# Patient Record
Sex: Female | Born: 1995 | Race: White | Hispanic: Yes | Marital: Single | Smoking: Never smoker
Health system: Southern US, Community
[De-identification: ages and names within clinical notes are randomized; demographics above are authoritative.]

## PROBLEM LIST (undated history)

## (undated) DIAGNOSIS — F29 Unspecified psychosis not due to a substance or known physiological condition: Secondary | ICD-10-CM

## (undated) DIAGNOSIS — F419 Anxiety disorder, unspecified: Secondary | ICD-10-CM

## (undated) DIAGNOSIS — F32A Depression, unspecified: Secondary | ICD-10-CM

## (undated) DIAGNOSIS — F329 Major depressive disorder, single episode, unspecified: Secondary | ICD-10-CM

## (undated) DIAGNOSIS — R5383 Other fatigue: Secondary | ICD-10-CM

## (undated) DIAGNOSIS — R519 Headache, unspecified: Secondary | ICD-10-CM

## (undated) DIAGNOSIS — R51 Headache: Secondary | ICD-10-CM

## (undated) HISTORY — DX: Major depressive disorder, single episode, unspecified: F32.9

## (undated) HISTORY — DX: Headache: R51

## (undated) HISTORY — DX: Headache, unspecified: R51.9

## (undated) HISTORY — DX: Anxiety disorder, unspecified: F41.9

## (undated) HISTORY — DX: Depression, unspecified: F32.A

## (undated) HISTORY — DX: Other fatigue: R53.83

---
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
## 2017-02-24 ENCOUNTER — Encounter (HOSPITAL_COMMUNITY): Payer: Self-pay | Admitting: *Deleted

## 2017-02-24 ENCOUNTER — Ambulatory Visit (INDEPENDENT_AMBULATORY_CARE_PROVIDER_SITE_OTHER): Payer: Managed Care, Other (non HMO) | Admitting: Medical

## 2017-02-24 ENCOUNTER — Encounter (HOSPITAL_COMMUNITY): Payer: Self-pay | Admitting: Medical

## 2017-02-24 VITALS — BP 116/70 | HR 94 | Resp 16 | Ht 60.5 in | Wt 163.0 lb

## 2017-02-24 DIAGNOSIS — F23 Brief psychotic disorder: Secondary | ICD-10-CM

## 2017-02-24 DIAGNOSIS — Z79899 Other long term (current) drug therapy: Secondary | ICD-10-CM | POA: Diagnosis not present

## 2017-02-24 NOTE — Progress Notes (Addendum)
[place] MD/PA/NP OP Progress Note  02/24/2017 5:52 PM Valerie [name]  MRN:  [id number]  Subjective:" I'm doing good"  Chief Complaint:  Chief Complaint    Follow-up; Schizoaffective DO     Visit Diagnosis:     ICD-9-CM ICD-10-CM   1. 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 Anxiety   . Depression   . Fatigue   . Headache    No past surgical history on file. Family History:   2 Paternal second cousins with autism Maternal 1st cousin with autism  Paternal 1st cousin with a tumor in his neck,     Social History  . Marital Status: Single    Spouse Name: N/A  . Number of Children: N/A  . Years of Education: N/A   Social History Main Topics  . Smoking status: Never Smoker   . Smokeless tobacco: None  . Alcohol Use: No  . Drug Use: No  . Sexual Activity: No   Other Topics Concern  . None   Social History Narrative   Social History:  Social History   Social History  . Marital status: Single    Spouse name: N/A  . Number of children: N/A  . Years of education: N/A   Social History Main Topics  . Smoking status: Never Smoker  . Smokeless tobacco: Never Used  . Alcohol use No  . Drug use: No  . Sexual activity: No   Other Topics Concern  . None   Social History Narrative  . None   Additional History:  Going to 11th grade at [employer] History:Noncontributory Prenatal History:   Birth History:   Postnatal Infancy:   Developmental History:   Milestones:  Sit-Up:    Crawl:   Walk:   Speech: School History: patient is behind 1 grade,   Psychiatric Specialty Exam: HPI : At last visit pt was here with her mother and there were no new psychiatric complaints/problems.She continued to do well in school and at home. She is going in her Senior yr in [place].She was having no extrapyramidal side effects.Wgt is stable She had tapered off her meds as directed  and was completely asymptomatic Continues ON OBCP So far pt has not had anproblems/advrse reaction   ROS No change Psychiatric/Behavioral: Positive for anxiety resolved  and  Hallucinations have stopped  No suicidal ideas, memory loss and substance abuse. The patient is not nervous/anxious and per HPI has stopped medications General: + for cessation of wgt gain.   All other systems reviewed and are negative.   Blood pressure 116/70, pulse 94, resp. rate 16, height 5' 0.5" (1.537 m), weight 163 lb (73.9 kg), SpO2 97 %.Body mass index is 31.31 kg/m.  General Appearance: Neat Mask like facies has disappered  Eye Contact:  Good  Speech:  Clear and Coherent  Volume:  Normal  Mood:  Happy/relaxed  Affect:  Appropriate and Congruent  Thought Process:  Coherent and Descriptions of Associations: Intact  Orientation:  Full (Time, Place, and Person)  Thought Content:  No SI/HI;Hallucination;Ideas of reference;Paranoia  Suicidal Thoughts:  No  Homicidal Thoughts:  No  Memory:  Negative Intact  Judgement:  Intact  Insight:  Fair  Psychomotor Activity:  Normal  Concentration:  Good  Recall:  Good  Fund of Knowledge: Good  Language: Good  Akathisia:  No  Handed:  Right  AIMS (if indicated):  Negative  Assets:  Desire for Improvement Financial Resources/Insurance Housing Social Support Transportation Vocational/Educational  ADL's:  Intact  Cognition: WNL  Sleep:  Normal   Musculoskeletal: Strength & Muscle Tone: within normal limits Gait & Station: normal Patient leans: N/A   Current Outpatient Prescriptions  Medication Sig Dispense Refill  . SPRINTEC 28 0.25-35 MG-MCG tablet     . ARIPiprazole (ABILIFY) 15 MG tablet Take 1 tablet at bedtime. (Patient not taking: Reported on 12/02/2016) 90 tablet 1  . escitalopram (LEXAPRO) 10 MG tablet Take 1 tablet (10 mg total) by mouth daily. (Patient not taking: Reported on 12/02/2016) 90 tablet 1   No current facility-administered medications  for this visit.     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

## 2017-05-06 ENCOUNTER — Telehealth (HOSPITAL_COMMUNITY): Payer: Self-pay | Admitting: Medical

## 2017-05-06 ENCOUNTER — Other Ambulatory Visit (HOSPITAL_COMMUNITY): Payer: Self-pay | Admitting: Medical

## 2017-05-06 DIAGNOSIS — F22 Delusional disorders: Secondary | ICD-10-CM

## 2017-05-06 NOTE — Telephone Encounter (Signed)
Pts medications was discontinued on last visit.  Mother is calling back requesting for a refill on medications because "pt is not acting normal"  Pt is not back in state until 7/23.   Please advise.

## 2017-05-06 NOTE — Telephone Encounter (Signed)
Pt needs to be seen Take to Psych ED for eval

## 2017-05-09 ENCOUNTER — Other Ambulatory Visit (HOSPITAL_COMMUNITY): Payer: Self-pay | Admitting: Medical

## 2017-05-09 NOTE — Progress Notes (Signed)
Pt referred to Psych ED

## 2017-05-09 NOTE — Telephone Encounter (Signed)
Lm for mom informing her of the following. Nothing further needed at this time.

## 2017-06-02 ENCOUNTER — Encounter (HOSPITAL_COMMUNITY): Payer: Self-pay | Admitting: *Deleted

## 2017-06-02 ENCOUNTER — Encounter (HOSPITAL_COMMUNITY): Payer: Self-pay | Admitting: Medical

## 2017-06-02 ENCOUNTER — Inpatient Hospital Stay (HOSPITAL_COMMUNITY)
Admission: RE | Admit: 2017-06-02 | Discharge: 2017-06-10 | DRG: 885 | Disposition: A | Discharge status: Home / Self Care | Payer: No Typology Code available for payment source | Attending: Psychiatry | Admitting: Psychiatry

## 2017-06-02 ENCOUNTER — Ambulatory Visit (INDEPENDENT_AMBULATORY_CARE_PROVIDER_SITE_OTHER): Payer: 59 | Admitting: Medical

## 2017-06-02 DIAGNOSIS — F419 Anxiety disorder, unspecified: Secondary | ICD-10-CM | POA: Diagnosis present

## 2017-06-02 DIAGNOSIS — F22 Delusional disorders: Secondary | ICD-10-CM

## 2017-06-02 DIAGNOSIS — Z818 Family history of other mental and behavioral disorders: Secondary | ICD-10-CM | POA: Diagnosis not present

## 2017-06-02 DIAGNOSIS — F29 Unspecified psychosis not due to a substance or known physiological condition: Secondary | ICD-10-CM | POA: Diagnosis not present

## 2017-06-02 DIAGNOSIS — F513 Sleepwalking [somnambulism]: Secondary | ICD-10-CM | POA: Diagnosis not present

## 2017-06-02 DIAGNOSIS — Z810 Family history of intellectual disabilities: Secondary | ICD-10-CM

## 2017-06-02 DIAGNOSIS — G4700 Insomnia, unspecified: Secondary | ICD-10-CM | POA: Diagnosis present

## 2017-06-02 DIAGNOSIS — F429 Obsessive-compulsive disorder, unspecified: Secondary | ICD-10-CM | POA: Diagnosis not present

## 2017-06-02 DIAGNOSIS — F332 Major depressive disorder, recurrent severe without psychotic features: Secondary | ICD-10-CM | POA: Diagnosis present

## 2017-06-02 DIAGNOSIS — Z8659 Personal history of other mental and behavioral disorders: Secondary | ICD-10-CM | POA: Diagnosis not present

## 2017-06-02 DIAGNOSIS — Z833 Family history of diabetes mellitus: Secondary | ICD-10-CM

## 2017-06-02 DIAGNOSIS — F819 Developmental disorder of scholastic skills, unspecified: Secondary | ICD-10-CM | POA: Diagnosis present

## 2017-06-02 DIAGNOSIS — Z658 Other specified problems related to psychosocial circumstances: Secondary | ICD-10-CM | POA: Diagnosis not present

## 2017-06-02 DIAGNOSIS — F333 Major depressive disorder, recurrent, severe with psychotic symptoms: Secondary | ICD-10-CM | POA: Diagnosis present

## 2017-06-02 DIAGNOSIS — Z8249 Family history of ischemic heart disease and other diseases of the circulatory system: Secondary | ICD-10-CM | POA: Diagnosis not present

## 2017-06-02 DIAGNOSIS — K219 Gastro-esophageal reflux disease without esophagitis: Secondary | ICD-10-CM | POA: Diagnosis present

## 2017-06-02 DIAGNOSIS — K5900 Constipation, unspecified: Secondary | ICD-10-CM | POA: Diagnosis present

## 2017-06-02 HISTORY — DX: Unspecified psychosis not due to a substance or known physiological condition: F29

## 2017-06-02 MED ORDER — RISPERIDONE 0.5 MG PO TBDP
0.5000 mg | ORAL_TABLET | Freq: Every day | ORAL | Status: DC
  Filled 2017-06-02 (×4): qty 1

## 2017-06-02 MED ORDER — MAGNESIUM HYDROXIDE 400 MG/5ML PO SUSP
5.0000 mL | Freq: Every evening | ORAL | Status: DC | PRN
  Administered 2017-06-06: 5 mL via ORAL
  Filled 2017-06-02: qty 30

## 2017-06-02 MED ORDER — ALUM & MAG HYDROXIDE-SIMETH 200-200-20 MG/5ML PO SUSP: 30.0000 mL | Freq: Four times a day (QID) | ORAL | Status: DC | PRN

## 2017-06-02 MED ORDER — NORGESTIMATE-ETH ESTRADIOL 0.25-35 MG-MCG PO TABS
1.0000 | ORAL_TABLET | Freq: Every day | ORAL | Status: DC
  Administered 2017-06-02 – 2017-06-09 (×8): 1 via ORAL

## 2017-06-02 NOTE — H&P (Signed)
Behavioral Health Medical Screening Exam  Valerie [name] is an 21 y.o. female.  Total Time spent with patient: 20 minutes  Psychiatric Specialty Exam: Physical Exam  Nursing note and vitals reviewed. Constitutional: She is oriented to person, place, and time. She appears well-developed and well-nourished.  Cardiovascular: Normal rate.   Respiratory: Effort normal.  Musculoskeletal: Normal range of motion.  Neurological: She is alert and oriented to person, place, and time.  Skin: Skin is warm.    Review of Systems  HENT: Negative.   Eyes: Negative.   Respiratory: Negative.   Cardiovascular: Negative.   Gastrointestinal: Negative.   Genitourinary: Negative.   Musculoskeletal: Negative.   Skin: Negative.   Neurological: Negative.   Endo/Heme/Allergies: Negative.     Blood pressure 119/75, pulse 99, temperature 98.9 F (37.2 C), SpO2 100 %.There is no height or weight on file to calculate BMI.  General Appearance: Casual  Eye Contact:  Good  Speech:  Clear and Coherent and Normal Rate  Volume:  Decreased  Mood:  Depressed  Affect:  Flat  Thought Process:  Coherent and Descriptions of Associations: Intact  Orientation:  Full (Time, Place, and Person)  Thought Content:  Hallucinations: Auditory Visual  Suicidal Thoughts:  No  Homicidal Thoughts:  No  Memory:  Immediate;   Fair Recent;   Fair  Judgement:  Fair  Insight:  Lacking  Psychomotor Activity:  Normal  Concentration: Concentration: Fair  Recall:  Good  Fund of Knowledge:Fair  Language: Good  Akathisia:  No  Handed:  Right  AIMS (if indicated):     Assets:  Financial Resources/Insurance Housing Social Support  Sleep:       Musculoskeletal: Strength & Muscle Tone: within normal limits Gait & Station: normal Patient leans: N/A  Blood pressure 119/75, pulse 99, temperature 98.9 F (37.2 C), SpO2 100 %.  Recommendations:  Based on my evaluation the patient does not appear to have an emergency medical  condition.  [place], FNP 06/02/2017, 3:59 PM

## 2017-06-02 NOTE — Tx Team (Signed)
Initial Treatment Plan 06/02/2017 5:57 PM Danna Sewell ZHG:[id number]    PATIENT STRESSORS: Loss of brother, 6 years ago   PATIENT STRENGTHS: Average or above average intelligence Special hobby/interest   PATIENT IDENTIFIED PROBLEMS: None per patient, states she is 20 and doesn't need to be here. Mom and Dad report change in sleep, poor sleep, and doesn't talk much and doesn't make sense.                     DISCHARGE CRITERIA:  Ability to meet basic life and health needs Improved stabilization in mood, thinking, and/or behavior Need for constant or close observation no longer present  PRELIMINARY DISCHARGE PLAN: Outpatient therapy  PATIENT/FAMILY INVOLVEMENT: This treatment plan has been presented to and reviewed with the patient, Trezure Cronk.  The patient and family have been given the opportunity to ask questions and make suggestions.  Davina Poke, RN 06/02/2017, 5:57 PM

## 2017-06-02 NOTE — Progress Notes (Signed)
Admission Note-Walk in accompanied by her parents after seeing her outpatient provider today and he recommended she come here to get on her medications again. Unclear what the medication is, patient nor parents could recall. She has been off of it for 2 months because the parents thought she was doing well and she had gained quite a lot of weight on it and she was unhappy about the weight gain. Parents provided most of the information as she was unable to clearly express self. She spoke in fragments only, and content was tangential. She initially states when asked why she was here she said she was 8 and didn't think she needed to be here. She had a significant loss of a 79 yo brother who died in a car accident 6 years ago. Parents noticed a change in her behavior 2 years ago. She had been taking medication successfully for nearly two years till stopping it 2 months ago. Parents report she sleeps poorly, difficulty staying asleep and urinates frequently. She is able to contract for safety. Dad states she is more irritable than usual and is anxious. Oriented to unit, unsure of her comprehension. She has not been hospitalized previously.

## 2017-06-02 NOTE — Progress Notes (Signed)
BH MD/PA/NP OP Progress Note  06/02/2017 11:18 AM Valerie [name]  MRN:  [id number]  Subjective:" I'm doing good"  Chief Complaint:  Chief Complaint    Recurrence of paranoid Psychosis; OC behaviors related to urination    HPI: HPI : At prior visit pt was here with her mother and there were no new psychiatric complaints/problems.She continued to do well in school and at home. She is going in her Senior yr in [place].She was having no extrapyramidal side effects.Wgt is stable She had tapered off her meds as directed and was completely asymptomatic  AT LAST VISIT:Subjective:" I'm doing good"  Continues ON OBCP So far pt has not had any problems/adverse reaction  Discharged Return PRN  TODAY: Pt returns with mother and unfortunately has had another psychotic break which began 3 weeks ago in [place] while visiting. Pt's mother did call at that time and was advised to take pt to Hospital but she hasnt been to date. Mom reports daughter has begun to have vegetative signs especially around hyd giene. She has OC signs around urge to urinatwe with negative exam for UTI/Diabetes.Pt reports hearing voices. Mom says she has been paranoid about Mom and she has refused to answer the phone when parents call to check on her.She does not sleep   Past Psychiatric History: Date of Evaluation:  03/20/2015 Referral Source: Dr. Selena Batten Chief Complaint:  depression with paranoia Visit Diagnosis: No diagnosis found. History of Present Illness:: Patient and father noted that 1st episode began in November with a few days of irritability, anxiety, depression, and anger. This resolved.  She has had two more severe episodes since then between February and now where she is irritable, short tempered, appears to be responding to internal stimulation, reporting auditory hallucinations, cursing at family. Her grades have dropped from As and Bs to possibly needing Summer school. Elements:  Location:  psychosis. Quality:   acute. Severity:  severe. Timing:  on going x 6 months. Duration:  6 months. Context:  auditory command hallucinations, irritability, mood changes with paranoia.. Associated Signs/Symptoms: Depression Symptoms:  depressed mood, anhedonia, insomnia, psychomotor agitation, fatigue, difficulty concentrating, hopelessness, anxiety, panic attacks, loss of energy/fatigue, disturbed sleep, (Hypo) Manic Symptoms:  Distractibility, Hallucinations, Anxiety Symptoms:  Excessive Worry, Panic Symptoms, Psychotic Symptoms:  Hallucinations: Auditory Command:  to hurt herself, never others, has never acted on them PTSD Symptoms: Had a traumatic exposure:  her brother died suddenly from an accident when she was 65  Visit Diagnosis:     ICD-10-CM   1. 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 History of OCD (obsessive compulsive disorder) Z86.59     Past Medical History:  Past Medical History:  Diagnosis Date  . Anxiety   . Depression   . Fatigue   . Headache    No past surgical history on file. Family History:   2 Paternal second cousins with autism Maternal 1st cousin with autism  Paternal 1st cousin with a tumor in his neck,     Social History  . Marital Status: Single    Spouse Name: N/A  . Number of Children: N/A  . Years of Education: N/A   Social History Main Topics  . Smoking status: Never Smoker   . Smokeless tobacco: None  . Alcohol Use: No  . Drug Use: No  . Sexual Activity: No   Other Topics Concern  . None   Social History Narrative   Social History:  Social History   Social History  . Marital status: Single  Spouse name: N/A  . Number of children: N/A  . Years of education: N/A   Social History Main Topics  . Smoking status: Never Smoker  . Smokeless tobacco: Never Used  . Alcohol use No  . Drug use: No  . Sexual activity: No   Other Topics Concern  . None   Social History Narrative  . None   Additional History:  Going to 11th grade at  [employer] History:Noncontributory Prenatal History:   Birth History:   Postnatal Infancy:   Developmental History:   Milestones:  Sit-Up:    Crawl:   Walk:   Speech: School History: patient is behind 1 grade,   Psychiatric Specialty Exam:      There were no vitals taken for this visit.There is no height or weight on file to calculate BMI.  General Appearance: Neat, Well Groomed and Mom had to assist Mask like facies continues to be absent  Eye Contact:  Minimal  Speech:  Blocked  Volume:  Minimal speeck  Mood:  Anxious, Dysphoric   Affect:  Congruent, Constricted and Restricted  Thought Process:  , NA unable to assess   Orientation:  Other:  person only  Thought Content:  No SI/HI;Hallucination;Ideas of reference;Paranoia  Suicidal Thoughts:  unable to assess  Homicidal Thoughts:  unable to assess  Memory:  Does not recall recent events Mom reports  Judgement:  Impaired  Insight:  Lacking  Psychomotor Activity:  Decreased and Restlessness  Concentration:  Poor  Recall:  ?  Fund of Knowledge: NA  Language: NA  Akathisia:  Negative  Handed:  Right  AIMS (if indicated):  Negative  Assets:  Merchandiser, retail Vocational/Educational  ADL's:  Impaired  Cognition: WNL and Impaired,  Severe  Sleep:  Not sleeping   Musculoskeletal: Strength & Muscle Tone: within normal limits Gait & Station: normal Patient leans: N/A   Current Outpatient Prescriptions  Medication Sig Dispense Refill  . SPRINTEC 28 0.25-35 MG-MCG tablet      No current facility-administered medications for this visit.      Treatment Plan Summary: Discussed options with Mom and also briefly with pt  Mom and Dad will take pt to Walk In at [facility] for consideration of Inpatient stabilization as she is not capable of taking care of herself and does not appear to be safe left on her own.    [name] 06/02/2017, 11:18 AM
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
## 2017-06-03 ENCOUNTER — Inpatient Hospital Stay (HOSPITAL_COMMUNITY)
Admission: RE | Admit: 2017-06-03 | Discharge: 2017-06-03 | Disposition: A | Discharge status: Home / Self Care | Payer: No Typology Code available for payment source | Source: Home / Self Care | Attending: Psychiatry | Admitting: Psychiatry

## 2017-06-03 ENCOUNTER — Encounter (HOSPITAL_COMMUNITY): Payer: Self-pay | Admitting: Psychiatry

## 2017-06-03 DIAGNOSIS — F29 Unspecified psychosis not due to a substance or known physiological condition: Secondary | ICD-10-CM

## 2017-06-03 DIAGNOSIS — F513 Sleepwalking [somnambulism]: Secondary | ICD-10-CM

## 2017-06-03 DIAGNOSIS — F429 Obsessive-compulsive disorder, unspecified: Secondary | ICD-10-CM

## 2017-06-03 DIAGNOSIS — Z658 Other specified problems related to psychosocial circumstances: Secondary | ICD-10-CM

## 2017-06-03 DIAGNOSIS — F333 Major depressive disorder, recurrent, severe with psychotic symptoms: Principal | ICD-10-CM

## 2017-06-03 HISTORY — DX: Unspecified psychosis not due to a substance or known physiological condition: F29

## 2017-06-03 LAB — CBC
HCT: 38.7 % (ref 36.0–46.0)
Hemoglobin: 12.8 g/dL (ref 12.0–15.0)
MCH: 27.1 pg (ref 26.0–34.0)
MCHC: 33.1 g/dL (ref 30.0–36.0)
MCV: 81.8 fL (ref 78.0–100.0)
PLATELETS: 306 10*3/uL (ref 150–400)
RBC: 4.73 MIL/uL (ref 3.87–5.11)
RDW: 13.9 % (ref 11.5–15.5)
WBC: 14.8 10*3/uL — ABNORMAL HIGH (ref 4.0–10.5)

## 2017-06-03 LAB — COMPREHENSIVE METABOLIC PANEL
ALK PHOS: 22 U/L — AB (ref 38–126)
ALT: 15 U/L (ref 14–54)
AST: 15 U/L (ref 15–41)
Albumin: 4 g/dL (ref 3.5–5.0)
Anion gap: 9 (ref 5–15)
BUN: 7 mg/dL (ref 6–20)
CALCIUM: 9.8 mg/dL (ref 8.9–10.3)
CO2: 25 mmol/L (ref 22–32)
CREATININE: 0.59 mg/dL (ref 0.44–1.00)
Chloride: 104 mmol/L (ref 101–111)
Glucose, Bld: 88 mg/dL (ref 65–99)
Potassium: 4.2 mmol/L (ref 3.5–5.1)
Sodium: 138 mmol/L (ref 135–145)
Total Bilirubin: 0.4 mg/dL (ref 0.3–1.2)
Total Protein: 7.8 g/dL (ref 6.5–8.1)

## 2017-06-03 LAB — URINALYSIS, ROUTINE W REFLEX MICROSCOPIC
BILIRUBIN URINE: NEGATIVE
GLUCOSE, UA: NEGATIVE mg/dL
HGB URINE DIPSTICK: NEGATIVE
Ketones, ur: NEGATIVE mg/dL
Leukocytes, UA: NEGATIVE
Nitrite: NEGATIVE
PROTEIN: NEGATIVE mg/dL
SPECIFIC GRAVITY, URINE: 1.016 (ref 1.005–1.030)
pH: 6 (ref 5.0–8.0)

## 2017-06-03 LAB — TSH: TSH: 2.863 u[IU]/mL (ref 0.350–4.500)

## 2017-06-03 LAB — PREGNANCY, URINE: PREG TEST UR: NEGATIVE

## 2017-06-03 LAB — HEMOGLOBIN A1C
HEMOGLOBIN A1C: 5.3 % (ref 4.8–5.6)
Mean Plasma Glucose: 105.41 mg/dL

## 2017-06-03 MED ORDER — TRAZODONE HCL 50 MG PO TABS
50.0000 mg | ORAL_TABLET | Freq: Every evening | ORAL | Status: DC | PRN
  Administered 2017-06-03 – 2017-06-06 (×4): 50 mg via ORAL
  Filled 2017-06-03 (×4): qty 1

## 2017-06-03 MED ORDER — ZIPRASIDONE HCL 20 MG PO CAPS
20.0000 mg | ORAL_CAPSULE | Freq: Two times a day (BID) | ORAL | Status: DC
  Administered 2017-06-03 – 2017-06-06 (×6): 20 mg via ORAL
  Filled 2017-06-03 (×11): qty 1

## 2017-06-03 MED ORDER — ESCITALOPRAM OXALATE 10 MG PO TABS
10.0000 mg | ORAL_TABLET | Freq: Every day | ORAL | Status: DC
  Administered 2017-06-03 – 2017-06-08 (×6): 10 mg via ORAL
  Filled 2017-06-03 (×9): qty 1

## 2017-06-03 NOTE — BHH Counselor (Signed)
Adult Comprehensive Assessment  Patient ID: Valerie [name], female   DOB: 1996-06-14, 21 y.o.   MRN: [id number]  Information Source: Information source:  (Patient's Mother Valerie [name]: [phone])  Current Stressors:  Educational / Learning stressors: N/A Employment / Job issues: N/A Family Relationships: Mother reports patient does well with her relationships with the family.  Financial / Lack of resources (include bankruptcy): N/A Housing / Lack of housing: N/A Physical health (include injuries & life threatening diseases): N/A Social relationships: Mother reports patient does not have many friends and she has not been talking to them lately Substance abuse: N/A Bereavement / Loss: N/A  Living/Environment/Situation:  Living Arrangements: Parent Living conditions (as described by patient or guardian): Patient lives in the home with her mother and father How long has patient lived in current situation?: Patient has been living wit hher mother all her life. All of her basic needs are met within the home.  What is atmosphere in current home: Supportive  Family History:  Marital status: Single Are you sexually active?: No What is your sexual orientation?: Heterosexual Has your sexual activity been affected by drugs, alcohol, medication, or emotional stress?: N/A Does patient have children?: No  Childhood History:  By whom was/is the patient raised?: Both parents Additional childhood history information: Mother reports there are no issues to report  Description of patient's relationship with caregiver when they were a child: Mother reports she has a good relationship with the patient. Mother reports normal teenage behavior from patient. Mother reports she may get mouthed back from patient if asked to clean her room, etc. Mother reports patient has a good relationship with her father.  Patient's description of current relationship with people who raised him/her: Good Normal Parents  daughter relationship How were you disciplined when you got in trouble as a child/adolescent?: Mother reports she would tell the patient to stop or put her on punishment if she was disrespectful.  Does patient have siblings?: No Did patient suffer any verbal/emotional/physical/sexual abuse as a child?: No Did patient suffer from severe childhood neglect?: No Has patient ever been sexually abused/assaulted/raped as an adolescent or adult?: No Was the patient ever a victim of a crime or a disaster?: No Witnessed domestic violence?: No Has patient been effected by domestic violence as an adult?: No  Education:  Highest grade of school patient has completed: 11 Currently a student?: Yes Name of school: [employer] Contact person:  (none given) How long has the patient attended?: 4 years  Learning disability?: Yes What learning problems does patient have?: Mother reports patient is in special classes for additional assistance.   Employment/Work Situation:   Employment situation: Radio broadcast assistant job has been impacted by current illness: No What is the longest time patient has a held a job?: N/A Where was the patient employed at that time?: N/A Has patient ever been in the [employer]?: No Has patient ever served in combat?: No Did You Receive Any Psychiatric Treatment/Services While in Passenger transport manager?: No Are There Guns or Other Weapons in [place]?: No Are These Psychologist, educational?:  (N/A)  Financial Resources:   Museum/gallery curator resources: Multimedia programmer Does patient have a Programmer, applications or guardian?: No  Alcohol/Substance Abuse:   What has been your use of drugs/alcohol within the last 12 months?: denies If attempted suicide, did drugs/alcohol play a role in this?: No Alcohol/Substance Abuse Treatment Hx: Denies past history Has alcohol/substance abuse ever caused legal problems?: No  Social Support System:   [employer]  Support System: Good Describe Community  Support System: Mother reports support from church Type of faith/religion: N/A How does patient's faith help to cope with current illness?: No  Leisure/Recreation:   Leisure and Hobbies: Mother reports patient loves to play soccer, dance, and sing.   Strengths/Needs:   What things does the patient do well?: Mother reports the patient plays soccer really well and colors really well.  In what areas does patient struggle / problems for patient: Mother reports the patient has a hard time focusing.   Discharge Plan:   Does patient have access to transportation?: Yes Will patient be returning to same living situation after discharge?: Yes Currently receiving community mental health services: Yes (From Whom) If no, would patient like referral for services when discharged?: Yes (What county?) ([place] or [place]) Does patient have financial barriers related to discharge medications?: No  Summary/Recommendations:   Summary and Recommendations (to be completed by the evaluator): 21 y.o. female who presents accompanied by her parents reporting symptoms of depression and psychosis. Mother reports the MD told her to take the patient to the hospital because patient was "talking backwards" and was forgetting information, wanted to clean stuff everyday, and the doctor was concerned. Mother reports she wants the patient's behavior to get better.   Raymondo Band. 06/03/2017

## 2017-06-03 NOTE — Tx Team (Signed)
Interdisciplinary Treatment and Diagnostic Plan Update  06/03/2017 Time of Session: 9:18 AM  Donald Jacque MRN: [id number]  Principal Diagnosis: <principal problem not specified>  Secondary Diagnoses: Active Problems:   MDD (major depressive disorder), recurrent episode, severe (HCC)   Current Medications:  Current Facility-Administered Medications  Medication Dose Route Frequency Provider Last Rate Last Dose  . alum & mag hydroxide-simeth (MAALOX/MYLANTA) 200-200-20 MG/5ML suspension 30 mL  30 mL Oral Q6H PRN Money, Darnelle Maffucci B, FNP      . magnesium hydroxide (MILK OF MAGNESIA) suspension 5 mL  5 mL Oral QHS PRN Money, Lowry Ram, FNP      . norgestimate-ethinyl estradiol (ORTHO-CYCLEN,SPRINTEC,PREVIFEM) 0.25-35 MG-MCG tablet 1 tablet  1 tablet Oral QHS Valda Lamb, Prentiss Bells, MD   1 tablet at 06/02/17 2037  . risperiDONE (RISPERDAL M-TABS) disintegrating tablet 0.5 mg  0.5 mg Oral QHS Money, Lowry Ram, FNP        PTA Medications: Prescriptions Prior to Admission  Medication Sig Dispense Refill Last Dose  . SPRINTEC 28 0.25-35 MG-MCG tablet Take 1 tablet by mouth daily.    06/01/2017 at Unknown time    Treatment Modalities: Medication Management, Group therapy, Case management,  1 to 1 session with clinician, Psychoeducation, Recreational therapy.   Physician Treatment Plan for Primary Diagnosis: <principal problem not specified> Long Term Goal(s): Improvement in symptoms so as ready for discharge  Short Term Goals: Ability to identify changes in lifestyle to reduce recurrence of condition will improve, Ability to verbalize feelings will improve, Ability to disclose and discuss suicidal ideas, Ability to demonstrate self-control will improve, Ability to identify and develop effective coping behaviors will improve and Ability to maintain clinical measurements within normal limits will improve  Medication Management: Evaluate patient's response, side effects, and tolerance of medication  regimen.  Therapeutic Interventions: 1 to 1 sessions, Unit Group sessions and Medication administration.  Evaluation of Outcomes: Not Met  Physician Treatment Plan for Secondary Diagnosis: Active Problems:   MDD (major depressive disorder), recurrent episode, severe ([place])   Long Term Goal(s): Improvement in symptoms so as ready for discharge  Short Term Goals: Ability to identify changes in lifestyle to reduce recurrence of condition will improve, Ability to verbalize feelings will improve, Ability to disclose and discuss suicidal ideas, Ability to demonstrate self-control will improve, Ability to identify and develop effective coping behaviors will improve and Ability to maintain clinical measurements within normal limits will improve  Medication Management: Evaluate patient's response, side effects, and tolerance of medication regimen.  Therapeutic Interventions: 1 to 1 sessions, Unit Group sessions and Medication administration.  Evaluation of Outcomes: Not Met   RN Treatment Plan for Primary Diagnosis: <principal problem not specified> Long Term Goal(s): Knowledge of disease and therapeutic regimen to maintain health will improve  Short Term Goals: Ability to remain free from injury will improve and Compliance with prescribed medications will improve  Medication Management: RN will administer medications as ordered by provider, will assess and evaluate patient's response and provide education to patient for prescribed medication. RN will report any adverse and/or side effects to prescribing provider.  Therapeutic Interventions: 1 on 1 counseling sessions, Psychoeducation, Medication administration, Evaluate responses to treatment, Monitor vital signs and CBGs as ordered, Perform/monitor CIWA, COWS, AIMS and Fall Risk screenings as ordered, Perform wound care treatments as ordered.  Evaluation of Outcomes: Not Met   LCSW Treatment Plan for Primary Diagnosis: <principal problem not  specified> Long Term Goal(s): Safe transition to appropriate next level of care at discharge, Engage patient  in therapeutic group addressing interpersonal concerns.  Short Term Goals: Engage patient in aftercare planning with referrals and resources, Increase ability to appropriately verbalize feelings, Facilitate acceptance of mental health diagnosis and concerns and Identify triggers associated with mental health/substance abuse issues  Therapeutic Interventions: Assess for all discharge needs, conduct psycho-educational groups, facilitate family session, explore available resources and support systems, collaborate with current community supports, link to needed community supports, educate family/caregivers on suicide prevention, complete Psychosocial Assessment.   Evaluation of Outcomes: Not Met   Progress in Treatment: Attending groups: Yes Participating in groups: Yes Taking medication as prescribed: Yes, MD continues to assess for medication changes as needed Toleration medication: Yes, no side effects reported at this time Family/Significant other contact made:  Patient understands diagnosis:  Discussing patient identified problems/goals with staff: Yes Medical problems stabilized or resolved: Yes Denies suicidal/homicidal ideation:  Issues/concerns per patient self-inventory: None Other: N/A  New problem(s) identified: None identified at this time.   New Short Term/Long Term Goal(s): None identified at this time.   Discharge Plan or Barriers:   8/10: Patient reports "my mom is what bought me into the hospital". Patient reports she does not know what she wants to work on while here in the hospital.   Reason for Continuation of Hospitalization: Psychosis  Depression Medication stabilization Suicidal ideation   Estimated Length of Stay: 3-5 days: Anticipated discharge date: 8/16  Attendees: Patient: Antonya Leeder 06/03/2017  9:18 AM  Physician: Hinda Kehr, MD  06/03/2017  9:18 AM  Nursing: Sharyn Lull, RN 06/03/2017  9:18 AM  RN Care Manager: Skipper Cliche, UR RN 06/03/2017  9:18 AM  Social Worker: Lucius Conn, [place] 06/03/2017  9:18 AM  Recreational Therapist: Ronald Lobo 06/03/2017  9:18 AM  Other:  06/03/2017  9:18 AM  Other:  06/03/2017  9:18 AM  Other: 06/03/2017  9:18 AM    Scribe for Treatment Team: Lucius Conn, [place] Worker [place] Ph: [phone]

## 2017-06-03 NOTE — Progress Notes (Signed)
Per electronic medical records and previous notes, patient was on Abilify 15 mg and Lexapro 10 mg.  These medications were stopped earlier in the year due to weight gain.  Risperdal was ordered on admission with no consent on chart.  Spoke to parents regarding medication and they would like to speak with MD prior to starting medications.

## 2017-06-03 NOTE — Progress Notes (Signed)
Recreation Therapy Notes   Date: 08.10.2018 Time: 1:30pm Location: [address] Dayroom   Group Topic: Decision Making   Goal Area(s) Addresses:  Patient will successfully make either or choice. Patient will accurately provide justification for choice.  Patient will follow instructions on 1st prompt.   Behavioral Response: Did not attend.   Laureen Ochs Rohaan Durnil, LRT/CTRS         Lane Hacker 06/03/2017 2:37 PM

## 2017-06-03 NOTE — [facility] Suicide Risk Assessment (Signed)
[facility] Admission Suicide Risk Assessment   Nursing information obtained from:  Patient Demographic factors:  Adolescent or young adult Current Mental Status:  NA Loss Factors:  NA Historical Factors:  Family history of mental illness or substance abuse Risk Reduction Factors:  Sense of responsibility to family, Positive social support, Positive therapeutic relationship  Total Time spent with patient: 20 minutes Principal Problem: Psychosis Diagnosis:   Patient Active Problem List   Diagnosis Date Noted  . Psychosis [F29] 06/03/2017    Priority: High  . MDD (major depressive disorder), recurrent episode, severe ([place]) [F33.2] 06/02/2017    Priority: High   Subjective Data: :I don't know"  Continued Clinical Symptoms:  Alcohol Use Disorder Identification Test Final Score (AUDIT): 0 The "Alcohol Use Disorders Identification Test", Guidelines for Use in Primary Care, Second Edition.  World [employer] [facility]). Score between 0-7:  no or low risk or alcohol related problems. Score between 8-15:  moderate risk of alcohol related problems. Score between 16-19:  high risk of alcohol related problems. Score 20 or above:  warrants further diagnostic evaluation for alcohol dependence and treatment.   CLINICAL FACTORS:   Currently Psychotic   Musculoskeletal: Strength & Muscle Tone: within normal limits Gait & Station: normal Patient leans: N/A  Psychiatric Specialty Exam: Physical Exam  Review of Systems  Gastrointestinal: Positive for constipation and heartburn. Negative for abdominal pain, diarrhea and vomiting.  Psychiatric/Behavioral: Positive for depression, hallucinations (guarded but endorses hearing some voices. Alos paranoid delusions of poinson on her food) and suicidal ideas (unclear, patient not reliable, or forthcoming). Negative for substance abuse. The patient is nervous/anxious and has insomnia.   All other systems reviewed and are negative.   Blood pressure  112/76, pulse (!) 107, temperature 97.8 F (36.6 C), temperature source Oral, resp. rate 16, height 5' 0.63" (1.54 m), weight 66.5 kg (146 lb 9.7 oz), SpO2 100 %.Body mass index is 28.04 kg/m.  General Appearance: Bizarre and Guarded  Eye Contact:  Good, piercing at times  Speech:  Clear and Coherent, Slow and Use at time mix of english and Spanish  Volume:  Decreased  Mood:  Anxious and Depressed  Affect:  Flat and seems suspicious  Thought Process:  Descriptions of Associations: Tangential disorganized.   Orientation:  Full (Time, Place, and Person)  Thought Content:  Delusions, Hallucinations: Auditory and Paranoid Ideation  Suicidal Thoughts:  Yes.  without intent/plan  Homicidal Thoughts:  No  Memory:  poor  Judgement:  Impaired  Insight:  Lacking  Psychomotor Activity:  Decreased  Concentration:  Concentration: Poor  Recall:  Poor  Fund of Knowledge:  Poor  Language:  Poor  Akathisia:  No  Handed:  Right  AIMS (if indicated):     Assets:  Financial Resources/Insurance Housing Physical Health Social Support  ADL's:  Intact  Cognition:  Seems concrete, seems to be presenting with some ID in top of her psychosis, difficult to asses  Sleep:         COGNITIVE FEATURES THAT CONTRIBUTE TO RISK:  Closed-mindedness and Polarized thinking    SUICIDE RISK:   Moderate:  Frequent suicidal ideation with limited intensity, and duration, some specificity in terms of plans, no associated intent, good self-control, limited dysphoria/symptomatology, some risk factors present, and identifiable protective factors, including available and accessible social support.  PLAN OF CARE: see admission note and plan.  I certify that inpatient services furnished can reasonably be expected to improve the patient's condition.   Philipp Ovens, MD 06/03/2017, 3:18  PM

## 2017-06-03 NOTE — Progress Notes (Signed)
Nursing Shift Note: Nursing Progress Note: 7-7p  D- Pt appears preoccupied and anxious," Sometimes I feel like some man is watching me." Affect is guarded and suspicious. Pt is able to contract for safety. Continues to have difficulty staying asleep. Goal for today is to tell why she's here  A - Observed pt select  interaction in group and in the milieu.Support and encouragement offered, safety maintained with q 15 minutes.Pt is silly at times, stares off at times.  R-Contracts for safety and continues to follow treatment plan, working on learning to accept the need for medication. Educated pt on Lexapro, Geodon also Ct scan of head
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
## 2017-06-03 NOTE — Progress Notes (Signed)
Recreation Therapy Notes  INPATIENT RECREATION THERAPY ASSESSMENT  Patient Details Name: Valerie [name] MRN: [id number] DOB: 10-15-1996 Today's Date: 06/03/2017  LRT attempted to assess patient, however patient appeared internally preoccupied and demonstrated thought blocking. Patient was able to answer questions about leisure interests, specifically that she enjoys soccer and running. When asked about her family patient stated she could not talk to LRT about her family. LRT will continue to attempted to assess patient during patient admission.   Laureen Ochs Jessic Standifer, LRT/CTRS  Helvi Royals L 06/03/2017, 3:12 PM

## 2017-06-03 NOTE — [facility] Group Notes (Signed)
[place] LCSW Group Therapy Note  Date/Time:06/03/2017  9:26 AM   Type of Therapy and Topic:  Group Therapy:  Overcoming Obstacles  Participation Level:  active   Description of Group:    In this group patients will be encouraged to explore what they see as obstacles to their own wellness and recovery. They will be guided to discuss their thoughts, feelings, and behaviors related to these obstacles. The group will process together ways to cope with barriers, with attention given to specific choices patients can make. Each patient will be challenged to identify changes they are motivated to make in order to overcome their obstacles. This group will be process-oriented, with patients participating in exploration of their own experiences as well as giving and receiving support and challenge from other group members.  Therapeutic Goals: 1. Patient will identify personal and current obstacles as they relate to admission. 2. Patient will identify barriers that currently interfere with their wellness or overcoming obstacles.  3. Patient will identify feelings, thought process and behaviors related to these barriers. 4. Patient will identify two changes they are willing to make to overcome these obstacles:    Summary of Patient Progress Group members participated in this activity by defining obstacles and exploring feelings related to obstacles. Group members discussed examples of positive and negative obstacles. Group members identified the obstacle they feel most related to their admission and processed what they could do to overcome and what motivates them to accomplish this goal.     Therapeutic Modalities:   Cognitive Behavioral Therapy Solution Focused Therapy Motivational Interviewing Relapse Prevention Therapy  [place] MSW, LCSW

## 2017-06-04 LAB — CBC WITH DIFFERENTIAL/PLATELET
BASOS PCT: 0 %
Basophils Absolute: 0 10*3/uL (ref 0.0–0.1)
EOS ABS: 0.2 10*3/uL (ref 0.0–0.7)
EOS PCT: 2 %
HEMATOCRIT: 36.7 % (ref 36.0–46.0)
Hemoglobin: 12.3 g/dL (ref 12.0–15.0)
Lymphocytes Relative: 35 %
Lymphs Abs: 4.2 10*3/uL — ABNORMAL HIGH (ref 0.7–4.0)
MCH: 26.9 pg (ref 26.0–34.0)
MCHC: 33.5 g/dL (ref 30.0–36.0)
MCV: 80.3 fL (ref 78.0–100.0)
MONO ABS: 0.6 10*3/uL (ref 0.1–1.0)
MONOS PCT: 5 %
NEUTROS ABS: 6.8 10*3/uL (ref 1.7–7.7)
Neutrophils Relative %: 58 %
PLATELETS: 283 10*3/uL (ref 150–400)
RBC: 4.57 MIL/uL (ref 3.87–5.11)
RDW: 13.9 % (ref 11.5–15.5)
WBC: 11.9 10*3/uL — ABNORMAL HIGH (ref 4.0–10.5)

## 2017-06-04 LAB — LIPID PANEL
Cholesterol: 172 mg/dL — ABNORMAL HIGH (ref 0–169)
HDL: 69 mg/dL (ref >40)
LDL CALC: 74 mg/dL (ref 0–99)
TRIGLYCERIDES: 144 mg/dL (ref <150)
Total CHOL/HDL Ratio: 2.5 RATIO
VLDL: 29 mg/dL (ref 0–40)

## 2017-06-04 NOTE — Progress Notes (Signed)
Patient ID: Valerie [name], female   DOB: [birth year], 21 y.o.   MRN: [id number] In to give HS medications. Took medications without any problems. Stated she was homesick. Reports that she lives with mom and dad and will be a senior in high school. Reports, " I just want to go to sleep." support provided.

## 2017-06-04 NOTE — BHH Group Notes (Signed)
[facility] LCSW Group Therapy Note  Date/Time 06/04/17 10:00AM  Type of Therapy and Topic:  Group Therapy:  Cognitive Distortions  Participation Level:  Active   Description of Group:    Patients in this group will be introduced to the topic of cognitive distortions.  Patients will identify and describe cognitive distortions, describe the feelings these distortions create for them.  Patients will identify one or more situations in their personal life where they have cognitively distorted thinking and will verbalize challenging this cognitive distortion through positive thinking skills.  Patients will practice the skill of using positive affirmations to challenge cognitive distortions using affirmation cards.    Therapeutic Goals:  1. Patient will identify two or more cognitive distortions they have used 2. Patient will identify one or more emotions that stem from use of a cognitive distortion 3. Patient will demonstrate use of a positive affirmation to counter a cognitive distortion through discussion and/or role play. 4. Patient will describe one way cognitive distortions can be detrimental to wellness   Summary of Patient Progress: Patient was able to identify her distorted thinking through activities with sports. Patient has a passion for soccer and can more openly share about examples through soccer and experiences with sports.     Therapeutic Modalities:   Cognitive Behavioral Therapy Motivational Interviewing   Christene Lye MSW, LCSW

## 2017-06-04 NOTE — Progress Notes (Signed)
Nursing Shift Note: Appearance looks disheveled, states she did shower appears questionable. Pt is compliant with medication but needs prompting, staring at medication. Encouraged to take as scheduled and stressed medication compliance     A - Little interaction in group and in the milieu Pt doesn't initiate conversation.Support and encouragement offered, safety maintained with q 15 minutes. Pt more animated while in gym playing ball  R-Contracts for safety and continues to follow treatment plan, working on learning new coping skills.  Marland Kitchen

## 2017-06-04 NOTE — Progress Notes (Signed)
Child/Adolescent Psychoeducational Group Note  Date:  06/04/2017 Time:  1:20 PM  Group Topic/Focus:  Goals Group:   The focus of this group is to help patients establish daily goals to achieve during treatment and discuss how the patient can incorporate goal setting into their daily lives to aide in recovery.  Participation Level:  Minimal  Participation Quality:  Attentive  Affect:  Flat  Cognitive:  Confused  Insight:  Lacking  Engagement in Group:  Limited  Modes of Intervention:  Discussion  Additional Comments:  Pt stated that her goal for the day to work on communication with her mother. Pt stated that sometimes she gets into arguments with her mother. Pt denies SI and Hi. Pt contracts for safety.   Valerie [name] 06/04/2017, 1:20 PM

## 2017-06-04 NOTE — Progress Notes (Signed)
[facility] MD Progress Note  06/04/2017 7:57 AM Valerie [name]  MRN:  [id number] Subjective:  "alright" Patient seen by this MD, case discussed with nursing and chart reviewed. As per nursing, patient just today he seems preoccupied and anxious, verbalized some time and feeling like some maintenance watching me, affect is guarded and suspicious. Able to contract for safety in the unit. Trouble staying asleep. During evaluation in the unit patient remained with significant thought blocking, seems responding to internal stimuli and preoccupied with her environment and internal stimuli. She is not forthcoming with information base able to verbalize that she is hearing the liter of voices, remains preoccupied with other people wanting to hurt her, and is specific punching her, is still endorse that she is a leader preoccupied with people poisoning her food, continues to endorse sadness, worthlessness and hopelessness. She endorses eating some eczema none at this morning, reported his sleeping on and off last night, seems not to better stated and being compliant with medication but hadn't been compliant here in the hospital. Denies any acute side effects, does not seem oversedated and no stiffness on physical exam. Patient remains with very minimal appropriate interaction, significant thought blocking that make her to takes long pases before answering any questions. Patient any suicidal ideation but her responses are not reliable and remained tangential when answering questions. As per review of record patient received trazodone 50 mg at bedtime at 8:38 PM last night. Principal Problem: Psychosis Diagnosis:   Patient Active Problem List   Diagnosis Date Noted  . Psychosis [F29] 06/03/2017    Priority: High  . MDD (major depressive disorder), recurrent episode, severe ([place]) [F33.2] 06/02/2017    Priority: High   Total Time spent with patient: 30 minutes  Past Psychiatric History:              Outpatient:  Seeing Pediatric NP, Dian Situ; Father denies any past outpatient therapy              Inpatient: None              Past medication trial:abilify and lexapro, with good response, dc due to insurance lack and increase on weight with abilify.              Past SA: None                           Psychological testing: None  Medical Problems:             Allergies: Sulfa Antibiotics             Surgeries: None             Head trauma:None             STD: None   Family Psychiatric history: None  Past Medical History:  Past Medical History:  Diagnosis Date  . Anxiety   . Depression   . Fatigue   . Headache   . Psychosis 06/03/2017   History reviewed. No pertinent surgical history. Family History: History reviewed. No pertinent family history.  Social History:  History  Alcohol Use No     History  Drug Use No    Social History   Social History  . Marital status: Single    Spouse name: N/A  . Number of children: N/A  . Years of education: N/A   Social History Main Topics  . Smoking status: Never Smoker  . Smokeless tobacco: Never  Used  . Alcohol use No  . Drug use: No  . Sexual activity: No   Other Topics Concern  . None   Social History Narrative  . None   Additional Social History:    Pain Medications: not abusing Prescriptions: not abusing Over the Counter: not abusing History of alcohol / drug use?: No history of alcohol / drug abuse Longest period of sobriety (when/how long): denies Negative Consequences of Use:  (denies) Withdrawal Symptoms:  (denies)        Current Medications: Current Facility-Administered Medications  Medication Dose Route Frequency Provider Last Rate Last Dose  . alum & mag hydroxide-simeth (MAALOX/MYLANTA) 200-200-20 MG/5ML suspension 30 mL  30 mL Oral Q6H PRN Money, Darnelle Maffucci B, FNP      . escitalopram (LEXAPRO) tablet 10 mg  10 mg Oral Daily Valda Lamb, Prentiss Bells, MD   10 mg at 06/03/17 1730  .  magnesium hydroxide (MILK OF MAGNESIA) suspension 5 mL  5 mL Oral QHS PRN Money, Lowry Ram, FNP      . norgestimate-ethinyl estradiol (ORTHO-CYCLEN,SPRINTEC,PREVIFEM) 0.25-35 MG-MCG tablet 1 tablet  1 tablet Oral QHS Valda Lamb, Prentiss Bells, MD   1 tablet at 06/03/17 2038  . traZODone (DESYREL) tablet 50 mg  50 mg Oral QHS PRN Valda Lamb, Prentiss Bells, MD   50 mg at 06/03/17 2038  . ziprasidone (GEODON) capsule 20 mg  20 mg Oral BID WC Valda Lamb, Prentiss Bells, MD   20 mg at 06/03/17 1730    Lab Results:  Results for orders placed or performed during the hospital encounter of 06/02/17 (from the past 48 hour(s))  Comprehensive metabolic panel     Status: Abnormal   Collection Time: 06/03/17  7:25 AM  Result Value Ref Range   Sodium 138 135 - 145 mmol/L   Potassium 4.2 3.5 - 5.1 mmol/L   Chloride 104 101 - 111 mmol/L   CO2 25 22 - 32 mmol/L   Glucose, Bld 88 65 - 99 mg/dL   BUN 7 6 - 20 mg/dL   Creatinine, Ser 0.59 0.44 - 1.00 mg/dL   Calcium 9.8 8.9 - 10.3 mg/dL   Total Protein 7.8 6.5 - 8.1 g/dL   Albumin 4.0 3.5 - 5.0 g/dL   AST 15 15 - 41 U/L   ALT 15 14 - 54 U/L   Alkaline Phosphatase 22 (L) 38 - 126 U/L   Total Bilirubin 0.4 0.3 - 1.2 mg/dL   GFR calc non Af Amer >60 >60 mL/min   GFR calc Af Amer >60 >60 mL/min    Comment: (NOTE) The eGFR has been calculated using the CKD EPI equation. This calculation has not been validated in all clinical situations. eGFR's persistently <60 mL/min signify possible Chronic Kidney Disease.    Anion gap 9 5 - 15    Comment: Performed at [facility], [place] [address] [place], [place] [address]  Hemoglobin A1c     Status: None   Collection Time: 06/03/17  7:25 AM  Result Value Ref Range   Hgb A1c MFr Bld 5.3 4.8 - 5.6 %    Comment: (NOTE) Pre diabetes:          5.7%-6.4% Diabetes:              >6.4% Glycemic control for   <7.0% adults with diabetes    Mean Plasma Glucose 105.41 mg/dL    Comment: Performed at  [facility] [address] [place], [place] [address]  CBC  Status: Abnormal   Collection Time: 06/03/17  7:25 AM  Result Value Ref Range   WBC 14.8 (H) 4.0 - 10.5 K/uL   RBC 4.73 3.87 - 5.11 MIL/uL   Hemoglobin 12.8 12.0 - 15.0 g/dL   HCT 38.7 36.0 - 46.0 %   MCV 81.8 78.0 - 100.0 fL   MCH 27.1 26.0 - 34.0 pg   MCHC 33.1 30.0 - 36.0 g/dL   RDW 13.9 11.5 - 15.5 %   Platelets 306 150 - 400 K/uL    Comment: Performed at [facility], [place] [address] [place], [place] [address]  TSH     Status: None   Collection Time: 06/03/17  7:25 AM  Result Value Ref Range   TSH 2.863 0.350 - 4.500 uIU/mL    Comment: Performed by a 3rd Generation assay with a functional sensitivity of <=0.01 uIU/mL. Performed at [facility], [place] [address] [place], [place] [address]   Pregnancy, urine     Status: None   Collection Time: 06/03/17  2:09 PM  Result Value Ref Range   Preg Test, Ur NEGATIVE NEGATIVE    Comment:        THE SENSITIVITY OF THIS METHODOLOGY IS >20 mIU/mL. Performed at [facility], [place] [address], [place], [place] [address]   Urinalysis, Routine w reflex microscopic     Status: Abnormal   Collection Time: 06/03/17  2:09 PM  Result Value Ref Range   Color, Urine STRAW (A) YELLOW   APPearance CLEAR CLEAR   Specific Gravity, Urine 1.016 1.005 - 1.030   pH 6.0 5.0 - 8.0   Glucose, UA NEGATIVE NEGATIVE mg/dL   Hgb urine dipstick NEGATIVE NEGATIVE   Bilirubin Urine NEGATIVE NEGATIVE   Ketones, ur NEGATIVE NEGATIVE mg/dL   Protein, ur NEGATIVE NEGATIVE mg/dL   Nitrite NEGATIVE NEGATIVE   Leukocytes, UA NEGATIVE NEGATIVE    Comment: Performed at [facility] [address] [place], [place] [address]  CBC with Differential/Platelet     Status: Abnormal   Collection Time: 06/04/17  7:03 AM  Result Value Ref Range   WBC 11.9 (H) 4.0 - 10.5 K/uL   RBC 4.57 3.87 - 5.11 MIL/uL   Hemoglobin 12.3 12.0  - 15.0 g/dL   HCT 36.7 36.0 - 46.0 %   MCV 80.3 78.0 - 100.0 fL   MCH 26.9 26.0 - 34.0 pg   MCHC 33.5 30.0 - 36.0 g/dL   RDW 13.9 11.5 - 15.5 %   Platelets 283 150 - 400 K/uL   Neutrophils Relative % 58 %   Neutro Abs 6.8 1.7 - 7.7 K/uL   Lymphocytes Relative 35 %   Lymphs Abs 4.2 (H) 0.7 - 4.0 K/uL   Monocytes Relative 5 %   Monocytes Absolute 0.6 0.1 - 1.0 K/uL   Eosinophils Relative 2 %   Eosinophils Absolute 0.2 0.0 - 0.7 K/uL   Basophils Relative 0 %   Basophils Absolute 0.0 0.0 - 0.1 K/uL    Comment: Performed at [facility], [place] [address] [place], [place] [address]    Blood Alcohol level:  No results found for: [facility]  Metabolic Disorder Labs: Lab Results  Component Value Date   HGBA1C 5.3 06/03/2017   MPG 105.41 06/03/2017   No results found for: PROLACTIN No results found for: CHOL, TRIG, HDL, CHOLHDL, VLDL, LDLCALC  Physical Findings: AIMS: Facial and Oral Movements Muscles of Facial Expression: None, normal Lips and Perioral Area:  None, normal Jaw: None, normal Tongue: None, normal,Extremity Movements Upper (arms, wrists, hands, fingers): None, normal Lower (legs, knees, ankles, toes): None, normal, Trunk Movements Neck, shoulders, hips: None, normal, Overall Severity Severity of abnormal movements (highest score from questions above): None, normal Incapacitation due to abnormal movements: None, normal Patient's awareness of abnormal movements (rate only patient's report): No Awareness, Dental Status Current problems with teeth and/or dentures?: No Does patient usually wear dentures?: No  CIWA:    COWS:     Musculoskeletal: Strength & Muscle Tone: within normal limits Gait & Station: normal Patient leans: N/A  Psychiatric Specialty Exam: Physical Exam  Review of Systems  Constitutional: Negative for malaise/fatigue.  Cardiovascular: Negative for chest pain and palpitations.  Gastrointestinal: Negative for abdominal pain,  constipation, diarrhea, heartburn, nausea and vomiting.  Genitourinary: Negative for dysuria, frequency, hematuria and urgency.  Neurological: Negative for dizziness, tingling, tremors, sensory change and headaches.  Psychiatric/Behavioral: Positive for depression and hallucinations. Negative for substance abuse and suicidal ideas. The patient is nervous/anxious and has insomnia.   All other systems reviewed and are negative.   Blood pressure (!) 107/55, pulse 83, temperature 98.3 F (36.8 C), temperature source Oral, resp. rate 16, height 5' 0.63" (1.54 m), weight 66.5 kg (146 lb 9.7 oz), SpO2 100 %.Body mass index is 28.04 kg/m.  General Appearance: Disheveled, appears to be responding to internal stimuli, some inappropriate laughing, thought blocking with long pauses and the interaction, seems anxious and suspicious   Eye Contact:  Fair  Speech:  Clear and Coherent and Slow  Volume:  Decreased  Mood:  Anxious, Depressed, Hopeless and Worthless  Affect:  Congruent and Flat, anxious and suspicious   Thought Process:  Descriptions of Associations: Circumstantial thought blocking, seems to be responding to internal his height   Orientation:  Other:  person  Thought Content:  Hallucinations: Auditory and Paranoid Ideation  Suicidal Thoughts:  No, unreliable, responded with tangential TP but contracting for safety in the unit  Homicidal Thoughts:  No unreliable, responded with tangential TP  Memory:  poor  Judgement:  Impaired  Insight:  Lacking  Psychomotor Activity:  Decreased  Concentration:  Concentration: Poor  Recall:  Poor  Fund of Knowledge:  Poor  Language:  Fair  Akathisia:  No  Handed:  Right  AIMS (if indicated):     Assets:  Catering manager Housing Physical Health Social Support  ADL's:  Impaired  Cognition:  Impaired,  Mild, seems having LD versus ID as baseline, unclear due to current psychosis  Sleep:       Treatment Plan Summary: - Daily contact  with patient to assess and evaluate symptoms and progress in treatment and Medication management -Safety:  Patient contracts for safety on the unit, To continue every 15 minute checks - Labs reviewed , normal head CT, prolactin and lipid panel pending, WBC improve 11.9 from 14.8. - To reduce current symptoms to base line and improve the patient's overall level of functioning will adjust Medication management as follow: Psychosis, rule out schizophrenia versus MDD with psychosis, will monitor response starting last night Geodon 20 mg twice a day. Patient have history of responding well to Abilify but significant weight gain caused that patient  refused the medication. We will monitor for oversedation, acute dystonia, akathisia or stiffness. MDD, we will monitor respond to restart Lexapro 10 mg daily. Monitor GI symptoms or over activation Insomnia, monitor respond to trazodone 50 mg as needed. Monitor for daytime sedation Patient denies any constipation or GERD  today but will continue to monitor since she is not too cooperative with information today.  - Therapy: Patient to continue to participate in group therapy, family therapies, communication skills training, separation and individuation therapies, coping skills training. - Social worker to contact family to further obtain collateral along with setting of family therapy and outpatient treatment at the time of discharge.   Philipp Ovens, MD 06/04/2017, 7:57 AM

## 2017-06-04 NOTE — Progress Notes (Signed)
Patient ID: Valerie [name], female   DOB: 08-Apr-1996, 21 y.o.   MRN: [id number] In bed, 1:1 with pt. Reports "tired." offered snack, stated not hungry. 240 water cc consumed. Up to use the restroom. Denies si/hi/pain. Denies AV hallucinations. Appears suspicious. Support provided. Will continue to closely monitor.

## 2017-06-05 NOTE — Progress Notes (Signed)
Patient ID: Valerie [name], female   DOB: 1996/06/24, 21 y.o.   MRN: [id number] [place], flat. Appears suspicious at times of medication and food. Reports hearing voices currently. Asked what she hears, replied "they are telling me to play sports." medications taken as ordered. Ate snack, watched movie with peers and staff. Went to sleep without any issues after prn trazodone given. Denies si/hi/pain. Safety maintained

## 2017-06-05 NOTE — BHH Group Notes (Signed)
[facility] LCSW Group Therapy Note  Date/Time: 06/05/17 10:00 AM   Type of Therapy and Topic:  Group Therapy:  Who Am I?  Self Esteem, Self-Actualization and Understanding Self.    Participation Level:  Minimal  Description of Group:    In this group patients will be asked to explore values, beliefs, truths, and morals as they relate to personal self.  Patients will be guided to discuss their thoughts, feelings, and behaviors related to what they identify as important to their true self. Patients will process together how values, beliefs and truths are connected to specific choices patients make every day. Each patient will be challenged to identify changes that they are motivated to make in order to improve self-esteem and self-actualization. This group will be process-oriented, with patients participating in exploration of their own experiences as well as giving and receiving support and challenge from other group members.   Therapeutic Goals: 1. Patient will identify false beliefs that currently interfere with their self-esteem.  2. Patient will identify feelings, thought process, and behaviors related to self and will become aware of the uniqueness of themselves and of others.  3. Patient will be able to identify and verbalize values, morals, and beliefs as they relate to self. 4. Patient will begin to learn how to build self-esteem/self-awareness by expressing what is important and unique to them personally.   Summary of Patient Progress Group members engaged in discussion about self-esteem. Group members explored their own thoughts and feelings about self. Group members discussed what external and internal factors effect one's self esteem. Group members discussed connection of thoughts, feelings and behaviors to one's self esteem. Patient was able to identify that she would dance with her friends in order to stay connected to them.      Therapeutic Modalities:   Cognitive Behavioral  Therapy Solution Focused Therapy Motivational Interviewing Brief Therapy  Christene Lye MSW, LCSW

## 2017-06-05 NOTE — Progress Notes (Signed)
[facility] MD Progress Note  06/05/2017 8:19 AM Valerie [name]  MRN:  [id number] Subjective:  "alright" Patient seen by this MD, case discussed with nursing and chart reviewed. As per nursing,In to give HS medications. Took medications without any problems. Stated she was homesick. Reports that she lives with mom and dad and will be a senior in high school. Reports, " I just want to go to sleep.In bed, 1:1 with pt. Reports "tired." offered snack, stated not hungry. 240 water cc consumed. Up to use the restroom. Denies si/hi/pain. Denies AV hallucinations. Appears suspicious. As per day shift nursing: Appearance looks disheveled, states she did shower appears questionable. Pt is compliant with medication but needs prompting, staring at medication. Encouraged to take as scheduled and stressed medication compliance     During evaluation in the unit patient patient's symptoms and no acute distress but presents with more thought blocking and seems to be responding to internal stimuli. Take very long pauses to answer questions and seems more disorganized today. She reported she wants to go home, endorse a having a good day yesterday, good visitation with mom and dad. She told this M.D. out of the blue "I think you are  going to be a mom". She also say when questioned about hearing voices and taking long past to verbalize thoughts: "I am thinking too many things". Her thought process seems block and disorganized. She reported she enjoyed the day yesterday because in the gym  she was dancing some [place] dance. She reported eating okay and sleeping okay. She was found sitting on her room with the lights off and staring at the walls. She remained with disheveled hair. Nursing aware to monitor ADLs. She denies any thoughts of depression or wanting to end her life. She is not fully reliable due to her level of disorganization. She was educated about the plan to increase her medication tomorrow and to discuss with nursing if any  side effects.   Principal Problem: Psychosis Diagnosis:   Patient Active Problem List   Diagnosis Date Noted  . Psychosis [F29] 06/03/2017    Priority: High  . MDD (major depressive disorder), recurrent episode, severe ([place]) [F33.2] 06/02/2017    Priority: High   Total Time spent with patient: 30 minutes  Past Psychiatric History:              Outpatient: Seeing Pediatric NP, Dian Situ; Father denies any past outpatient therapy              Inpatient: None              Past medication trial:abilify and lexapro, with good response, dc due to insurance lack and increase on weight with abilify.              Past SA: None                           Psychological testing: None  Medical Problems:             Allergies: Sulfa Antibiotics             Surgeries: None             Head trauma:None             STD: None   Family Psychiatric history: None  Past Medical History:  Past Medical History:  Diagnosis Date  . Anxiety   . Depression   . Fatigue   . Headache   .  Psychosis 06/03/2017   History reviewed. No pertinent surgical history. Family History: History reviewed. No pertinent family history.  Social History:  History  Alcohol Use No     History  Drug Use No    Social History   Social History  . Marital status: Single    Spouse name: N/A  . Number of children: N/A  . Years of education: N/A   Social History Main Topics  . Smoking status: Never Smoker  . Smokeless tobacco: Never Used  . Alcohol use No  . Drug use: No  . Sexual activity: No   Other Topics Concern  . None   Social History Narrative  . None   Additional Social History:    Pain Medications: not abusing Prescriptions: not abusing Over the Counter: not abusing History of alcohol / drug use?: No history of alcohol / drug abuse Longest period of sobriety (when/how long): denies Negative Consequences of Use:  (denies) Withdrawal Symptoms:  (denies)        Current  Medications: Current Facility-Administered Medications  Medication Dose Route Frequency Provider Last Rate Last Dose  . alum & mag hydroxide-simeth (MAALOX/MYLANTA) 200-200-20 MG/5ML suspension 30 mL  30 mL Oral Q6H PRN Money, Lowry Ram, FNP      . escitalopram (LEXAPRO) tablet 10 mg  10 mg Oral Daily Valda Lamb, Prentiss Bells, MD   10 mg at 06/04/17 0802  . magnesium hydroxide (MILK OF MAGNESIA) suspension 5 mL  5 mL Oral QHS PRN Money, Lowry Ram, FNP      . norgestimate-ethinyl estradiol (ORTHO-CYCLEN,SPRINTEC,PREVIFEM) 0.25-35 MG-MCG tablet 1 tablet  1 tablet Oral QHS Valda Lamb, Prentiss Bells, MD   1 tablet at 06/04/17 2014  . traZODone (DESYREL) tablet 50 mg  50 mg Oral QHS PRN Valda Lamb, Prentiss Bells, MD   50 mg at 06/04/17 2016  . ziprasidone (GEODON) capsule 20 mg  20 mg Oral BID WC Valda Lamb, Danaka Llera, MD   20 mg at 06/04/17 1651    Lab Results:  Results for orders placed or performed during the hospital encounter of 06/02/17 (from the past 48 hour(s))  Pregnancy, urine     Status: None   Collection Time: 06/03/17  2:09 PM  Result Value Ref Range   Preg Test, Ur NEGATIVE NEGATIVE    Comment:        THE SENSITIVITY OF THIS METHODOLOGY IS >20 mIU/mL. Performed at [facility], [place] [address], [place], [place] [address]   Urinalysis, Routine w reflex microscopic     Status: Abnormal   Collection Time: 06/03/17  2:09 PM  Result Value Ref Range   Color, Urine STRAW (A) YELLOW   APPearance CLEAR CLEAR   Specific Gravity, Urine 1.016 1.005 - 1.030   pH 6.0 5.0 - 8.0   Glucose, UA NEGATIVE NEGATIVE mg/dL   Hgb urine dipstick NEGATIVE NEGATIVE   Bilirubin Urine NEGATIVE NEGATIVE   Ketones, ur NEGATIVE NEGATIVE mg/dL   Protein, ur NEGATIVE NEGATIVE mg/dL   Nitrite NEGATIVE NEGATIVE   Leukocytes, UA NEGATIVE NEGATIVE    Comment: Performed at [facility] [address], [place], [place] [address]  CBC with Differential/Platelet      Status: Abnormal   Collection Time: 06/04/17  7:03 AM  Result Value Ref Range   WBC 11.9 (H) 4.0 - 10.5 K/uL   RBC 4.57 3.87 - 5.11 MIL/uL   Hemoglobin 12.3 12.0 - 15.0 g/dL   HCT 36.7 36.0 - 46.0 %   MCV 80.3 78.0 -  100.0 fL   MCH 26.9 26.0 - 34.0 pg   MCHC 33.5 30.0 - 36.0 g/dL   RDW 13.9 11.5 - 15.5 %   Platelets 283 150 - 400 K/uL   Neutrophils Relative % 58 %   Neutro Abs 6.8 1.7 - 7.7 K/uL   Lymphocytes Relative 35 %   Lymphs Abs 4.2 (H) 0.7 - 4.0 K/uL   Monocytes Relative 5 %   Monocytes Absolute 0.6 0.1 - 1.0 K/uL   Eosinophils Relative 2 %   Eosinophils Absolute 0.2 0.0 - 0.7 K/uL   Basophils Relative 0 %   Basophils Absolute 0.0 0.0 - 0.1 K/uL    Comment: Performed at [facility], [place] [address] [place], [place] [address]  Lipid panel     Status: Abnormal   Collection Time: 06/04/17  7:03 AM  Result Value Ref Range   Cholesterol 172 (H) 0 - 169 mg/dL   Triglycerides 144 <150 mg/dL   HDL 69 >40 mg/dL   Total CHOL/HDL Ratio 2.5 RATIO   VLDL 29 0 - 40 mg/dL   LDL Cholesterol 74 0 - 99 mg/dL    Comment:        Total Cholesterol/HDL:CHD Risk Coronary Heart Disease Risk Table                     Men   Women  1/2 Average Risk   3.4   3.3  Average Risk       5.0   4.4  2 X Average Risk   9.6   7.1  3 X Average Risk  23.4   11.0        Use the calculated Patient Ratio above and the CHD Risk Table to determine the patient's CHD Risk.        ATP III CLASSIFICATION (LDL):  <100     mg/dL   Optimal  100-129  mg/dL   Near or Above                    Optimal  130-159  mg/dL   Borderline  160-189  mg/dL   High  >190     mg/dL   Very High Performed at [facility] [address] [place], [place] [address]     Blood Alcohol level:  No results found for: [facility]  Metabolic Disorder Labs: Lab Results  Component Value Date   HGBA1C 5.3 06/03/2017   MPG 105.41 06/03/2017   No results found for: PROLACTIN Lab Results  Component Value  Date   CHOL 172 (H) 06/04/2017   TRIG 144 06/04/2017   HDL 69 06/04/2017   CHOLHDL 2.5 06/04/2017   VLDL 29 06/04/2017   LDLCALC 74 06/04/2017    Physical Findings: AIMS: Facial and Oral Movements Muscles of Facial Expression: None, normal Lips and Perioral Area: None, normal Jaw: None, normal Tongue: None, normal,Extremity Movements Upper (arms, wrists, hands, fingers): None, normal Lower (legs, knees, ankles, toes): None, normal, Trunk Movements Neck, shoulders, hips: None, normal, Overall Severity Severity of abnormal movements (highest score from questions above): None, normal Incapacitation due to abnormal movements: None, normal Patient's awareness of abnormal movements (rate only patient's report): No Awareness, Dental Status Current problems with teeth and/or dentures?: No Does patient usually wear dentures?: No  CIWA:    COWS:     Musculoskeletal: Strength & Muscle Tone: within normal limits Gait & Station: normal Patient leans: N/A  Psychiatric Specialty Exam: Physical Exam  Review of Systems  Constitutional: Negative for malaise/fatigue.  Cardiovascular: Negative for chest pain and palpitations.  Gastrointestinal: Negative for abdominal pain, constipation, diarrhea, heartburn, nausea and vomiting.  Genitourinary: Negative for dysuria, frequency, hematuria and urgency.  Neurological: Negative for dizziness, tingling, tremors, sensory change and headaches.  Psychiatric/Behavioral: Positive for depression and hallucinations. Negative for substance abuse and suicidal ideas. The patient is nervous/anxious. The patient does not have insomnia.        Seems more disorganized today with more thought bloking Patient less engaged on assessment today, denies depression, SI or voices but responding to internal stimuli  All other systems reviewed and are negative.   Blood pressure 100/68, pulse 91, temperature 98 F (36.7 C), temperature source Oral, resp. rate 16, height 5'  0.63" (1.54 m), weight 66.5 kg (146 lb 9.7 oz), SpO2 100 %.Body mass index is 28.04 kg/m.  General Appearance: Disheveled, Remained responding to internal stimuli, less engaged today   Eye Contact:Intermittent   Speech: Disorganized  On content and tangential, low rythm  Volume:  Decreased  Mood:  "alright"  Affect:  Congruent and Flat, anxious and suspicious   Thought Process:  Descriptions of Associations: Tangential thought blocking, seems to be responding to internal stimuli  Orientation:  Other:  person  Thought Content:  Hallucinations: Auditory and Paranoid Ideation  Suicidal Thoughts:  No, unreliable, responded with tangential TP but contracting for safety in the unit  Homicidal Thoughts:  No unreliable, responded with tangential TP  Memory:  poor  Judgement:  Impaired  Insight:  Lacking  Psychomotor Activity:  Decreased  Concentration:  Concentration: Poor  Recall:  Poor  Fund of Knowledge:  Poor  Language:  Fair  Akathisia:  No  Handed:  Right  AIMS (if indicated):     Assets:  Catering manager Housing Physical Health Social Support  ADL's:  Impaired  Cognition:  Impaired,  Mild, seems having LD versus ID as baseline, unclear due to current psychosis  Sleep:       Treatment Plan Summary: - Daily contact with patient to assess and evaluate symptoms and progress in treatment and Medication management -Safety:  Patient contracts for safety on the unit, To continue every 15 minute checks - Labs reviewed:  Prolactin pending and lipid panel With only mild elevation of cholesterol 172. - To reduce current symptoms to base line and improve the patient's overall level of functioning will adjust Medication management as follow: Psychosis, rule out schizophrenia versus MDD with psychosis, not improving as expected, we'll continue to monitor response to Geodon 20 mg twice a day today and we'll titrate tomorrow to 40 mg and monitor if tolerated without any   oversedation, acute dystonia, akathisia or stiffness. MDD, we will monitor respond to restart Lexapro 10 mg daily. Monitor GI symptoms or over activation Insomnia, monitor respond to trazodone 50 mg as needed. Monitor for daytime sedation Patient denies any constipation or GERD today but will continue to monitor since she is not too cooperative with information today.  - Therapy: Patient to continue to participate in group therapy, family therapies, communication skills training, separation and individuation therapies, coping skills training. - Social worker to contact family to further obtain collateral along with setting of family therapy and outpatient treatment at the time of discharge.   Philipp Ovens, MD 06/05/2017, 8:19 AMPatient ID: Valerie [name], female   DOB: 1996/10/03, 21 y.o.   MRN: [id number]

## 2017-06-05 NOTE — Progress Notes (Signed)
Nursing Shift Note:  Nursing Progress Note: 7-7p  D- Mood is depressed and preoccupied laughs inappropriately to self especially when trying to give medications, pt is playing with her tongue affect is blunted and appropriate. Pt is able to contract for safety.Sleep is fair. Goal for today is Pt wrote "I don't want to take medications, I don't like the way it makes me feel" Discuss with pt. About taking I.M medications" I don't like shots and I don't want to take medications when I leave here."  A - Observed pt interacting in group and in the milieu.Support and encouragement offered, safety maintained with q 15 minutes. Group discussion included future planning pt.had much difficulties understanding .Marland Kitchen  R-Contracts for safety and continues to follow treatment plan, working on learning new coping skills for voices  .

## 2017-06-06 LAB — PROLACTIN: PROLACTIN: 79.1 ng/mL — AB (ref 4.8–23.3)

## 2017-06-06 MED ORDER — ZIPRASIDONE HCL 40 MG PO CAPS
40.0000 mg | ORAL_CAPSULE | Freq: Two times a day (BID) | ORAL | Status: DC
Start: 2017-06-06 — End: 2017-06-10
  Administered 2017-06-06 – 2017-06-10 (×8): 40 mg via ORAL
  Filled 2017-06-06 (×14): qty 1

## 2017-06-06 NOTE — Progress Notes (Signed)
Patient ID: Valerie [name], female   DOB: [birth year], 21 y.o.   MRN: [id number]  Patient appears to be responding to internal stimuli. Says she hears voices. Focused on sports. Bizarre behavior. Came out of her room and asked where she should go to go to the bathroom. Seems limited. Affect blunted.  A: Patient given emotional support from RN. Patient given medications per MD orders. Patient encouraged to attend groups and unit activities. Patient encouraged to come to staff with any questions or concerns.  R: Patient remains cooperative and appropriate. Will continue to monitor patient for safety.

## 2017-06-06 NOTE — Progress Notes (Signed)
[facility] MD Progress Note  06/06/2017 4:21 PM Valerie [name]  MRN:  [id number] Subjective:  "better" Patient seen by this MD, case discussed during treatment team and chart reviewed.  As per nursing,Patient appears to be responding to internal stimuli. Says she hears voices. Focused on sports. Bizarre behavior. Came out of her room and asked where she should go to go to the bathroom. Seems limited. Affect blunted.  During evaluation in the unit patient patient seems with less thought blocking and better interaction this morning. As per nursing she is still remains disorganized at time and reporting hearing voices. During evaluation patient reported her mood was okay, she initially was not able to say what was not so good about her day yesterday but verbalized later in the conversation and that she wished that she can play soccer. She endorses as a positive things about her day that she was able to do the hula. She reported she had been having less thoughts and denies any suicidal ideation. She seems less internally preoccupied and less thought blocking. She was able to articulate having some constipation. Reported good visitation with her mother, good sleep and appetite. No stiffness on physical exam. She was educated about increasing the medication this afternoon and 2 told the nurse if any side effects including stiffness or day time sedation. She denies auditory or visual hallucination to this M.D., continues to seems to be responding to internal stimuli but to less in intensity. Verbalize to nursing later in the day that she was hearing voices. Nurse was make aware of need to monitor her ADLs.   Principal Problem: Psychosis Diagnosis:   Patient Active Problem List   Diagnosis Date Noted  . Psychosis [F29] 06/03/2017    Priority: High  . MDD (major depressive disorder), recurrent episode, severe ([place]) [F33.2] 06/02/2017    Priority: High   Total Time spent with patient: 30 minutes and 50% of the  time was use to provide counseling regarding medications and side effects.  Past Psychiatric History:              Outpatient: Seeing Pediatric NP, Dian Situ; Father denies any past outpatient therapy              Inpatient: None              Past medication trial:abilify and lexapro, with good response, dc due to insurance lack and increase on weight with abilify.              Past SA: None                           Psychological testing: None  Medical Problems:             Allergies: Sulfa Antibiotics             Surgeries: None             Head trauma:None             STD: None   Family Psychiatric history: None  Past Medical History:  Past Medical History:  Diagnosis Date  . Anxiety   . Depression   . Fatigue   . Headache   . Psychosis 06/03/2017   History reviewed. No pertinent surgical history. Family History: History reviewed. No pertinent family history.  Social History:  History  Alcohol Use No     History  Drug Use No    Social History  Social History  . Marital status: Single    Spouse name: N/A  . Number of children: N/A  . Years of education: N/A   Social History Main Topics  . Smoking status: Never Smoker  . Smokeless tobacco: Never Used  . Alcohol use No  . Drug use: No  . Sexual activity: No   Other Topics Concern  . None   Social History Narrative  . None   Additional Social History:    Pain Medications: not abusing Prescriptions: not abusing Over the Counter: not abusing History of alcohol / drug use?: No history of alcohol / drug abuse Longest period of sobriety (when/how long): denies Negative Consequences of Use:  (denies) Withdrawal Symptoms:  (denies)        Current Medications: Current Facility-Administered Medications  Medication Dose Route Frequency Provider Last Rate Last Dose  . alum & mag hydroxide-simeth (MAALOX/MYLANTA) 200-200-20 MG/5ML suspension 30 mL  30 mL Oral Q6H PRN Money, Lowry Ram,  FNP      . escitalopram (LEXAPRO) tablet 10 mg  10 mg Oral Daily Valda Lamb, Prentiss Bells, MD   10 mg at 06/06/17 0806  . magnesium hydroxide (MILK OF MAGNESIA) suspension 5 mL  5 mL Oral QHS PRN Money, Lowry Ram, FNP   5 mL at 06/06/17 0710  . norgestimate-ethinyl estradiol (ORTHO-CYCLEN,SPRINTEC,PREVIFEM) 0.25-35 MG-MCG tablet 1 tablet  1 tablet Oral QHS Valda Lamb, Prentiss Bells, MD   1 tablet at 06/05/17 2025  . traZODone (DESYREL) tablet 50 mg  50 mg Oral QHS PRN Valda Lamb, Prentiss Bells, MD   50 mg at 06/05/17 2024  . ziprasidone (GEODON) capsule 40 mg  40 mg Oral BID WC Valda Lamb, Finneas Mathe, MD        Lab Results:  No results found for this or any previous visit (from the past 48 hour(s)).  Blood Alcohol level:  No results found for: [facility]  Metabolic Disorder Labs: Lab Results  Component Value Date   HGBA1C 5.3 06/03/2017   MPG 105.41 06/03/2017   Lab Results  Component Value Date   PROLACTIN 79.1 (H) 06/04/2017   Lab Results  Component Value Date   CHOL 172 (H) 06/04/2017   TRIG 144 06/04/2017   HDL 69 06/04/2017   CHOLHDL 2.5 06/04/2017   VLDL 29 06/04/2017   LDLCALC 74 06/04/2017    Physical Findings: AIMS: Facial and Oral Movements Muscles of Facial Expression: None, normal Lips and Perioral Area: None, normal Jaw: None, normal Tongue: None, normal,Extremity Movements Upper (arms, wrists, hands, fingers): None, normal Lower (legs, knees, ankles, toes): None, normal, Trunk Movements Neck, shoulders, hips: None, normal, Overall Severity Severity of abnormal movements (highest score from questions above): None, normal Incapacitation due to abnormal movements: None, normal Patient's awareness of abnormal movements (rate only patient's report): No Awareness, Dental Status Current problems with teeth and/or dentures?: No Does patient usually wear dentures?: No  CIWA:    COWS:     Musculoskeletal: Strength & Muscle Tone: within normal limits Gait &  Station: normal Patient leans: N/A  Psychiatric Specialty Exam: Physical Exam  Review of Systems  Constitutional: Negative for malaise/fatigue.  Cardiovascular: Negative for chest pain and palpitations.  Gastrointestinal: Negative for abdominal pain, constipation, diarrhea, heartburn, nausea and vomiting.  Genitourinary: Negative for dysuria, frequency, hematuria and urgency.  Neurological: Negative for dizziness, tingling, tremors, sensory change and headaches.  Psychiatric/Behavioral: Positive for depression and hallucinations. Negative for substance abuse and suicidal ideas. The patient is nervous/anxious. The patient does not have insomnia.  Seems more disorganized today with more thought bloking Patient less engaged on assessment today, denies depression, SI or voices but responding to internal stimuli  All other systems reviewed and are negative.   Blood pressure 122/70, pulse 86, temperature 98.3 F (36.8 C), temperature source Oral, resp. rate 16, height 5' 0.63" (1.54 m), weight 66.5 kg (146 lb 9.7 oz), SpO2 100 %.Body mass index is 28.04 kg/m.  General Appearance: Disheveled, Remained responding to internal stimuli, But engagement seems better today with less thought blocking   Eye Contact:Intermittent   Speech: More organized today with normal rate and   Volume:  Decreased  Mood:  "abetter"  Affect:  Congruent and Flat, Is less anxious   Thought Process:  Descriptions of Associations: Tangential less intensity and her thought blocking, seems to be responding to internal stimuli  Orientation:  Other:  person  Thought Content:  Hallucinations: Auditory and Paranoid Ideation  Suicidal Thoughts:  No, unreliable, responded with tangential TP but contracting for safety in the unit  Homicidal Thoughts:  No unreliable, responded with tangential TP  Memory:  poor  Judgement:  Impaired  Insight:  Lacking  Psychomotor Activity:  Decreased  Concentration:  Concentration: Poor   Recall:  Poor  Fund of Knowledge:  Poor  Language:  Fair  Akathisia:  No  Handed:  Right  AIMS (if indicated):     Assets:  Catering manager Housing Physical Health Social Support  ADL's:  Impaired  Cognition:  Impaired,  Mild, seems having LD versus ID as baseline, unclear due to current psychosis  Sleep:       Treatment Plan Summary: - Daily contact with patient to assess and evaluate symptoms and progress in treatment and Medication management -Safety:  Patient contracts for safety on the unit, To continue every 15 minute checks - Labs reviewed:  Prolactin 79.1, no galactorrhea reported. We perform physical exam tomorrow to ensure since patient may not be reliable. - To reduce current symptoms to base line and improve the patient's overall level of functioning will adjust Medication management as follow: Psychosis, rule out schizophrenia disorganized type  versus MDD with psychosis, not improving as expected, we'll increase Geodon to 40 mg twice a day is starting his affect with dinner. We monitor for daytime sedation, acute dystonia, akathisia or over activation.. MDD, verbalizes some improvement, we'll continue Lexapro 10 mg daily  Insomnia, reported improvement monitor respond to trazodone 50 mg as needed. Monitor for daytime sedation Patient denies any constipation or GERD today but will continue to monitor since she is not too cooperative with information today.  - Therapy: Patient to continue to participate in group therapy, family therapies, communication skills training, separation and individuation therapies, coping skills training. - Social worker to contact family to further obtain collateral along with setting of family therapy and outpatient treatment at the time of discharge.   Philipp Ovens, MD 06/06/2017, 4:21 PMPatient ID: Valerie [name], female   DOB: 10-19-1996, 21 y.o.   MRN: [id number]

## 2017-06-06 NOTE — Progress Notes (Signed)
Child/Adolescent Psychoeducational Group Note  Date:  06/06/2017 Time:  3:39 PM  Group Topic/Focus:  Goals Group:   The focus of this group is to help patients establish daily goals to achieve during treatment and discuss how the patient can incorporate goal setting into their daily lives to aide in recovery.  Participation Level:  None  Participation Quality:  Inattentive  Affect:  Anxious and Flat  Cognitive:  Disorganized and Delusional  Insight:  Limited  Engagement in Group:  Lacking  Modes of Intervention:  Activity and Discussion  Additional Comments:  Pt attended goals group this morning. Pt did not share much in group and appears to be confused. Pt is focused on sports and share her goal is to get better with sports. Pt has bizarre behavior.  Calypso Hagarty A 06/06/2017, 3:39 PM

## 2017-06-06 NOTE — [facility] Group Notes (Signed)
[place] LCSW Group Therapy Note  Date/Time:06/06/2017  2:02 PM   Type of Therapy and Topic:  Group Therapy:  Overcoming Obstacles  Participation Level:  Active  Description of Group:    In this group patients will be encouraged to explore what they see as obstacles to their own wellness and recovery. They will be guided to discuss their thoughts, feelings, and behaviors related to these obstacles. The group will process together ways to cope with barriers, with attention given to specific choices patients can make. Each patient will be challenged to identify changes they are motivated to make in order to overcome their obstacles. This group will be process-oriented, with patients participating in exploration of their own experiences as well as giving and receiving support and challenge from other group members.  Therapeutic Goals: 1. Patient will identify personal and current obstacles as they relate to admission. 2. Patient will identify barriers that currently interfere with their wellness or overcoming obstacles.  3. Patient will identify feelings, thought process and behaviors related to these barriers. 4. Patient will identify two changes they are willing to make to overcome these obstacles:    Summary of Patient Progress Group members participated in this activity by defining obstacles and exploring feelings related to obstacles. Group members discussed examples of positive and negative obstacles. Group members identified the obstacle they feel most related to their admission and processed what they could do to overcome and what motivates them to accomplish this goal.     Therapeutic Modalities:   Cognitive Behavioral Therapy Solution Focused Therapy Motivational Interviewing Relapse Prevention Therapy  [place] MSW, LCSW

## 2017-06-06 NOTE — Tx Team (Signed)
Interdisciplinary Treatment and Diagnostic Plan Update  06/06/2017 Time of Session: 9:24 AM  Valerie [name] MRN: [id number]  Principal Diagnosis: Psychosis  Secondary Diagnoses: Principal Problem:   Psychosis Active Problems:   MDD (major depressive disorder), recurrent episode, severe ([place])   Current Medications:  Current Facility-Administered Medications  Medication Dose Route Frequency Provider Last Rate Last Dose  . alum & mag hydroxide-simeth (MAALOX/MYLANTA) 200-200-20 MG/5ML suspension 30 mL  30 mL Oral Q6H PRN Money, Lowry Ram, FNP      . escitalopram (LEXAPRO) tablet 10 mg  10 mg Oral Daily Valda Lamb, Prentiss Bells, MD   10 mg at 06/06/17 0806  . magnesium hydroxide (MILK OF MAGNESIA) suspension 5 mL  5 mL Oral QHS PRN Money, Lowry Ram, FNP   5 mL at 06/06/17 0710  . norgestimate-ethinyl estradiol (ORTHO-CYCLEN,SPRINTEC,PREVIFEM) 0.25-35 MG-MCG tablet 1 tablet  1 tablet Oral QHS Valda Lamb, Prentiss Bells, MD   1 tablet at 06/05/17 2025  . traZODone (DESYREL) tablet 50 mg  50 mg Oral QHS PRN Valda Lamb, Prentiss Bells, MD   50 mg at 06/05/17 2024  . ziprasidone (GEODON) capsule 20 mg  20 mg Oral BID WC Valda Lamb, Prentiss Bells, MD   20 mg at 06/06/17 8563    PTA Medications: Prescriptions Prior to Admission  Medication Sig Dispense Refill Last Dose  . SPRINTEC 28 0.25-35 MG-MCG tablet Take 1 tablet by mouth daily.    06/01/2017 at Unknown time    Treatment Modalities: Medication Management, Group therapy, Case management,  1 to 1 session with clinician, Psychoeducation, Recreational therapy.   Physician Treatment Plan for Primary Diagnosis: Psychosis Long Term Goal(s): Improvement in symptoms so as ready for discharge  Short Term Goals: Ability to identify changes in lifestyle to reduce recurrence of condition will improve, Ability to verbalize feelings will improve, Ability to disclose and discuss suicidal ideas, Ability to demonstrate self-control will improve,  Ability to identify and develop effective coping behaviors will improve and Ability to maintain clinical measurements within normal limits will improve  Medication Management: Evaluate patient's response, side effects, and tolerance of medication regimen.  Therapeutic Interventions: 1 to 1 sessions, Unit Group sessions and Medication administration.  Evaluation of Outcomes: Progressing  Physician Treatment Plan for Secondary Diagnosis: Principal Problem:   Psychosis Active Problems:   MDD (major depressive disorder), recurrent episode, severe ([place])   Long Term Goal(s): Improvement in symptoms so as ready for discharge  Short Term Goals: Ability to identify changes in lifestyle to reduce recurrence of condition will improve, Ability to verbalize feelings will improve, Ability to disclose and discuss suicidal ideas, Ability to demonstrate self-control will improve, Ability to identify and develop effective coping behaviors will improve and Ability to maintain clinical measurements within normal limits will improve  Medication Management: Evaluate patient's response, side effects, and tolerance of medication regimen.  Therapeutic Interventions: 1 to 1 sessions, Unit Group sessions and Medication administration.  Evaluation of Outcomes: Progressing   RN Treatment Plan for Primary Diagnosis: Psychosis Long Term Goal(s): Knowledge of disease and therapeutic regimen to maintain health will improve  Short Term Goals: Ability to remain free from injury will improve and Compliance with prescribed medications will improve  Medication Management: RN will administer medications as ordered by provider, will assess and evaluate patient's response and provide education to patient for prescribed medication. RN will report any adverse and/or side effects to prescribing provider.  Therapeutic Interventions: 1 on 1 counseling sessions, Psychoeducation, Medication administration, Evaluate responses to  treatment, Monitor vital signs and CBGs  as ordered, Perform/monitor CIWA, COWS, AIMS and Fall Risk screenings as ordered, Perform wound care treatments as ordered.  Evaluation of Outcomes: Progressing   LCSW Treatment Plan for Primary Diagnosis: Psychosis Long Term Goal(s): Safe transition to appropriate next level of care at discharge, Engage patient in therapeutic group addressing interpersonal concerns.  Short Term Goals: Engage patient in aftercare planning with referrals and resources, Increase ability to appropriately verbalize feelings, Facilitate acceptance of mental health diagnosis and concerns and Identify triggers associated with mental health/substance abuse issues  Therapeutic Interventions: Assess for all discharge needs, conduct psycho-educational groups, facilitate family session, explore available resources and support systems, collaborate with current community supports, link to needed community supports, educate family/caregivers on suicide prevention, complete Psychosocial Assessment.   Evaluation of Outcomes: Progressing   Progress in Treatment: Attending groups: Yes Participating in groups: Yes Taking medication as prescribed: Yes, MD continues to assess for medication changes as needed Toleration medication: Yes, no side effects reported at this time Family/Significant other contact made:  Patient understands diagnosis:  Discussing patient identified problems/goals with staff: Yes Medical problems stabilized or resolved: Yes Denies suicidal/homicidal ideation:  Issues/concerns per patient self-inventory: None Other: N/A  New problem(s) identified: None identified at this time.   New Short Term/Long Term Goal(s): None identified at this time.   Discharge Plan or Barriers:   8/10: Patient reports "my mom is what bought me into the hospital". Patient reports she does not know what she wants to work on while here in the hospital.   Reason for Continuation of  Hospitalization: Psychosis  Depression Medication stabilization Suicidal ideation   Estimated Length of Stay: 3-5 days: Anticipated discharge date: 8/16  Attendees: Patient:  06/06/2017  9:24 AM  Physician: Hinda Kehr, MD 06/06/2017  9:24 AM  Nursing: Manuela Schwartz RN 06/06/2017  9:24 AM  RN Care Manager: Skipper Cliche, UR RN 06/06/2017  9:24 AM  Social Worker: Dandra Shambaugh L Jazzalynn Rhudy,LCSW 06/06/2017  9:24 AM  Recreational Therapist: Ronald Lobo, LRT 06/06/2017  9:24 AM  Other:  06/06/2017  9:24 AM  Other:  06/06/2017  9:24 AM  Other: 06/06/2017  9:24 AM    Scribe for Treatment Team: Wray Kearns MSW, LCSW  06/06/2017

## 2017-06-06 NOTE — Progress Notes (Signed)
Recreation Therapy Notes  Date: 08.13.2018 Time: 10:30am Location: [address] Dayroom   Group Topic: Values Clarification   Goal Area(s) Addresses:  Patient will successfully identify at least 10 things they are grateful for.  Patient will successfully identify benefit of being grateful.   Behavioral Response:  Appropriate   Intervention: Art  Activity: Grateful Mandala. Patient asked to create mandala, highlighting things they are grateful for. Patient asked to identify at least 1 thing per category, categories include: Knowledge & education; Honesty & Compassion; This moment; Family & friends; Memories; Plants, animals & nature; Food and water; Work, rest, play; Art, music, creativity; Happiness & laughter; Mind, body, spirit  Education: Values Clarification, Discharge Planning.   Education Outcome: Acknowledges education.   Clinical Observations/Feedback: Patient respectfully listened as peers contributed to opening group discussion. Patient worked on [employer], but was only able to complete approximately 25%. Patient spent majority of group session coloring her mandala. Patient stood during processing and did not contribute to discussion.    Laureen Ochs Tahmir Kleckner, LRT/CTRS         Odaliz Mcqueary L 06/06/2017 2:11 PM

## 2017-06-06 NOTE — Progress Notes (Signed)
Patient ID: Valerie [name], female   DOB: Nov 27, 1995, 21 y.o.   MRN: [id number] Appears flat, brightens with interaction with this Probation officer. Reports that she had a bowel movement today and stated she went outside. Reports good appetite. Back and forth from room to dayroom often. Unable to participate in group, reports her goal was to "get better in sports." denies si/hi/pain. Denies AV hallucinations. ADL's intact, pt able to keep room clean, shower and provide care to self. Will continue to closely monitor.

## 2017-06-06 NOTE — Progress Notes (Signed)
Recreation Therapy Notes  Date: 08.13.2018 Time: 2:30pm - 3:15pm Location: [address] Dayroom       Group Topic/Focus: Music with [place] and Recreation  Goal Area(s) Addresses:  Patient will actively engage in music group with peers and staff.   Behavioral Response: Appropriate   Intervention: Music   Clinical Observations/Feedback: Patient with peers and staff participated in music group, engaging in drum circle lead by staff from [place], part of [facility] and [facility]. Patient engaged to the best of her ability, but was focused on her palms being sweaty.   Laureen Ochs Jordon Bourquin, LRT/CTRS         Lane Hacker 06/06/2017 3:58 PM

## 2017-06-07 MED ORDER — TRAZODONE HCL 50 MG PO TABS
75.0000 mg | ORAL_TABLET | Freq: Every evening | ORAL | Status: DC | PRN
  Administered 2017-06-07: 75 mg via ORAL
  Filled 2017-06-07: qty 2

## 2017-06-07 NOTE — [facility] Group Notes (Signed)
[place] LCSW Group Therapy Note   Date/Time: 06/07/2017 4:13 PM   Type of Therapy and Topic: Group Therapy: Communication   Participation Level: active   Description of Group:  In this group patients will be encouraged to explore how individuals communicate with one another appropriately and inappropriately. Patients will be guided to discuss their thoughts, feelings, and behaviors related to barriers communicating feelings, needs, and stressors. The group will process together ways to execute positive and appropriate communications, with attention given to how one use behavior, tone, and body language to communicate. Each patient will be encouraged to identify specific changes they are motivated to make in order to overcome communication barriers with self, peers, authority, and parents. This group will be process-oriented, with patients participating in exploration of their own experiences as well as giving and receiving support and challenging self as well as other group members.   Therapeutic Goals:  1. Patient will identify how people communicate (body language, facial expression, and electronics) Also discuss tone, voice and how these impact what is communicated and how the message is perceived.  2. Patient will identify feelings (such as fear or worry), thought process and behaviors related to why people internalize feelings rather than express self openly.  3. Patient will identify two changes they are willing to make to overcome communication barriers.  4. Members will then practice through Role Play how to communicate by utilizing psycho-education material (such as I Feel statements and acknowledging feelings rather than displacing on others)    Summary of Patient Progress  Group members engaged in discussion about communication. Group members completed "I statement" worksheet and "Care Tags" to discuss increase self awareness of healthy and effective ways to communicate. Group members  shared their Care tags discussing emotions, improving positive and clear communication as well as the ability to appropriately express needs.     Therapeutic Modalities:  Cognitive Behavioral Therapy  Solution Focused Therapy  Motivational [place] MSW, LCSW

## 2017-06-07 NOTE — Progress Notes (Signed)
Patient ID: Valerie [name], female   DOB: Oct 20, 1996, 21 y.o.   MRN: [id number]  Patient very disorganized in her thinking. Brightens on approach but blunted most of the time. Has trouble putting together simple sentences. Very somatic. Complained of cramps, inability to urinate. Paced the hall frequently. Given support. Meds given as needed. Patient safe on the unit.

## 2017-06-07 NOTE — Progress Notes (Signed)
[facility] MD Progress Note  06/07/2017 12:49 PM Valerie [name]  MRN:  [id number] Subjective:  "alright" Patient seen by this MD, case discussed during treatment team and chart reviewed.  As per nursing,Has been restless tonight, up and down often throughout the night. Pt woke up at 0430 am and has been unable to go back to sleep, pt has been laying in bed, no distress, no complaints.In to do 15 min check, sitting on bed. Asked what time it was, was surprised it wasn't breakfast time yet. Asked if hungry or thirty. Asked for some water. Denies pain. 240 cc water consumed. Got AM vital signs. Stated " I think I may be going home on Thursday, I miss my dog, Mia." was talking about dog, stating she was a german shepard and is very smart.  Pleasant and smiling . Encouraged to go back to sleep. Receptive. Appears flat, brightens with interaction with this Probation officer. Reports that she had a bowel movement today and stated she went outside. Reports good appetite. Back and forth from room to dayroom often. Unable to participate in group, reports her goal was to "get better in sports." denies si/hi/pain. Denies AV hallucinations. ADL's intact, pt able to keep room clean, shower and provide care to self.   During evaluation in the unit patient seems brighter with better eye contact but still significant disorganization in her thought processes. She seems to have less thought blocking when talking in Spanish with this M.D. She initially engage well in the conversation, reported she woke up in the middle of the night and she eats some cheese stick. She reported she had an "alright" day yesterday, that she went outside and play sports but  was hot  and she got tired. She endorses "less voices" and then. Good visitation with her parents. Improving on her constipation, eating okay. Sudentlythe patient will start laughing and reported" I should not be using red""because the bull will get me". She also kept looking to the side toward  the PA student and reported that the PA student looks like the bull. Patient remained with very tangential thought processes at time. She is contracting for safety in the unit, is not very forthcoming with information regarding to the voices and she was having some thoughts. Patient was educated about increase in the medication geodon and trazodone and to monitor for side effects. No stiffness on physical exam after increasing Geodon. Nursing aware to monitor for any akathisia, over activation or acute dystonia.   Principal Problem: Psychosis Diagnosis:   Patient Active Problem List   Diagnosis Date Noted  . Psychosis [F29] 06/03/2017    Priority: High  . MDD (major depressive disorder), recurrent episode, severe ([place]) [F33.2] 06/02/2017    Priority: High   Total Time spent with patient: 30 minutes and 50% of the time was use to provide counseling regarding medications and side effects.  Past Psychiatric History:              Outpatient: Seeing Pediatric NP, Dian Situ; Father denies any past outpatient therapy              Inpatient: None              Past medication trial:abilify and lexapro, with good response, dc due to insurance lack and increase on weight with abilify.              Past SA: None  Psychological testing: None  Medical Problems:             Allergies: Sulfa Antibiotics             Surgeries: None             Head trauma:None             STD: None   Family Psychiatric history: None  Past Medical History:  Past Medical History:  Diagnosis Date  . Anxiety   . Depression   . Fatigue   . Headache   . Psychosis 06/03/2017   History reviewed. No pertinent surgical history. Family History: History reviewed. No pertinent family history.  Social History:  History  Alcohol Use No     History  Drug Use No    Social History   Social History  . Marital status: Single    Spouse name: N/A  . Number of children: N/A   . Years of education: N/A   Social History Main Topics  . Smoking status: Never Smoker  . Smokeless tobacco: Never Used  . Alcohol use No  . Drug use: No  . Sexual activity: No   Other Topics Concern  . None   Social History Narrative  . None   Additional Social History:    Pain Medications: not abusing Prescriptions: not abusing Over the Counter: not abusing History of alcohol / drug use?: No history of alcohol / drug abuse Longest period of sobriety (when/how long): denies Negative Consequences of Use:  (denies) Withdrawal Symptoms:  (denies)        Current Medications: Current Facility-Administered Medications  Medication Dose Route Frequency Provider Last Rate Last Dose  . alum & mag hydroxide-simeth (MAALOX/MYLANTA) 200-200-20 MG/5ML suspension 30 mL  30 mL Oral Q6H PRN Money, Lowry Ram, FNP      . escitalopram (LEXAPRO) tablet 10 mg  10 mg Oral Daily Valda Lamb, Prentiss Bells, MD   10 mg at 06/07/17 3382  . magnesium hydroxide (MILK OF MAGNESIA) suspension 5 mL  5 mL Oral QHS PRN Money, Lowry Ram, FNP   5 mL at 06/06/17 0710  . norgestimate-ethinyl estradiol (ORTHO-CYCLEN,SPRINTEC,PREVIFEM) 0.25-35 MG-MCG tablet 1 tablet  1 tablet Oral QHS Valda Lamb, Prentiss Bells, MD   1 tablet at 06/06/17 2011  . traZODone (DESYREL) tablet 75 mg  75 mg Oral QHS PRN Valda Lamb, Prentiss Bells, MD      . ziprasidone (GEODON) capsule 40 mg  40 mg Oral BID WC Valda Lamb, Prentiss Bells, MD   40 mg at 06/07/17 0813    Lab Results:  No results found for this or any previous visit (from the past 18 hour(s)).  Blood Alcohol level:  No results found for: [facility]  Metabolic Disorder Labs: Lab Results  Component Value Date   HGBA1C 5.3 06/03/2017   MPG 105.41 06/03/2017   Lab Results  Component Value Date   PROLACTIN 79.1 (H) 06/04/2017   Lab Results  Component Value Date   CHOL 172 (H) 06/04/2017   TRIG 144 06/04/2017   HDL 69 06/04/2017   CHOLHDL 2.5 06/04/2017   VLDL 29  06/04/2017   LDLCALC 74 06/04/2017    Physical Findings: AIMS: Facial and Oral Movements Muscles of Facial Expression: None, normal Lips and Perioral Area: None, normal Jaw: None, normal Tongue: None, normal,Extremity Movements Upper (arms, wrists, hands, fingers): None, normal Lower (legs, knees, ankles, toes): None, normal, Trunk Movements Neck, shoulders, hips: None, normal, Overall Severity Severity of abnormal movements (highest score from questions above):  None, normal Incapacitation due to abnormal movements: None, normal Patient's awareness of abnormal movements (rate only patient's report): No Awareness, Dental Status Current problems with teeth and/or dentures?: No Does patient usually wear dentures?: No  CIWA:    COWS:     Musculoskeletal: Strength & Muscle Tone: within normal limits Gait & Station: normal Patient leans: N/A  Psychiatric Specialty Exam: Physical Exam  Review of Systems  Constitutional: Negative for malaise/fatigue.  Cardiovascular: Negative for chest pain and palpitations.  Gastrointestinal: Negative for abdominal pain, constipation, diarrhea, heartburn, nausea and vomiting.  Genitourinary: Negative for dysuria, frequency, hematuria and urgency.  Musculoskeletal: Negative for back pain, myalgias and neck pain.  Neurological: Negative for dizziness, tingling, tremors, sensory change and headaches.  Psychiatric/Behavioral: Positive for depression and hallucinations. Negative for substance abuse and suicidal ideas. The patient is nervous/anxious and has insomnia.        "less voices, just thinking"  All other systems reviewed and are negative.   Blood pressure 118/62, pulse 82, temperature 98.3 F (36.8 C), temperature source Oral, resp. rate 16, height 5' 0.63" (1.54 m), weight 66.5 kg (146 lb 9.7 oz), SpO2 100 %.Body mass index is 28.04 kg/m.  General Appearance: Disheveled,Remained suspicious, inappropriate laughing, responding to internal stimuli    Eye Contact:Intermittent   Speech: More organized today with normal rate and   Volume:  Decreased  Mood:  "alright"  Affect:  Congruent and Flat, Is less anxious   Thought Process:  Descriptions of Associations: Tangential less intensity and her thought blocking, seems to be responding to internal stimuli, see hpi  Orientation:  Other:  person  Thought Content:  Hallucinations: Auditory and Paranoid Ideation  Suicidal Thoughts:  No,Contracting for safety in the unit   Homicidal Thoughts:  No  Memory:  poor  Judgement:  Impaired  Insight:  Lacking  Psychomotor Activity:  Decreased  Concentration:  Concentration: Poor  Recall:  Poor  Fund of Knowledge:  Poor  Language:  Fair  Akathisia:  No  Handed:  Right  AIMS (if indicated):     Assets:  Catering manager Housing Physical Health Social Support  ADL's:  Impaired  Cognition:  Impaired,  Mild, seems having LD versus ID as baseline, unclear due to current psychosis  Sleep:       Treatment Plan Summary: - Daily contact with patient to assess and evaluate symptoms and progress in treatment and Medication management -Safety:  Patient contracts for safety on the unit, To continue every 15 minute checks - Labs reviewed:  Prolactin 79.1, no galactorrhea reported/observed. - To reduce current symptoms to base line and improve the patient's overall level of functioning will adjust Medication management as follow: Psychosis, rule out schizophrenia disorganized type  versus MDD with psychosis, not improving as expected, patient continued with disorganized thought process. Will monitor response to  Geodon to 40 mg twice a day. We monitor for daytime sedation, acute dystonia, akathisia or over activation.. MDD, verbalizes some improvement, we'll continue Lexapro 10 mg daily  Insomnia, no improving, will increase trazodone 25 mg as needed. Monitor for daytime sedation Constipation, improving  - Therapy: Patient to continue to  participate in group therapy, family therapies, communication skills training, separation and individuation therapies, coping skills training. - Social worker to contact family to further obtain collateral along with setting of family therapy and outpatient treatment at the time of discharge.   Philipp Ovens, MD 06/07/2017, 12:49 PMPatient ID: Valerie [name], female   DOB: 1996-08-18, 21 y.o.   MRN:  [id number] Patient ID: Valerie [name], female   DOB: 1995/12/30, 21 y.o.   MRN: [id number]

## 2017-06-07 NOTE — BHH Counselor (Signed)
CSW attempted to contact mother to schedule family session and discuss discharge. CSW left message requesting call back.   [place] MSW, LCSW  06/07/2017 11:25 AM

## 2017-06-07 NOTE — Progress Notes (Signed)
Recreation Therapy Notes  Animal-Assisted Therapy (AAT) Program Checklist/Progress Notes Patient Eligibility Criteria Checklist & Daily Group note for Rec Tx Intervention  Date: 08.14.2018 Time: 10:55am Location: [address]   AAA/T Program Assumption of Risk Form signed by Patient/ or Parent Legal Guardian Yes  Patient is free of allergies or sever asthma  Yes  Patient reports no fear of animals NO  Patient reports no history of cruelty to animals NO   Patient understands his/her participation is voluntary Yes  Patient washes hands before animal contact Yes  Patient washes hands after animal contact Yes  Goal Area(s) Addresses:  Patient will demonstrate appropriate social skills during group session.  Patient will demonstrate ability to follow instructions during group session.  Patient will identify reduction in anxiety level due to participation in animal assisted therapy session.    Behavioral Response: Did not attend.     Valerie [name] Valerie [name], LRT/CTRS          Valerie [name] 06/07/2017 1:17 PM

## 2017-06-07 NOTE — Progress Notes (Signed)
Recreation Therapy Notes  INPATIENT RECREATION THERAPY ASSESSMENT  Patient Details Name: Valerie [name] MRN: [id number] DOB: 01-09-1996 Today's Date: 06/07/2017   Patient continues to present with thought blocking and disorganized thought patterns.   Patient Stressors: Patient unable to identify stressors, sharing that she was admitted after an argument with her mother because she let the dog out "because I want him to learn to defend herself." Patient then stated she is having difficulty using the bathroom. Patient related her difficulty using the bathroom to the amount of water she drinks, which she stated is more than she used to drink.    Coping Skills:   Isolate, Self-Injury, Talking, Read, Movie  Personal Challenges: Patient denies   Leisure Interests (2+):  Sports - Racing, Sports - Statistician Resources:  No  Patient Strengths:  "ummmmm....cake"   Patient Identified Areas of Improvement:  unable to answer  Current Recreation Participation:  often  Patient Goal for Hospitalization:  unable to identify  [place] of Residence:  [place] of Residence:  [place]   Current SI (including self-harm):  No  Current HI:  No  Consent to Intern Participation: N/A  Lane Hacker, LRT/CTRS  Ronald Lobo L 06/07/2017, 1:17 PM

## 2017-06-07 NOTE — Progress Notes (Signed)
Patient ID: Valerie [name], female   DOB: 26-Apr-1996, 21 y.o.   MRN: [id number] In to do 15 min check, sitting on bed. Asked what time it was, was surprised it wasn't breakfast time yet. Asked if hungry or thirty. Asked for some water. Denies pain. 240 cc water consumed. Got AM vital signs. Stated " I think I may be going home on Thursday, I miss my dog, Valerie [name]." was talking about dog, stating she was a german shepard and is very smart.  Pleasant and smiling . Encouraged to go back to sleep. Receptive.

## 2017-06-07 NOTE — Progress Notes (Signed)
Child/Adolescent Psychoeducational Group Note  Date:  06/07/2017 Time:  1:04 PM  Group Topic/Focus:  Goals Group:   The focus of this group is to help patients establish daily goals to achieve during treatment and discuss how the patient can incorporate goal setting into their daily lives to aide in recovery.  Participation Level:  Active  Participation Quality:  Inattentive  Affect:  Depressed  Cognitive:  Confused  Insight:  Limited  Engagement in Group:  Limited  Modes of Intervention:  Discussion  Additional Comments:  Pt  Goal for today was to list 10 things she like about herself. She rated her day an 7 out of 10.  [place] 06/07/2017, 1:04 PM

## 2017-06-07 NOTE — Progress Notes (Signed)
Patient ID: Valerie [name], female   DOB: 12/29/1995, 21 y.o.   MRN: [id number] Has been restless tonight, up and down often throughout the night. Pt woke up at 0430 am and has been unable to go back to sleep, pt has been laying in bed, no distress, no complaints.

## 2017-06-08 MED ORDER — TRAZODONE HCL 100 MG PO TABS
100.0000 mg | ORAL_TABLET | Freq: Every evening | ORAL | Status: DC | PRN
  Administered 2017-06-08 – 2017-06-09 (×2): 100 mg via ORAL
  Filled 2017-06-08 (×2): qty 1

## 2017-06-08 MED ORDER — ESCITALOPRAM OXALATE 20 MG PO TABS
20.0000 mg | ORAL_TABLET | Freq: Every day | ORAL | Status: DC
  Administered 2017-06-09 – 2017-06-10 (×2): 20 mg via ORAL
  Filled 2017-06-08 (×5): qty 1

## 2017-06-08 MED ORDER — IBUPROFEN 600 MG PO TABS
600.0000 mg | ORAL_TABLET | Freq: Four times a day (QID) | ORAL | Status: DC | PRN
  Administered 2017-06-08: 600 mg via ORAL
  Filled 2017-06-08: qty 1

## 2017-06-08 NOTE — BHH Counselor (Signed)
CSW attempted to contact mother. CSW left another message requesting call back.   Max MSW, LCSW 06/08/2017 2:01 PM

## 2017-06-08 NOTE — Progress Notes (Signed)
Patient ID: Valerie [name], female   DOB: 1996/03/12, 21 y.o.   MRN: [id number] D) Pt affect incongruent. Mood labile. Pt speech is slow and thought blocking appears present. Pt thoughts also tangential and disorganized. Pt frequently requires support and reassurance and redirection to stay on task. Valerie [name] has had numerous somatic c/o. When Probation officer assessing pt for avh pt smiles and made eye contact with Probation officer and stated "I have a headache". Pt later c/o headache again and was provided medication which she spit out. Pt appetite is poor. Pt pacing at times. A) Level 3 obs for safety, support and reassurance provided. Redirection as needed. 1:1 support from staff. R) safety maintained.

## 2017-06-08 NOTE — Progress Notes (Signed)
Recreation Therapy Notes  Date: 08.15.2018 Time: 10:30am Location: [address] Day[address]   Group Topic: Self-Esteem  Goal Area(s) Addresses:  Patient will successfully identify positive attributes about themselves.  Patient will successfully identify benefit of improved self-esteem.   Behavioral Response: Appropriate   Intervention: Art  Activity: Patient provided a worksheet with a large letter "I" and colored pencils, using worksheet patient was asked to identify at least 20 positive attributes about themselves.   Education:  Self-Esteem, Dentist.   Education Outcome: Acknowledges education  Clinical Observations/Feedback: Patient attended group and participated to the best of her capability. Patient was observed to color a soccer ball on her page and write "Run." Patient able to identify very little additional information for her worksheet. Patient made no statements or contributions during group session.   Laureen Ochs Bellarose Burtt, LRT/CTRS        Hasani Diemer L 06/08/2017 3:10 PM

## 2017-06-08 NOTE — Tx Team (Signed)
Interdisciplinary Treatment and Diagnostic Plan Update  06/08/2017 Time of Session: 9:25 AM  Valerie [name] MRN: [id number]  Principal Diagnosis: Psychosis  Secondary Diagnoses: Principal Problem:   Psychosis Active Problems:   MDD (major depressive disorder), recurrent episode, severe ([place])   Current Medications:  Current Facility-Administered Medications  Medication Dose Route Frequency Provider Last Rate Last Dose  . alum & mag hydroxide-simeth (MAALOX/MYLANTA) 200-200-20 MG/5ML suspension 30 mL  30 mL Oral Q6H PRN Money, Lowry Ram, FNP      . escitalopram (LEXAPRO) tablet 10 mg  10 mg Oral Daily Valda Lamb, Prentiss Bells, MD   10 mg at 06/08/17 0813  . magnesium hydroxide (MILK OF MAGNESIA) suspension 5 mL  5 mL Oral QHS PRN Money, Lowry Ram, FNP   5 mL at 06/06/17 0710  . norgestimate-ethinyl estradiol (ORTHO-CYCLEN,SPRINTEC,PREVIFEM) 0.25-35 MG-MCG tablet 1 tablet  1 tablet Oral QHS Valda Lamb, Prentiss Bells, MD   1 tablet at 06/07/17 2053  . traZODone (DESYREL) tablet 75 mg  75 mg Oral QHS PRN Valda Lamb, Prentiss Bells, MD   75 mg at 06/07/17 2053  . ziprasidone (GEODON) capsule 40 mg  40 mg Oral BID WC Valda Lamb, Prentiss Bells, MD   40 mg at 06/08/17 4742    PTA Medications: Prescriptions Prior to Admission  Medication Sig Dispense Refill Last Dose  . SPRINTEC 28 0.25-35 MG-MCG tablet Take 1 tablet by mouth daily.    06/01/2017 at Unknown time    Treatment Modalities: Medication Management, Group therapy, Case management,  1 to 1 session with clinician, Psychoeducation, Recreational therapy.   Physician Treatment Plan for Primary Diagnosis: Psychosis Long Term Goal(s): Improvement in symptoms so as ready for discharge  Short Term Goals: Ability to identify changes in lifestyle to reduce recurrence of condition will improve, Ability to verbalize feelings will improve, Ability to disclose and discuss suicidal ideas, Ability to demonstrate self-control will improve,  Ability to identify and develop effective coping behaviors will improve and Ability to maintain clinical measurements within normal limits will improve  Medication Management: Evaluate patient's response, side effects, and tolerance of medication regimen.  Therapeutic Interventions: 1 to 1 sessions, Unit Group sessions and Medication administration.  Evaluation of Outcomes: Progressing  Physician Treatment Plan for Secondary Diagnosis: Principal Problem:   Psychosis Active Problems:   MDD (major depressive disorder), recurrent episode, severe ([place])   Long Term Goal(s): Improvement in symptoms so as ready for discharge  Short Term Goals: Ability to identify changes in lifestyle to reduce recurrence of condition will improve, Ability to verbalize feelings will improve, Ability to disclose and discuss suicidal ideas, Ability to demonstrate self-control will improve, Ability to identify and develop effective coping behaviors will improve and Ability to maintain clinical measurements within normal limits will improve  Medication Management: Evaluate patient's response, side effects, and tolerance of medication regimen.  Therapeutic Interventions: 1 to 1 sessions, Unit Group sessions and Medication administration.  Evaluation of Outcomes: Progressing   RN Treatment Plan for Primary Diagnosis: Psychosis Long Term Goal(s): Knowledge of disease and therapeutic regimen to maintain health will improve  Short Term Goals: Ability to remain free from injury will improve and Compliance with prescribed medications will improve  Medication Management: RN will administer medications as ordered by provider, will assess and evaluate patient's response and provide education to patient for prescribed medication. RN will report any adverse and/or side effects to prescribing provider.  Therapeutic Interventions: 1 on 1 counseling sessions, Psychoeducation, Medication administration, Evaluate responses to  treatment, Monitor vital signs and CBGs  as ordered, Perform/monitor CIWA, COWS, AIMS and Fall Risk screenings as ordered, Perform wound care treatments as ordered.  Evaluation of Outcomes: Progressing   LCSW Treatment Plan for Primary Diagnosis: Psychosis Long Term Goal(s): Safe transition to appropriate next level of care at discharge, Engage patient in therapeutic group addressing interpersonal concerns.  Short Term Goals: Engage patient in aftercare planning with referrals and resources, Increase ability to appropriately verbalize feelings, Facilitate acceptance of mental health diagnosis and concerns and Identify triggers associated with mental health/substance abuse issues  Therapeutic Interventions: Assess for all discharge needs, conduct psycho-educational groups, facilitate family session, explore available resources and support systems, collaborate with current community supports, link to needed community supports, educate family/caregivers on suicide prevention, complete Psychosocial Assessment.   Evaluation of Outcomes: Progressing   Progress in Treatment: Attending groups: Yes Participating in groups: Yes Taking medication as prescribed: Yes, MD continues to assess for medication changes as needed Toleration medication: Yes, no side effects reported at this time Family/Significant other contact made:  Patient understands diagnosis:  Discussing patient identified problems/goals with staff: Yes Medical problems stabilized or resolved: Yes Denies suicidal/homicidal ideation:  Issues/concerns per patient self-inventory: None Other: N/A  New problem(s) identified: None identified at this time.   New Short Term/Long Term Goal(s): None identified at this time.   Discharge Plan or Barriers:   8/10: Patient reports "my mom is what bought me into the hospital". Patient reports she does not know what she wants to work on while here in the hospital.   Reason for Continuation of  Hospitalization: Psychosis  Depression Medication stabilization Suicidal ideation   Estimated Length of Stay: 8/17  Attendees: Patient:  06/08/2017  9:25 AM  Physician: Hinda Kehr, MD 06/08/2017  9:25 AM  Nursing: Sharyn Lull, RN 06/08/2017  9:25 AM  RN Care Manager: Skipper Cliche, UR RN 06/08/2017  9:25 AM  Social Worker: Pura Picinich L Zilla Shartzer,LCSW 06/08/2017  9:25 AM  Recreational Therapist: Ronald Lobo, LRT 06/08/2017  9:25 AM  Other:  06/08/2017  9:25 AM  Other:  06/08/2017  9:25 AM  Other: 06/08/2017  9:25 AM    Scribe for Treatment Team: Wray Kearns MSW, LCSW  06/08/2017

## 2017-06-08 NOTE — Progress Notes (Signed)
[facility] MD Progress Note  06/08/2017 11:40 AM Valerie [name]  MRN:  [id number] Subjective:  "I'm ok" Patient seen by this MD, case discussed during treatment team and chart reviewed.  On evaluation in the unit, pt is a&ox4, pleasant and interactive with the interviewer, but continues to demonstrate some disorganized thought processes. She states that her appetite is good. Pt slept well last night, but did wake several times with menstrual cramps. When asked about group participation, pt became tearful and said that she feels like she should keep her feelings to herself because her mother encourages her to "woman up". Pt had a visit from her mother yesterday that went well. She states that she misses her family and is looking forward to being back home with them. Pt's thought processes throughout the interview today seemed to be more concrete than before, and remain somewhat tangential. At one point, interviewer asked pt whether she had participated in pet therapy yesterday, and she responded with an unrelated story about hurting her foot while running. Pt denies SI, HI, and self-injurious thoughts, states that auditory hallucinations are "better". Pt contracts for safety on unit.    Later on in the day when  spoke with patient she reported having visual hallucination, seeing the swings  on the patio moving. She questioned if possible  That it was windy,  butt this M.D. was seen this swings standing without movement. She has significant disorganized thought processes, answered the questions regarding suicide or voices with responses regarding her menstrual cramping and constipation. Weif she is feeling better and that at time of admission she reported that she is feeling better because she feels she is less distracted. Ordered a good visitation with her family and missing home. Principal Problem: Psychosis Diagnosis:   Patient Active Problem List   Diagnosis Date Noted  . Psychosis [F29] 06/03/2017     Priority: High  . MDD (major depressive disorder), recurrent episode, severe ([place]) [F33.2] 06/02/2017    Priority: High   Total Time spent with patient: 25 minutes and 50% of the time was use to provide counseling regarding medications and side effects.  Past Psychiatric History:              Outpatient: Seeing Pediatric NP, Dian Situ; Father denies any past outpatient therapy              Inpatient: None              Past medication trial:abilify and lexapro, with good response, dc due to insurance lack and increase on weight with abilify.              Past SA: None                           Psychological testing: None  Medical Problems:             Allergies: Sulfa Antibiotics             Surgeries: None             Head trauma:None             STD: None   Family Psychiatric history: None  Past Medical History:  Past Medical History:  Diagnosis Date  . Anxiety   . Depression   . Fatigue   . Headache   . Psychosis 06/03/2017   History reviewed. No pertinent surgical history. Family History: History reviewed. No pertinent family history.  Social  History:  History  Alcohol Use No     History  Drug Use No    Social History   Social History  . Marital status: Single    Spouse name: N/A  . Number of children: N/A  . Years of education: N/A   Social History Main Topics  . Smoking status: Never Smoker  . Smokeless tobacco: Never Used  . Alcohol use No  . Drug use: No  . Sexual activity: No   Other Topics Concern  . None   Social History Narrative  . None   Additional Social History:    Pain Medications: not abusing Prescriptions: not abusing Over the Counter: not abusing History of alcohol / drug use?: No history of alcohol / drug abuse Longest period of sobriety (when/how long): denies Negative Consequences of Use:  (denies) Withdrawal Symptoms:  (denies)        Current Medications: Current Facility-Administered Medications   Medication Dose Route Frequency Provider Last Rate Last Dose  . alum & mag hydroxide-simeth (MAALOX/MYLANTA) 200-200-20 MG/5ML suspension 30 mL  30 mL Oral Q6H PRN Money, Lowry Ram, FNP      . escitalopram (LEXAPRO) tablet 10 mg  10 mg Oral Daily Valda Lamb, Prentiss Bells, MD   10 mg at 06/08/17 0813  . magnesium hydroxide (MILK OF MAGNESIA) suspension 5 mL  5 mL Oral QHS PRN Money, Lowry Ram, FNP   5 mL at 06/06/17 0710  . norgestimate-ethinyl estradiol (ORTHO-CYCLEN,SPRINTEC,PREVIFEM) 0.25-35 MG-MCG tablet 1 tablet  1 tablet Oral QHS Valda Lamb, Prentiss Bells, MD   1 tablet at 06/07/17 2053  . traZODone (DESYREL) tablet 75 mg  75 mg Oral QHS PRN Valda Lamb, Prentiss Bells, MD   75 mg at 06/07/17 2053  . ziprasidone (GEODON) capsule 40 mg  40 mg Oral BID WC Valda Lamb, Prentiss Bells, MD   40 mg at 06/08/17 4765    Lab Results:  No results found for this or any previous visit (from the past 48 hour(s)).  Blood Alcohol level:  No results found for: [facility]  Metabolic Disorder Labs: Lab Results  Component Value Date   HGBA1C 5.3 06/03/2017   MPG 105.41 06/03/2017   Lab Results  Component Value Date   PROLACTIN 79.1 (H) 06/04/2017   Lab Results  Component Value Date   CHOL 172 (H) 06/04/2017   TRIG 144 06/04/2017   HDL 69 06/04/2017   CHOLHDL 2.5 06/04/2017   VLDL 29 06/04/2017   LDLCALC 74 06/04/2017    Physical Findings: AIMS: Facial and Oral Movements Muscles of Facial Expression: None, normal Lips and Perioral Area: None, normal Jaw: None, normal Tongue: None, normal,Extremity Movements Upper (arms, wrists, hands, fingers): None, normal Lower (legs, knees, ankles, toes): None, normal, Trunk Movements Neck, shoulders, hips: None, normal, Overall Severity Severity of abnormal movements (highest score from questions above): None, normal Incapacitation due to abnormal movements: None, normal Patient's awareness of abnormal movements (rate only patient's report): No  Awareness, Dental Status Current problems with teeth and/or dentures?: No Does patient usually wear dentures?: No  CIWA:    COWS:     Musculoskeletal: Strength & Muscle Tone: within normal limits Gait & Station: normal Patient leans: N/A  Psychiatric Specialty Exam: Physical Exam  Psychiatric: Her affect is blunt. Her speech is delayed and tangential.  Thought-blocking is improved, but occasionally persists.     Review of Systems  Constitutional: Negative for malaise/fatigue.  Cardiovascular: Negative for chest pain and palpitations.  Gastrointestinal: Negative for abdominal pain, constipation, diarrhea, heartburn, nausea  and vomiting.  Genitourinary: Negative for dysuria, frequency, hematuria and urgency.  Musculoskeletal: Negative for back pain, myalgias and neck pain.  Neurological: Negative for dizziness, tingling, tremors, sensory change and headaches.  Psychiatric/Behavioral: Positive for depression and hallucinations (visual). Negative for substance abuse and suicidal ideas. The patient is nervous/anxious and has insomnia.        "less distracted"  All other systems reviewed and are negative.   Blood pressure 105/72, pulse 85, temperature (!) 97.5 F (36.4 C), temperature source Oral, resp. rate 16, height 5' 0.63" (1.54 m), weight 66.5 kg (146 lb 9.7 oz), SpO2 100 %.Body mass index is 28.04 kg/m.  General Appearance: Disheveled,Remained Actively responding to internal stimuli.   Eye Contact:Intermittent   Speech: More organized today with normal rate and   Volume:  Decreased  Mood:  "alright"  Affect:  Congruent and Flat,  less anxious   Thought Process:  Descriptions of Associations: Tangential less intensity and her thought blocking, seems to be responding to internal stimuli, see hpi  Orientation:  Other:  person  Thought Content:  Hallucinations: Auditory and Paranoid Ideation  Suicidal Thoughts:  No,Contracting for safety in the unit   Homicidal Thoughts:  No   Memory:  poor  Judgement:  Impaired  Insight:  Lacking  Psychomotor Activity:  Decreased  Concentration:  Concentration: Poor  Recall:  Poor  Fund of Knowledge:  Poor  Language:  Fair  Akathisia:  No  Handed:  Right  AIMS (if indicated):     Assets:  Catering manager Housing Physical Health Social Support  ADL's:  Impaired  Cognition:  Impaired,  Mild, seems having LD versus ID as baseline, unclear due to current psychosis  Sleep:       Treatment Plan Summary: - Daily contact with patient to assess and evaluate symptoms and progress in treatment and Medication management -Safety:  Patient contracts for safety on the unit, To continue every 15 minute checks - Labs reviewed:  Prolactin 79.1, no galactorrhea reported/observed. CT head negative - To reduce current symptoms to base line and improve the patient's overall level of functioning will adjust Medication management as follow: Psychosis, rule out schizophrenia disorganized type  versus MDD with psychosis, not improving as expected, patient seems brighter today, but thought blocking but remained disorganized with visual hallucinations. Will monitor response to  Geodon to 40 mg twice a day. We monitor for daytime sedation, acute dystonia, akathisia or over activation.. MDD, seems brighter but verbalized high level of depression and sadness. Would continue Lexapro 10 mg today and titrate to 20 mg tomorrow morning., we'll continue Lexapro 10 mg daily  Insomnia, no improving, will increase trazodone to 100 mg at bedtime.. Monitor for daytime sedation Constipation, improving  - Therapy: Patient to continue to participate in group therapy, family therapies, communication skills training, separation and individuation therapies, coping skills training. - Social worker to contact family to further obtain collateral along with setting of family therapy and outpatient treatment at the time of discharge.   Philipp Ovens, MD 06/08/2017, 11:40 AMPatient ID: Valerie [name], female   DOB: 1996-01-06, 21 y.o.   MRN: [id number]

## 2017-06-08 NOTE — Progress Notes (Signed)
Child/Adolescent Psychoeducational Group Note  Date:  06/08/2017 Time:  6:32 PM  Group Topic/Focus:  Goals Group:   The focus of this group is to help patients establish daily goals to achieve during treatment and discuss how the patient can incorporate goal setting into their daily lives to aide in recovery.  Participation Level:  Minimal  Participation Quality:  Inattentive  Affect:  Flat  Cognitive:  Disorganized and Confused  Insight:  Limited  Engagement in Group:  Limited  Modes of Intervention:  Activity and Education  Additional Comments:   Pt attended goals group this morning. Pt did not share much in group and appears to be confused. Pt is focused on sports and shared her goal is to get better with sports. Pt has bizarre behavior. Pt denies SI/Hi at this time.   Riyanna Crutchley A 06/08/2017, 6:32 PM

## 2017-06-08 NOTE — Progress Notes (Signed)
Pt has been up repeatedly after 2 am. Pt has complained of leg and stomach cramps and reported she has started her menstrual cycle. Heat packs provided. Pt was unable to sleep even with the prn Trazodone given.

## 2017-06-08 NOTE — Progress Notes (Signed)
Child/Adolescent Psychoeducational Group Note  Date:  06/08/2017 Time:  2:58 AM  Group Topic/Focus:  Wrap-Up Group:   The focus of this group is to help patients review their daily goal of treatment and discuss progress on daily workbooks.  Participation Level:  None  Participation Quality:  Drowsy  Affect:  Flat  Cognitive:  Appropriate  Insight:  None  Engagement in Group:  None  Modes of Intervention:  Discussion and Support  Additional Comments: Pt attended group but did not participate.    Terrial Rhodes 06/08/2017, 2:58 AM

## 2017-06-09 MED ORDER — ZIPRASIDONE HCL 40 MG PO CAPS: 40.0000 mg | ORAL_CAPSULE | Freq: Two times a day (BID) | ORAL | 0 refills | Status: DC

## 2017-06-09 MED ORDER — TRAZODONE HCL 100 MG PO TABS: 100.0000 mg | ORAL_TABLET | Freq: Every evening | ORAL | 0 refills | Status: DC | PRN

## 2017-06-09 MED ORDER — ESCITALOPRAM OXALATE 20 MG PO TABS: 20.0000 mg | ORAL_TABLET | Freq: Every day | ORAL | 0 refills | Status: DC

## 2017-06-09 MED ORDER — PANTOPRAZOLE SODIUM 40 MG PO TBEC
40.0000 mg | DELAYED_RELEASE_TABLET | Freq: Every day | ORAL | Status: DC
  Administered 2017-06-09 – 2017-06-10 (×2): 40 mg via ORAL
  Filled 2017-06-09 (×5): qty 1

## 2017-06-09 MED ORDER — PANTOPRAZOLE SODIUM 40 MG PO TBEC: 40.0000 mg | DELAYED_RELEASE_TABLET | Freq: Every day | ORAL | 0 refills | Status: DC
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
## 2017-06-09 NOTE — [facility] Group Notes (Signed)
[facility] LCSW Group Therapy Note   Date/Time: 06/09/2017 2:59 PM   Type of Therapy and Topic: Group Therapy: Holding on to Grudges   Participation Level: active   Participation Quality: Appropriate   Description of Group:  In this group patients will be asked to explore and define a grudge. Patients will be guided to discuss their thoughts, feelings, and behaviors as to why one holds on to grudges and reasons why people have grudges. Patients will process the impact grudges have on daily life and identify thoughts and feelings related to holding on to grudges. Facilitator will challenge patients to identify ways of letting go of grudges and the benefits once released. Patients will be confronted to address why one struggles letting go of grudges. Lastly, patients will identify feelings and thoughts related to what life would look like without grudges. This group will be process-oriented, with patients participating in exploration of their own experiences as well as giving and receiving support and challenge from other group members.   Therapeutic Goals:  1. Patient will identify specific grudges related to their personal life.  2. Patient will identify feelings, thoughts, and beliefs around grudges.  3. Patient will identify how one releases grudges appropriately.  4. Patient will identify situations where they could have let go of the grudge, but instead chose to hold on.   Summary of Patient Progress Group members defined grudges and provided reasons people hold on and let go of grudges. Patient participated in free writing to process a current grudge. Patient participated in small group discussion on why people hold onto grudges, benefits of letting go of grudges and coping skills to help let go of grudges.     Therapeutic Modalities:  Cognitive Behavioral Therapy  Solution Focused Therapy  Motivational Interviewing  Brief Therapy   Evaro MSW, LCSW

## 2017-06-09 NOTE — [facility] Group Notes (Signed)
Pt attended group on loss and grief facilitated by Simone Curia, MDiv.   Group goal of identifying grief patterns, naming feelings / responses to grief, identifying behaviors that may emerge from grief responses, identifying when one may call on an ally or coping skill.  Following introductions and group rules, group opened with psycho-social ed. identifying types of loss (relationships / self / things) and identifying patterns, circumstances, and changes that precipitate losses. Group members spoke about losses they had experienced and the effect of those losses on their lives. Identified thoughts / feelings around this loss, working to share these with one another in order to normalize grief responses, as well as recognize variety in grief experience.   Group looked at illustration of journey of grief and group members identified where they felt like they are on this journey. Identified ways of caring for themselves.   Group facilitation drew on brief cognitive behavioral and Adlerian Latoya Maulding was present throughout group.   Attentive to group members.  Engaged with facilitator when prompted

## 2017-06-09 NOTE — Progress Notes (Signed)
Recreation Therapy Notes  Date: 08.16.2018 Time: 10:30am  Location: [address] Dayroom   Group Topic: Leisure Education  Goal Area(s) Addresses:  Patient will identify positive leisure activities.  Patient will identify one positive benefit of participation in leisure activities.   Behavioral Response: Engaged, Attentive, Appropriate    Intervention: Presentation   Activity: In pairs patient was asked to create a game with their teammate. Team's were tasked with designing a game, including a Name, Description of Game, Equipment/Supplies, Rules, and Number of players needed.   Education:  Leisure Scientist, physiological, Interior and spatial designer Outcome: Acknowledges education.   Clinical Observations/Feedback: Patient spontaneously contributed to opening group discussion, sharing a story about her leisure participation with group. Patient actively engaged with teammate to create game, by encouraging her team to create a game with similar ideas as soccer. Patient made no contributions to processing discussion, but appeared to actively listen as she maintained appropriate eye contact with speaker.   Laureen Ochs Jaysa Kise, LRT/CTRS        Kasidy Gianino L 06/09/2017 1:11 PM

## 2017-06-09 NOTE — Progress Notes (Signed)
D: Patient denies SI/HI or AVH. Patient has a depressed mood and flat affect on initial presentation, she brightened up some talking about soccer which she really wants to play.  She reports that she tried out for the soccer team but didn't make it and was really disappointed by that.  States that she has done shot put and discus for track as well as 800 meter running but reports she wants to maybe try cross country running or try for the soccer team again.  She is excited about starting her senior year of high school at [employer] and hopes to attend college when she graduates.    A: Patient given emotional support from RN. Patient encouraged to come to staff with concerns and/or questions. Patient's medication routine continued. Patient's orders and plan of care reviewed.   R: Patient remains appropriate and cooperative. Will continue to monitor patient q15 minutes for safety.

## 2017-06-09 NOTE — BHH Group Notes (Signed)
[facility] LCSW Group Therapy Note  Date/Time: 06/08/17 1:30pm  Type of Therapy and Topic:  Group Therapy:  Who Am I?  Self Esteem, Self-Actualization and Understanding Self.  Participation Level: None  Description of Group:    In this group patients will be asked to explore values, beliefs, truths, and morals as they relate to personal self.  Patients will be guided to discuss their thoughts, feelings, and behaviors related to what they identify as important to their true self. Patients will process together how values, beliefs and truths are connected to specific choices patients make every day. Each patient will be challenged to identify changes that they are motivated to make in order to improve self-esteem and self-actualization. This group will be process-oriented, with patients participating in exploration of their own experiences as well as giving and receiving support and challenge from other group members.  Therapeutic Goals: 1. Patient will identify false beliefs that currently interfere with their self-esteem.  2. Patient will identify feelings, thought process, and behaviors related to self and will become aware of the uniqueness of themselves and of others.  3. Patient will be able to identify and verbalize values, morals, and beliefs as they relate to self. 4. Patient will begin to learn how to build self-esteem/self-awareness by expressing what is important and unique to them personally.  Summary of Patient Progress Patient entered group for about 10 mins. Patient appeared to be drowsy during group. Patient abruptly got up from group and left.    Therapeutic Modalities:   Cognitive Behavioral Therapy Solution Focused Therapy Motivational Interviewing Brief Therapy

## 2017-06-09 NOTE — Progress Notes (Signed)
[facility] MD Progress Note  06/09/2017 11:46 AM Valerie [name]  MRN:  [id number] Subjective:  "I'm better" Patient seen by this MD, case discussed with nursing and chart reviewed.  On evaluation in the unit, patient seems less restricted, more engaged, less thought blocking, endorses sleeping better with the increase of trazodone last night, endorsing feeling better in general, denies hearing any voices. At times remained tangential with her responses but seems less disorganized and does not  seems to be responding to internal stimuli during the evaluation this morning. She denies any auditory or visual hallucinations. Endorses feeling "nasty" due to having her menstrual period. Patient endorses some GERD symptoms. Endorse a good appetite. Patient remained with some concrete and immature thought processes but seems to present with significant improvement from admission. She endorses a good visitation with her parents. Consistently refuted any suicidal ideation intention or plan. Remained calm and cooperative, more aware of other people observing her at times and laughing when she asked questions that peers feels that the answer is obvious (no paranoid on this case, has been reported by SW in group and addressed already with her peers). This M.D. considered that patient may has underlying learning disability beside the presenting psychotic symptoms. Educated the family to monitor D some outpatient setting. During assessment patient does not seem with any akathisia, no stiffness on physical exam and no signs or symptoms reported of EPS.   Principal Problem: Psychosis Diagnosis:   Patient Active Problem List   Diagnosis Date Noted  . Psychosis [F29] 06/03/2017    Priority: High  . MDD (major depressive disorder), recurrent episode, severe ([place]) [F33.2] 06/02/2017    Priority: High   Total Time spent with patient: 25 minutes and 50% of the time was use to provide counseling regarding medications and side  effects.  Past Psychiatric History:              Outpatient: Seeing Pediatric NP, Dian Situ; Father denies any past outpatient therapy              Inpatient: None              Past medication trial:abilify and lexapro, with good response, dc due to insurance lack and increase on weight with abilify.              Past SA: None                           Psychological testing: None  Medical Problems:             Allergies: Sulfa Antibiotics             Surgeries: None             Head trauma:None             STD: None   Family Psychiatric history: None  Past Medical History:  Past Medical History:  Diagnosis Date  . Anxiety   . Depression   . Fatigue   . Headache   . Psychosis 06/03/2017   History reviewed. No pertinent surgical history. Family History: History reviewed. No pertinent family history.  Social History:  History  Alcohol Use No     History  Drug Use No    Social History   Social History  . Marital status: Single    Spouse name: N/A  . Number of children: N/A  . Years of education: N/A   Social History Main Topics  .  Smoking status: Never Smoker  . Smokeless tobacco: Never Used  . Alcohol use No  . Drug use: No  . Sexual activity: No   Other Topics Concern  . None   Social History Narrative  . None   Additional Social History:    Pain Medications: not abusing Prescriptions: not abusing Over the Counter: not abusing History of alcohol / drug use?: No history of alcohol / drug abuse Longest period of sobriety (when/how long): denies Negative Consequences of Use:  (denies) Withdrawal Symptoms:  (denies)        Current Medications: Current Facility-Administered Medications  Medication Dose Route Frequency Provider Last Rate Last Dose  . alum & mag hydroxide-simeth (MAALOX/MYLANTA) 200-200-20 MG/5ML suspension 30 mL  30 mL Oral Q6H PRN Money, Darnelle Maffucci B, FNP      . escitalopram (LEXAPRO) tablet 20 mg  20 mg Oral Daily  Valda Lamb, Prentiss Bells, MD   20 mg at 06/09/17 0827  . ibuprofen (ADVIL,MOTRIN) tablet 600 mg  600 mg Oral Q6H PRN Valda Lamb, Prentiss Bells, MD   600 mg at 06/08/17 1426  . magnesium hydroxide (MILK OF MAGNESIA) suspension 5 mL  5 mL Oral QHS PRN Money, Lowry Ram, FNP   5 mL at 06/06/17 0710  . norgestimate-ethinyl estradiol (ORTHO-CYCLEN,SPRINTEC,PREVIFEM) 0.25-35 MG-MCG tablet 1 tablet  1 tablet Oral QHS Valda Lamb, Prentiss Bells, MD   1 tablet at 06/08/17 2100  . traZODone (DESYREL) tablet 100 mg  100 mg Oral QHS PRN Valda Lamb, Prentiss Bells, MD   100 mg at 06/08/17 2009  . ziprasidone (GEODON) capsule 40 mg  40 mg Oral BID WC Valda Lamb, Prentiss Bells, MD   40 mg at 06/09/17 0827    Lab Results:  No results found for this or any previous visit (from the past 93 hour(s)).  Blood Alcohol level:  No results found for: [facility]  Metabolic Disorder Labs: Lab Results  Component Value Date   HGBA1C 5.3 06/03/2017   MPG 105.41 06/03/2017   Lab Results  Component Value Date   PROLACTIN 79.1 (H) 06/04/2017   Lab Results  Component Value Date   CHOL 172 (H) 06/04/2017   TRIG 144 06/04/2017   HDL 69 06/04/2017   CHOLHDL 2.5 06/04/2017   VLDL 29 06/04/2017   LDLCALC 74 06/04/2017    Physical Findings: AIMS: Facial and Oral Movements Muscles of Facial Expression: None, normal Lips and Perioral Area: None, normal Jaw: None, normal Tongue: None, normal,Extremity Movements Upper (arms, wrists, hands, fingers): None, normal Lower (legs, knees, ankles, toes): None, normal, Trunk Movements Neck, shoulders, hips: None, normal, Overall Severity Severity of abnormal movements (highest score from questions above): None, normal Incapacitation due to abnormal movements: None, normal Patient's awareness of abnormal movements (rate only patient's report): No Awareness, Dental Status Current problems with teeth and/or dentures?: No Does patient usually wear dentures?: No  CIWA:     COWS:     Musculoskeletal: Strength & Muscle Tone: within normal limits Gait & Station: normal Patient leans: N/A  Psychiatric Specialty Exam: Physical Exam  Psychiatric: Her affect is blunt. Her speech is delayed and tangential.  Thought-blocking is improved, but occasionally persists.     Review of Systems  Constitutional: Negative for malaise/fatigue.  Cardiovascular: Negative for chest pain and palpitations.  Gastrointestinal: Positive for heartburn. Negative for abdominal pain, constipation, diarrhea, nausea and vomiting.  Genitourinary: Negative for dysuria, frequency, hematuria and urgency.  Musculoskeletal: Negative for back pain, myalgias and neck pain.  Neurological: Negative for dizziness, tingling, tremors, sensory change  and headaches.  Psychiatric/Behavioral: Positive for depression and hallucinations (visual). Negative for substance abuse and suicidal ideas. The patient is nervous/anxious and has insomnia.        "less distracted"  All other systems reviewed and are negative.   Blood pressure 105/72, pulse 96, temperature 98.2 F (36.8 C), temperature source Oral, resp. rate 16, height 5' 0.63" (1.54 m), weight 66.5 kg (146 lb 9.7 oz), SpO2 100 %.Body mass index is 28.04 kg/m.  General Appearance: better GA, less disheveled, cleaner hair and hair do, more aware of her surroundings  Eye Contact:fair   Speech: More organized today with normal rate and rythm  Volume:  normal  Mood:  "more better"  Affect:  Restricted but improving  Thought Process:  Descriptions of Associations: Tangential less TP, less tangentiallity  Orientation:  AOX3  Thought Content:  Logical, less Thought blocking and does not seems to be responding to internal stimuli. Denies [place] today  Suicidal Thoughts:  No,Contracting for safety in the unit   Homicidal Thoughts:  No  Memory:  poor  Judgement:  Other:  improving  Insight:  shallow  Psychomotor Activity:  normal  Concentration:   Concentration: Poor  Recall:  Poor  Fund of Knowledge:  Poor  Language:  Fair  Akathisia:  No  Handed:  Right  AIMS (if indicated):     Assets:  Catering manager Housing Physical Health Social Support  ADL's:  Impaired  Cognition:  Impaired,  Mild, seems having LD versus ID as baseline, unclear due to current psychosis  Sleep:       Treatment Plan Summary: - Daily contact with patient to assess and evaluate symptoms and progress in treatment and Medication management -Safety:  Patient contracts for safety on the unit, To continue every 15 minute checks - Labs reviewed:  No new labs - To reduce current symptoms to base line and improve the patient's overall level of functioning will adjust Medication management as follow: Psychosis, rule out schizophrenia disorganized type  versus MDD with psychosis, Improvement reported and observed, Will monitor response to  Geodon to 40 mg twice a day. We monitor for daytime sedation, acute dystonia, akathisia or over activation.. MDD, seems brighter, tolerating increase of lexapro this am, Would continue Lexapro 20 mg daily, monitor of GI symptoms Insomnia, mild improvement with increase of trazodone to 146m, no daytime sedation reported this morning. We will continue to monitor current dose Constipation, improving BM reported yesteray  - Therapy: Patient to continue to participate in group therapy, family therapies, communication skills training, separation and individuation therapies, coping skills training. - Social worker to contact family to further obtain collateral along with setting of family therapy and outpatient treatment at the time of discharge.   MPhilipp Ovens MD 06/09/2017, 11:46 AMPatient ID: FThurman Coyer female   DOB: [birth year] 21y.o.   MRN:

## 2017-06-10 NOTE — BHH Suicide Risk Assessment (Signed)
[facility] INPATIENT:  Family/Significant Other Suicide Prevention Education  Suicide Prevention Education:  Education Completed; Kassady Laboy (mother) has been identified by the patient as the family member/significant other with whom the patient will be residing, and identified as the person(s) who will aid the patient in the event of a mental health crisis (suicidal ideations/suicide attempt).  With written consent from the patient, the family member/significant other has been provided the following suicide prevention education, prior to the and/or following the discharge of the patient.  The suicide prevention education provided includes the following:  Suicide risk factors  Suicide prevention and interventions  National Suicide Hotline telephone number  [facility] assessment telephone number  [facility] Emergency Assistance [place] and/or Residential Mobile Crisis Unit telephone number  Request made of family/significant other to:  Remove weapons (e.g., guns, rifles, knives), all items previously/currently identified as safety concern.    Remove drugs/medications (over-the-counter, prescriptions, illicit drugs), all items previously/currently identified as a safety concern.  The family member/significant other verbalizes understanding of the suicide prevention education information provided.  The family member/significant other agrees to remove the items of safety concern listed above.  [place] MSW, LCSW  06/10/2017, 9:59 AM

## 2017-06-10 NOTE — Progress Notes (Signed)
D: Patient verbalizes readiness for discharge. Denies suicidal and homicidal ideations. Denies auditory and visual hallucinations.  No complaints of pain.  A:  Both parent and patient receptive to discharge instructions. Questions encouraged, both verbalize understanding.  R:  Escorted to the lobby by this RN.

## 2017-06-10 NOTE — Progress Notes (Signed)
[facility] Child/Adolescent Case Management Discharge Plan :  Will you be returning to the same living situation after discharge: Yes,  home  At discharge, do you have transportation home?:Yes,  mother  Do you have the ability to pay for your medications:Yes,  insurance   Release of information consent forms completed and in the chart;  Patient's signature needed at discharge.  Patient to Follow up at: Follow-up [place] Follow up on 06/16/2017.   Why:  Patient is current with this provider for medication management and therapy. Patient sees MD [name] and next appt is 06/16/17 at 11:00am. Patient will be seen on 06/22/17 at 1:00pm. Patient is on cancellation list for earlier appointment.  Contact information: [address]  [employer] [place],  [place]  [address] Phone: [phone] Fax: [phone]          Family Contact:  Face to Face:  Attendees:  Clinton Quant  Safety Planning and Suicide Prevention discussed:  Yes,  with patient and mother    Wray Kearns MSW, LCSW  06/10/2017, 10:25 AM

## 2017-06-10 NOTE — BHH Suicide Risk Assessment (Signed)
[facility] Discharge Suicide Risk Assessment   Principal Problem: Psychosis Discharge Diagnoses:  Patient Active Problem List   Diagnosis Date Noted  . Psychosis [F29] 06/03/2017    Priority: High  . MDD (major depressive disorder), recurrent episode, severe ([place]) [F33.2] 06/02/2017    Priority: High    Total Time spent with patient: 15 minutes  Musculoskeletal: Strength & Muscle Tone: within normal limits Gait & Station: normal Patient leans: N/A  Psychiatric Specialty Exam: Review of Systems  Gastrointestinal: Positive for constipation (improving). Negative for abdominal pain, diarrhea, heartburn, nausea and vomiting.  Musculoskeletal: Negative for myalgias and neck pain.  Neurological: Negative for dizziness, tingling and headaches.  Psychiatric/Behavioral: Positive for depression (improving). Negative for hallucinations, substance abuse and suicidal ideas. The patient is nervous/anxious. The patient does not have insomnia.   All other systems reviewed and are negative.   Blood pressure 102/64, pulse 100, temperature 97.9 F (36.6 C), temperature source Oral, resp. rate 16, height 5' 0.63" (1.54 m), weight 66.5 kg (146 lb 9.7 oz), SpO2 100 %.Body mass index is 28.04 kg/m.  General Appearance: improved, more engaged and aware of surroundings, pleasant and cooperative, alert and oriented and does not seems to actively responding to internal stimuli  Eye Contact::  Good  Speech:  Clear and Coherent, normal rate  Volume:  Normal  Mood:  "better, ready to go home  Affect:  Brighten on approach  Thought Process:  Goal Directed, Intact, Linear and Logical  Orientation:  Full (Time, Place, and Person)  Thought Content:  Denies any A/VH, no delusions elicited, no preoccupations or ruminations  Suicidal Thoughts:  No  Homicidal Thoughts:  No  Memory:  good  Judgement:  Fair  Insight:  Present but shallow  Psychomotor Activity:  Normal  Concentration:  Fair  Recall:  Good  Fund of  Knowledge:Fair  Language: Good  Akathisia:  No  Handed:  Right  AIMS (if indicated):     Assets:  Communication Skills Desire for Improvement Financial Resources/Insurance Housing Physical Health Resilience Social Support Vocational/Educational  ADL's:  Intact  Cognition: seems to present with some baseline limitations, borderline IQ vs ID?                                                       Mental Status Per Nursing Assessment::   On Admission:  NA  Demographic Factors:  NA  Loss Factors: Decrease in vocational status and Decline in physical health  Historical Factors: Impulsivity  Risk Reduction Factors:   Sense of responsibility to family, Religious beliefs about death, Living with another person, especially a relative, Positive social support and Positive coping skills or problem solving skills  Continued Clinical Symptoms:  Depression:   Impulsivity More than one psychiatric diagnosis Previous Psychiatric Diagnoses and Treatments  Cognitive Features That Contribute To Risk:  Closed-mindedness and Polarized thinking    Suicide Risk:  Minimal: No identifiable suicidal ideation.  Patients presenting with no risk factors but with morbid ruminations; may be classified as minimal risk based on the severity of the depressive symptoms  Follow-up [place] Follow up on 06/16/2017.   Why:  Patient is current with this provider for medication management and therapy. Patient sees MD [name] and next appt is 06/16/17 at 11:00am. Patient will be seen on  06/22/17 at 1:00pm. Patient is on cancellation list for earlier appointment.  Contact information: [address] [place]  [place],  [name]  [address] Phone: [phone] Fax: [phone]          Plan Of Care/Follow-up recommendations:  Patient seen by this MD. At time of discharge, consistently refuted any suicidal ideation,  intention or plan, denies any Self harm urges. Denies any A/VH and no delusions were elicited and does not seem to be responding to internal stimuli. During assessment the patient is able to verbalize appropriated coping skills and safety plan to use on return home. Patient verbalizes intent to be compliant with medication and outpatient services.   Philipp Ovens, MD 06/10/2017, 10:58 AM

## 2017-06-10 NOTE — Tx Team (Signed)
Interdisciplinary Treatment and Diagnostic Plan Update  06/10/2017 Time of Session: 9:56 AM  Valerie [name] MRN: [id number]  Principal Diagnosis: Psychosis  Secondary Diagnoses: Principal Problem:   Psychosis Active Problems:   MDD (major depressive disorder), recurrent episode, severe ([place])   Current Medications:  Current Facility-Administered Medications  Medication Dose Route Frequency Provider Last Rate Last Dose  . alum & mag hydroxide-simeth (MAALOX/MYLANTA) 200-200-20 MG/5ML suspension 30 mL  30 mL Oral Q6H PRN Money, Darnelle Maffucci B, FNP      . escitalopram (LEXAPRO) tablet 20 mg  20 mg Oral Daily Valda Lamb, Prentiss Bells, MD   20 mg at 06/10/17 0810  . ibuprofen (ADVIL,MOTRIN) tablet 600 mg  600 mg Oral Q6H PRN Valda Lamb, Prentiss Bells, MD   600 mg at 06/08/17 1426  . magnesium hydroxide (MILK OF MAGNESIA) suspension 5 mL  5 mL Oral QHS PRN Money, Lowry Ram, FNP   5 mL at 06/06/17 0710  . norgestimate-ethinyl estradiol (ORTHO-CYCLEN,SPRINTEC,PREVIFEM) 0.25-35 MG-MCG tablet 1 tablet  1 tablet Oral QHS Valda Lamb, Prentiss Bells, MD   1 tablet at 06/09/17 2018  . pantoprazole (PROTONIX) EC tablet 40 mg  40 mg Oral Daily Valda Lamb, Prentiss Bells, MD   40 mg at 06/10/17 0810  . traZODone (DESYREL) tablet 100 mg  100 mg Oral QHS PRN Valda Lamb, Prentiss Bells, MD   100 mg at 06/09/17 2017  . ziprasidone (GEODON) capsule 40 mg  40 mg Oral BID WC Valda Lamb, Prentiss Bells, MD   40 mg at 06/10/17 0810    PTA Medications: Prescriptions Prior to Admission  Medication Sig Dispense Refill Last Dose  . SPRINTEC 28 0.25-35 MG-MCG tablet Take 1 tablet by mouth daily.    06/01/2017 at Unknown time    Treatment Modalities: Medication Management, Group therapy, Case management,  1 to 1 session with clinician, Psychoeducation, Recreational therapy.   Physician Treatment Plan for Primary Diagnosis: Psychosis Long Term Goal(s): Improvement in symptoms so as ready for discharge  Short Term  Goals: Ability to identify changes in lifestyle to reduce recurrence of condition will improve, Ability to verbalize feelings will improve, Ability to disclose and discuss suicidal ideas, Ability to demonstrate self-control will improve, Ability to identify and develop effective coping behaviors will improve and Ability to maintain clinical measurements within normal limits will improve  Medication Management: Evaluate patient's response, side effects, and tolerance of medication regimen.  Therapeutic Interventions: 1 to 1 sessions, Unit Group sessions and Medication administration.  Evaluation of Outcomes: Progressing  Physician Treatment Plan for Secondary Diagnosis: Principal Problem:   Psychosis Active Problems:   MDD (major depressive disorder), recurrent episode, severe ([place])   Long Term Goal(s): Improvement in symptoms so as ready for discharge  Short Term Goals: Ability to identify changes in lifestyle to reduce recurrence of condition will improve, Ability to verbalize feelings will improve, Ability to disclose and discuss suicidal ideas, Ability to demonstrate self-control will improve, Ability to identify and develop effective coping behaviors will improve and Ability to maintain clinical measurements within normal limits will improve  Medication Management: Evaluate patient's response, side effects, and tolerance of medication regimen.  Therapeutic Interventions: 1 to 1 sessions, Unit Group sessions and Medication administration.  Evaluation of Outcomes: Progressing   RN Treatment Plan for Primary Diagnosis: Psychosis Long Term Goal(s): Knowledge of disease and therapeutic regimen to maintain health will improve  Short Term Goals: Ability to remain free from injury will improve and Compliance with prescribed medications will improve  Medication Management: RN will administer medications as ordered  by provider, will assess and evaluate patient's response and provide education  to patient for prescribed medication. RN will report any adverse and/or side effects to prescribing provider.  Therapeutic Interventions: 1 on 1 counseling sessions, Psychoeducation, Medication administration, Evaluate responses to treatment, Monitor vital signs and CBGs as ordered, Perform/monitor CIWA, COWS, AIMS and Fall Risk screenings as ordered, Perform wound care treatments as ordered.  Evaluation of Outcomes: Progressing   LCSW Treatment Plan for Primary Diagnosis: Psychosis Long Term Goal(s): Safe transition to appropriate next level of care at discharge, Engage patient in therapeutic group addressing interpersonal concerns.  Short Term Goals: Engage patient in aftercare planning with referrals and resources, Increase ability to appropriately verbalize feelings, Facilitate acceptance of mental health diagnosis and concerns and Identify triggers associated with mental health/substance abuse issues  Therapeutic Interventions: Assess for all discharge needs, conduct psycho-educational groups, facilitate family session, explore available resources and support systems, collaborate with current community supports, link to needed community supports, educate family/caregivers on suicide prevention, complete Psychosocial Assessment.   Evaluation of Outcomes: Progressing   Progress in Treatment: Attending groups: Yes Participating in groups: Yes Taking medication as prescribed: Yes, MD continues to assess for medication changes as needed Toleration medication: Yes, no side effects reported at this time Family/Significant other contact made:  Patient understands diagnosis:  Discussing patient identified problems/goals with staff: Yes Medical problems stabilized or resolved: Yes Denies suicidal/homicidal ideation:  Issues/concerns per patient self-inventory: None Other: N/A  New problem(s) identified: None identified at this time.   New Short Term/Long Term Goal(s): None identified at  this time.   Discharge Plan or Barriers:   8/10: Patient reports "my mom is what bought me into the hospital". Patient reports she does not know what she wants to work on while here in the hospital.   Reason for Continuation of Hospitalization: Psychosis  Depression Medication stabilization Suicidal ideation   Estimated Length of Stay: 8/17  Attendees: Patient:  06/10/2017  9:56 AM  Physician: Hinda Kehr, MD 06/10/2017  9:56 AM  Nursing: Sharyn Lull RN 06/10/2017  9:56 AM  RN Care Manager: Skipper Cliche, UR RN 06/10/2017  9:56 AM  Social Worker: Stetson Pelaez L Corrina Steffensen,LCSW 06/10/2017  9:56 AM  Recreational Therapist: Ronald Lobo, LRT 06/10/2017  9:56 AM  Other:  06/10/2017  9:56 AM  Other:  06/10/2017  9:56 AM  Other: 06/10/2017  9:56 AM    Scribe for Treatment Team: Wray Kearns MSW, LCSW  06/10/2017

## 2017-06-16 ENCOUNTER — Ambulatory Visit (INDEPENDENT_AMBULATORY_CARE_PROVIDER_SITE_OTHER): Payer: 59 | Admitting: Medical

## 2017-06-16 ENCOUNTER — Encounter (HOSPITAL_COMMUNITY): Payer: Self-pay | Admitting: Medical

## 2017-06-16 VITALS — BP 120/72 | HR 96 | Resp 16 | Ht 60.5 in | Wt 147.0 lb

## 2017-06-16 DIAGNOSIS — Z818 Family history of other mental and behavioral disorders: Secondary | ICD-10-CM

## 2017-06-16 DIAGNOSIS — R443 Hallucinations, unspecified: Secondary | ICD-10-CM

## 2017-06-16 DIAGNOSIS — F22 Delusional disorders: Secondary | ICD-10-CM

## 2017-06-16 DIAGNOSIS — F29 Unspecified psychosis not due to a substance or known physiological condition: Secondary | ICD-10-CM | POA: Diagnosis not present

## 2017-06-16 DIAGNOSIS — F419 Anxiety disorder, unspecified: Secondary | ICD-10-CM | POA: Diagnosis not present

## 2017-06-16 DIAGNOSIS — Z8659 Personal history of other mental and behavioral disorders: Secondary | ICD-10-CM | POA: Diagnosis not present

## 2017-06-16 DIAGNOSIS — R12 Heartburn: Secondary | ICD-10-CM

## 2017-06-16 DIAGNOSIS — F325 Major depressive disorder, single episode, in full remission: Secondary | ICD-10-CM | POA: Diagnosis not present

## 2017-06-16 MED ORDER — ESCITALOPRAM OXALATE 20 MG PO TABS: 20.0000 mg | ORAL_TABLET | Freq: Every day | ORAL | 1 refills | Status: DC

## 2017-06-16 MED ORDER — SIMETHICONE 80 MG PO CHEW: 80.0000 mg | CHEWABLE_TABLET | Freq: Four times a day (QID) | ORAL | 0 refills | Status: DC | PRN

## 2017-06-16 MED ORDER — TRAZODONE HCL 50 MG PO TABS: 50.0000 mg | ORAL_TABLET | Freq: Every day | ORAL | 1 refills | Status: DC

## 2017-06-16 MED ORDER — ZIPRASIDONE HCL 40 MG PO CAPS: 40.0000 mg | ORAL_CAPSULE | Freq: Two times a day (BID) | ORAL | 1 refills | Status: DC

## 2017-06-16 NOTE — Progress Notes (Signed)
BH MD/PA/NP OP Progress Note  06/16/2017 11:29 AM Valerie [name]  MRN:  [id number]  Subjective:" I dont like the medicine (Trazodone) Chief Complaint:  Chief Complaint    Follow-up; Altered Mental Status; Depression     Visit Diagnosis:     ICD-10-CM   1. Psychosis, paranoid ([place]) [place]   2. Major depressive disorder, single episode, in remission ([place]) F32.5   3. Anxiety in pediatric patient F41.9   4. History of OCD (obsessive compulsive disorder) Z86.59   HPI: At last visit: Pt returns with mother and unfortunately has had another psychotic break which began 3 weeks ago in [place] while visiting. Pt's mother did call at that time and was advised to take pt to Hospital but she hasnt been to date. Mom reports daughter has begun to have vegetative signs especially around hyd giene. She has OC signs around urge to urinatwe with negative exam for UTI/Diabetes.Pt reports hearing voices. Mom says she has been paranoid about Mom and she has refused to answer the phone when parents call to check on her.She does not sleep  Today Valerie [name] returns with her mother having been discharged from Hospital for FU and Medication management. Starts school next week   Past Psychiatric History OUTPATIENT 03/20/2015 [place] Patient and father noted that 1st episode began in November with a few days of irritability, anxiety, depression, and anger. This resolved.  She has had two more severe episodes since then between February and now where she is irritable, short tempered, appears to be responding to internal stimulation, reporting auditory hallucinations, cursing at family. Her grades have dropped from As and Bs to possibly needing Summer school. Elements:  Location:  psychosis. Quality:  acute. Severity:  severe. Timing:  on going x 6 months. Duration:  6 months. Context:  auditory command hallucinations, irritability, mood changes with paranoia.. Associated  Signs/Symptoms: Depression Symptoms:  depressed mood, anhedonia, insomnia, psychomotor agitation, fatigue, difficulty concentrating, hopelessness, anxiety, panic attacks, loss of energy/fatigue, disturbed sleep, (Hypo) Manic Symptoms:  Distractibility, Hallucinations, Anxiety Symptoms:  Excessive Worry, Panic Symptoms, Psychotic Symptoms:  Hallucinations: Auditory Command:  to hurt herself, never others, has never acted on them PTSD Symptoms: Had a traumatic exposure:  her brother died suddenly from an accident when she was 37 Followed from 03/20/2015 to 02/24/2017 when she appeared in complete remission  INPATIENT Date of Admission:  06/02/2017 Date of Discharge: 06/10/2017 Discharge Diagnoses:    Diagnosis Date Noted  . Psychosis [F29] 06/03/2017  . MDD (major depressive disorder), recurrent episode, severe ([place]) [F33.2] 06/02/2017   Past Medical History:  Past Medical History:  Diagnosis Date  . Anxiety   . Depression   . Fatigue   . Headache   . Psychosis 06/03/2017   No past surgical history on file.  Family History: Medical History Relation Name Comments  Mental illness Maternal Uncle     Relation Name Status Comments  Father  Alive   Maternal Uncle     Mother  Alive    Social History:  Social History   Social History  . Marital status: Single    Spouse name: N/A  . Number of children: N/A  . Years of education: HS sophomore  behind 2 grades, she entered school late and then failed a grade(   Social History Main Topics  . Smoking status: Never Smoker  . Smokeless tobacco: Never Used  . Alcohol use No  . Drug use: No  . Sexual activity: No  Other Topics Concern  . Developmental History: Prenatal History:   Birth History:   Postnatal Infancy:   Developmental History:   Milestones:  Sit-Up:    Crawl:   Walk:   Speech: School History: [employer]   Social History Narrative  . None   Additional History:   Is in the 10th grade at  [employer]    nt is behind 2 grades, she entered school late and then failed a grade  Current Medications: Current Outpatient Prescriptions  Medication Sig Dispense Refill  . escitalopram (LEXAPRO) 20 MG tablet Take 1 tablet (20 mg total) by mouth daily. 30 tablet 1  . pantoprazole (PROTONIX) 40 MG tablet Take 1 tablet (40 mg total) by mouth daily. 30 tablet 0  . SPRINTEC 28 0.25-35 MG-MCG tablet Take 1 tablet by mouth daily.     . traZODone (DESYREL) 50 MG tablet Take 1 tablet (50 mg total) by mouth at bedtime. 30 tablet 1  . ziprasidone (GEODON) 40 MG capsule Take 1 capsule (40 mg total) by mouth 2 (two) times daily with a meal. 60 capsule 1  . simethicone (GAS-X) 80 MG chewable tablet Chew 1 tablet (80 mg total) by mouth every 6 (six) hours as needed for flatulence. 30 tablet 0   No current facility-administered medications for this visit.     Allergies:  Allergies  Allergen Reactions  . Sulfa Antibiotics Hives and Itching    Hives on face    Metabolic Disorder Labs: Lab Results  Component Value Date   HGBA1C 5.3 06/03/2017   MPG 105.41 06/03/2017   Lab Results  Component Value Date   PROLACTIN 79.1 (H) 06/04/2017   Lab Results  Component Value Date   CHOL 172 (H) 06/04/2017   TRIG 144 06/04/2017   HDL 69 06/04/2017   CHOLHDL 2.5 06/04/2017   VLDL 29 06/04/2017   LDLCALC 74 06/04/2017   Lab Results  Component Value Date   TSH 2.863 06/03/2017    Therapeutic Level Labs: No results found for: LITHIUM No results found for: VALPROATE No components found for:  CBMZ  Current Medications: Current Outpatient Prescriptions  Medication Sig Dispense Refill  . escitalopram (LEXAPRO) 20 MG tablet Take 1 tablet (20 mg total) by mouth daily. 30 tablet 1  . pantoprazole (PROTONIX) 40 MG tablet Take 1 tablet (40 mg total) by mouth daily. 30 tablet 0  . SPRINTEC 28 0.25-35 MG-MCG tablet Take 1 tablet by mouth daily.     . traZODone (DESYREL) 50 MG tablet Take  1 tablet (50 mg total) by mouth at bedtime. 30 tablet 1  . ziprasidone (GEODON) 40 MG capsule Take 1 capsule (40 mg total) by mouth 2 (two) times daily with a meal. 60 capsule 1  . simethicone (GAS-X) 80 MG chewable tablet Chew 1 tablet (80 mg total) by mouth every 6 (six) hours as needed for flatulence. 30 tablet 0   No current facility-administered medications for this visit.    Musculoskeletal: Strength & Muscle Tone: within normal limits Gait & Station: normal Patient leans: N/A  Psychiatric Specialty Exam: Review of Systems  Constitutional: Negative for chills, diaphoresis, fever, malaise/fatigue and weight loss.  HENT: Negative for congestion, ear discharge, ear pain, hearing loss, nosebleeds, sinus pain, sore throat and tinnitus.   Eyes: Negative.   Respiratory: Negative.  Negative for stridor.   Cardiovascular: Negative.   Gastrointestinal: Positive for heartburn (Gas). Negative for abdominal pain, blood in stool, constipation, diarrhea, melena, nausea and vomiting.  Genitourinary: Positive  for frequency (Psychogenic). Negative for dysuria, flank pain, hematuria and urgency.  Musculoskeletal: Negative for back pain, falls, joint pain, myalgias and neck pain.  Skin: Negative for itching and rash.  Neurological: Negative for dizziness, tingling, tremors, sensory change, speech change, focal weakness, seizures, loss of consciousness, weakness and headaches.  Endo/Heme/Allergies: Negative for environmental allergies and polydipsia. Does not bruise/bleed easily.  Psychiatric/Behavioral: Positive for hallucinations (Mom says she still hears voices).    Blood pressure 120/72, pulse 96, resp. rate 16, height 5' 0.5" (1.537 m), weight 147 lb (66.7 kg), SpO2 99 %.Body mass index is 28.24 kg/m.  General Appearance: Well Groomed  Eye Contact:  Fair  Speech:  Clear and Coherent  Volume:  Normal  Mood:  Dysphoric and Distracted at times  Affect:  Restricted  Thought Process:  Coherent,  Goal Directed-wants to stop medications-focusesd on large Trazodone tablet but may also be trying to get Mom to stop Geodon? Mom reinforces by saying she needs to take meds to stay out of the [facility] definitely wants to do and Descriptions of Associations: Loose  Orientation:  Full (Time, Place, and Person)  Thought Content: Logical, Illogical and Hallucinations: Auditory Obsession with urination/using bathroom   Suicidal Thoughts:  No  Homicidal Thoughts:  No  Memory:  Impaired by Psychosis for complete details.   Judgement:  Impaired  Insight:  Lacking  Psychomotor Activity:  Slight Restlessness  Concentration:  Concentration: Fair and Attention Span: Fair  Recall:  Negative  Fund of Knowledge: Negative  Language: Good  Akathisia:  Negative  Handed:  Right  AIMS (if indicated): See Screening  Assets:  Desire for Improvement Financial Resources/Insurance Housing Physical Health Social Support Transportation Vocational/Educational  ADL's:  Impaired  Cognition: Impaired,  Mild and Moderate  Sleep:  Rx Trazodone    Screenings: AIMS     Admission (Discharged) from 06/02/2017 in [facility] Office Visit from 09/30/2016 in [facility] Total Score  0  0    AUDIT     Admission (Discharged) from 06/02/2017 in [facility] CHILD/[facility]  Alcohol Use Disorder Identification Test Final Score (AUDIT)  0       Assessment Continues to hallucinate and obsess.Dislikes large Trazodone tablet but doesnt sleep thru nite if she doesnt take something  Plan: Continues discharge meds except Trazodone 100 mg-try 50 mg dose  FU 2 weeks  Consider adding Anafranil HS for OCD symptoms .May be able to use for sleep as well?  [name], PA-C 06/16/2017, 11:28 AM

## 2017-06-22 ENCOUNTER — Ambulatory Visit (INDEPENDENT_AMBULATORY_CARE_PROVIDER_SITE_OTHER): Payer: 59 | Admitting: Licensed Clinical Social Worker

## 2017-06-22 ENCOUNTER — Encounter (HOSPITAL_COMMUNITY): Payer: Self-pay | Admitting: *Deleted

## 2017-06-22 DIAGNOSIS — F22 Delusional disorders: Secondary | ICD-10-CM

## 2017-06-22 NOTE — Progress Notes (Signed)
Session Time: 1:05pm-1:35pm    Therapist met with patient for a Comprehensive Clinical Assessment.  She was referred by [facility].  Patient was there for medication adjustments from 06/02/17 to 06/10/17.   Patient seemed confused about why she was here.  When first asked about why she had been hospitalized she said because she had gotten sick on an airplane.  Later she clarified that Dr [name] recommended she get assessed at the hospital. Therapist asked patient for permission to invite patient's mom to join them.  Patient gave consent.   Mom clarified reason for hospitalization.  She reported that she has seen a significant improvement with patient since her medications were adjusted. Neither patient nor her mother identified any concerns they felt needed to be addressed in therapy.   Therapist stressed the importance of taking the medications consistently.  Patient admitted she doesn't like taking them.  Concluded that therapy is not needed at this time.  Advised patient to continue medication management.

## 2017-06-30 ENCOUNTER — Encounter (HOSPITAL_COMMUNITY): Payer: Self-pay | Admitting: Medical

## 2017-06-30 ENCOUNTER — Ambulatory Visit (INDEPENDENT_AMBULATORY_CARE_PROVIDER_SITE_OTHER): Payer: 59 | Admitting: Medical

## 2017-06-30 VITALS — BP 116/68 | HR 99 | Resp 16 | Ht 60.5 in | Wt 152.0 lb

## 2017-06-30 DIAGNOSIS — F200 Paranoid schizophrenia: Secondary | ICD-10-CM | POA: Diagnosis not present

## 2017-06-30 DIAGNOSIS — Z8659 Personal history of other mental and behavioral disorders: Secondary | ICD-10-CM | POA: Diagnosis not present

## 2017-06-30 NOTE — Progress Notes (Addendum)
[place] MD/PA/NP OP Progress Note  06/30/2017 5:47 PM Valerie [name]  MRN:  [id number]  Subjective:" I dont feel like they trust me" Chief Complaint:  Chief Complaint    Follow-up; Paranoid     Visit Diagnosis:     ICD-10-CM   1. Paranoid schizophrenia ([place]) F20.0   2. History of OCD (obsessive compulsive disorder) Z86.59   HPI: At prior visit: Pt returns with mother and unfortunately has had another psychotic break which began 3 weeks ago in [place] while visiting. Pt's mother did call at that time and was advised to take pt to Hospital but she hasnt been to date. Mom reports daughter has begun to have vegetative signs especially around hyd giene. She has OC signs around urge to urinatwe with negative exam for UTI/Diabetes.Pt reports hearing voices. Mom says she has been paranoid about Mom and she has refused to answer the phone when parents call to check on her.She does not sleep  At Last Visit:  Valerie [name] returns with her mother having been discharged from Hospital for FU and Medication management. Starts school next week  TODAY: Valerie [name] returns for FU /Med Management having gone back to school.  She met with Counselor :06/22/2017 [place], Leary Roca, LCSW  Licensed Clinical Social Worker   Psychosis, paranoid [facility])  Dx    Therapist met with patient for a Comprehensive Clinical Assessment.  She was referred by [facility].  Patient was there for medication adjustments from 06/02/17 to 06/10/17.   Patient seemed confused about why she was here.  When first asked about why she had been hospitalized she said because she had gotten sick on an airplane.  Later she clarified that Dr [name] recommended she get assessed at the hospital. Therapist asked patient for permission to invite patient's mom to join them.  Patient gave consent.   Mom clarified reason for hospitalization.  She reported that she has seen a significant improvement  with patient since her medications were adjusted. Neither patient nor her mother identified any concerns they felt needed to be addressed in therapy.   Therapist stressed the importance of taking the medications consistently.  Patient admitted she doesn't like taking them.  Concluded that therapy is not needed at this time.  Advised patient to continue medication management.    In speaking with her today, Valerie [name] continues to display delusions denial about her illness/ need to take medication and loose associations while denying hallucinations. Mom says she also sees this . She has to repeat the same things to [facility] especially about taking medications and not driving .The change to 50 mg Trazodone tablet was successful but Valerie [name] still returns to the desire to take no medication and be functional    Past Psychiatric History OUTPATIENT 03/20/2015 [place] Patient and father noted that 1st episode began in November with a few days of irritability, anxiety, depression, and anger. This resolved.  She has had two more severe episodes since then between February and now where she is irritable, short tempered, appears to be responding to internal stimulation, reporting auditory hallucinations, cursing at family. Her grades have dropped from As and Bs to possibly needing Summer school. Elements:  Location:  psychosis. Quality:  acute. Severity:  severe. Timing:  on going x 6 months. Duration:  6 months. Context:  auditory command hallucinations, irritability, mood changes with paranoia.. Associated Signs/Symptoms: Depression Symptoms:  depressed mood, anhedonia, insomnia, psychomotor agitation, fatigue,  difficulty concentrating, hopelessness, anxiety, panic attacks, loss of energy/fatigue, disturbed sleep, (Hypo) Manic Symptoms:  Distractibility, Hallucinations, Anxiety Symptoms:  Excessive Worry, Panic Symptoms, Psychotic Symptoms:  Hallucinations:  Auditory Command:  to hurt herself, never others, has never acted on them PTSD Symptoms: Had a traumatic exposure:  her brother died suddenly from an accident when she was 13 Followed from 03/20/2015 to 02/24/2017 when she appeared in complete remission  INPATIENT Date of Admission:  06/02/2017 Date of Discharge: 06/10/2017 Discharge Diagnoses:    Diagnosis Date Noted  . Psychosis [F29] 06/03/2017  . MDD (major depressive disorder), recurrent episode, severe ([place]) [F33.2] 06/02/2017   Past Medical History:  Past Medical History:  Diagnosis Date  . Anxiety   . Depression   . Fatigue   . Headache   . Psychosis 06/03/2017   No past surgical history on file.  Family History: Medical History Relation Name Comments  Mental illness Maternal Uncle     Relation Name Status Comments  Father  Alive   Maternal Uncle     Mother  Alive    Social History:  Social History   Social History  . Marital status: Single    Spouse name: N/A  . Number of children: N/A  . Years of education: HS sophomore  behind 2 grades, she entered school late and then failed a grade(   Social History Main Topics  . Smoking status: Never Smoker  . Smokeless tobacco: Never Used  . Alcohol use No  . Drug use: No  . Sexual activity: No   Other Topics Concern  . Developmental History: Prenatal History:   Birth History:   Postnatal Infancy:   Developmental History:   Milestones:  Sit-Up:    Crawl:   Walk:   Speech: School History: patie   Social History Narrative  . None   Additional History:   Is in the 10th grade at [employer]    nt is behind 2 grades, she entered school late and then failed a grade  Current Medications: Current Outpatient Prescriptions  Medication Sig Dispense Refill  . escitalopram (LEXAPRO) 20 MG tablet Take 1 tablet (20 mg total) by mouth daily. 30 tablet 1  . pantoprazole (PROTONIX) 40 MG tablet Take 1 tablet (40 mg total) by mouth daily. 30  tablet 0  . SPRINTEC 28 0.25-35 MG-MCG tablet Take 1 tablet by mouth daily.     . traZODone (DESYREL) 50 MG tablet Take 1 tablet (50 mg total) by mouth at bedtime. 30 tablet 1  . ziprasidone (GEODON) 40 MG capsule Take 1 capsule (40 mg total) by mouth 2 (two) times daily with a meal. 60 capsule 1  . simethicone (GAS-X) 80 MG chewable tablet Chew 1 tablet (80 mg total) by mouth every 6 (six) hours as needed for flatulence. 30 tablet 0   No current facility-administered medications for this visit.     Allergies:  Allergies  Allergen Reactions  . Sulfa Antibiotics Hives and Itching    Hives on face    Metabolic Disorder Labs: Lab Results  Component Value Date   HGBA1C 5.3 06/03/2017   MPG 105.41 06/03/2017   Lab Results  Component Value Date   PROLACTIN 79.1 (H) 06/04/2017   Lab Results  Component Value Date   CHOL 172 (H) 06/04/2017   TRIG 144 06/04/2017   HDL 69 06/04/2017   CHOLHDL 2.5 06/04/2017   VLDL 29 06/04/2017   LDLCALC 74 06/04/2017   Lab Results  Component Value Date  TSH 2.863 06/03/2017    Therapeutic Level Labs: No results found for: LITHIUM No results found for: VALPROATE No components found for:  CBMZ  Current Medications: Current Outpatient Prescriptions  Medication Sig Dispense Refill  . escitalopram (LEXAPRO) 20 MG tablet Take 1 tablet (20 mg total) by mouth daily. 30 tablet 1  . pantoprazole (PROTONIX) 40 MG tablet Take 1 tablet (40 mg total) by mouth daily. 30 tablet 0  . SPRINTEC 28 0.25-35 MG-MCG tablet Take 1 tablet by mouth daily.     . traZODone (DESYREL) 50 MG tablet Take 1 tablet (50 mg total) by mouth at bedtime. 30 tablet 1  . ziprasidone (GEODON) 40 MG capsule Take 1 capsule (40 mg total) by mouth 2 (two) times daily with a meal. 60 capsule 1  . simethicone (GAS-X) 80 MG chewable tablet Chew 1 tablet (80 mg total) by mouth every 6 (six) hours as needed for flatulence. 30 tablet 0   No current facility-administered medications for  this visit.    Musculoskeletal: Strength & Muscle Tone: within normal limits Gait & Station: normal Patient leans: N/A  Psychiatric Specialty Exam: Review of Systems  Constitutional: Negative for chills, diaphoresis, fever, malaise/fatigue and weight loss.  HENT: Negative for congestion, ear discharge, ear pain, hearing loss, nosebleeds, sinus pain, sore throat and tinnitus.   Eyes: Negative.   Respiratory: Negative.  Negative for stridor.   Cardiovascular: Negative.   Gastrointestinal: Positive for heartburn (Gas). Negative for abdominal pain, blood in stool, constipation, diarrhea, melena, nausea and vomiting.  Genitourinary: Positive for frequency (Psychogenic). Negative for dysuria, flank pain, hematuria and urgency.  Musculoskeletal: Negative for back pain, falls, joint pain, myalgias and neck pain.  Skin: Negative for itching and rash.  Neurological: Negative for dizziness, tingling, tremors, sensory change, speech change, focal weakness, seizures, loss of consciousness, weakness and headaches.  Endo/Heme/Allergies: Negative for environmental allergies and polydipsia. Does not bruise/bleed easily.  Psychiatric/Behavioral: Positive for hallucinations (Mom says she still hears voices).    Blood pressure 116/68, pulse 99, resp. rate 16, height 5' 0.5" (1.537 m), weight 152 lb (68.9 kg), SpO2 98 %.Body mass index is 29.2 kg/m.  General Appearance: Well Groomed  Eye Contact:  Fair  Speech:  Clear and Coherent  Volume:  Normal  Mood:  Dysphoric and Distracted at times  Affect:  Restricted  Thought Process:  Coherent, Goal Directed-continues to want to stop medications- Mom reinforces by saying she needs to take meds to stay out of the [employer] definitely wants to do C/O not being trusted and being watched(paranoia)Wants to drive believing she can now Descriptions of Associations: Loose  Orientation:  Full (Time, Place, and Person)  Thought Content: Logical, Illogical and  Obsessions with driving  Suicidal Thoughts:  No  Homicidal Thoughts:  No  Memory:  Limited by impaired thinking  Judgement:  Impaired  Insight:  Lacking  Psychomotor Activity:  Slight Restlessness  Concentration:  Concentration: Fair and Attention Span: Fair  Recall:  Negative  Fund of Knowledge: Negative  Language: Good  Akathisia:  Negative  Handed:  Right  AIMS (if indicated): See Screening  Assets:  Desire for Improvement Financial Resources/Insurance [place] Support Transportation Vocational/Educational  ADL's:  Impaired  Cognition: Impaired,  Mild and Moderate  Sleep:  Rx Trazodone    Screenings: AIMS     Admission (Discharged) from 06/02/2017 in [facility] Office Visit from 09/30/2016 in [place] Total Score  0  0    AUDIT     Admission (Discharged) from 06/02/2017 in [place] CHILD/ADOLES 100B  Alcohol Use Disorder Identification Test Final Score (AUDIT)  0       Assessment Continues to be delusional and paranoid .   Plan:  Paranoid schizophrenia Continue discharge meds for now Gensight as pt is not at goal with Geodon and was quite mask like on previous treatment FU 2 weeks to review Gensight and current meds  Continue to Consider adding Anafranil HS for OCD/Mood symptoms .May be able to use for sleep as well?  Brief intervention/counseling-informed patient she has a chronic disease called schizophrenia and that like other chronic diseases pt needs to leran about her condition and how to manage it. Later spoke with Counselor Cordella Register about Patient Education for [place] to help her understand and mange her condition to the best of her ability.She agrees this form of counseling is appropriate and ahe will prepare to meet with [facility] when her medication is sorted out and her thinking improved.  [name], PA-C 06/30/2017, 5:47 PM

## 2017-07-06 ENCOUNTER — Telehealth (HOSPITAL_COMMUNITY): Payer: Self-pay | Admitting: Medical

## 2017-07-06 NOTE — Telephone Encounter (Signed)
Medication refill- pt's mother called requesting a refill for Geodon. Lvm informing pt's mother Geodon rx was escribed to [facility] on 06/16/17 w/ 1 refill. Refill request is denied. Next refill is due on 08/16/17. nothing further is need at this time.

## 2017-07-06 NOTE — Telephone Encounter (Signed)
Pt needs refill on geodon [facility] In [place].

## 2017-07-14 ENCOUNTER — Encounter (HOSPITAL_COMMUNITY): Payer: Self-pay | Admitting: Medical

## 2017-07-14 ENCOUNTER — Ambulatory Visit (INDEPENDENT_AMBULATORY_CARE_PROVIDER_SITE_OTHER): Payer: 59 | Admitting: Medical

## 2017-07-14 VITALS — BP 116/72 | HR 100 | Resp 16 | Ht 60.5 in | Wt 152.0 lb

## 2017-07-14 DIAGNOSIS — Z8659 Personal history of other mental and behavioral disorders: Secondary | ICD-10-CM

## 2017-07-14 DIAGNOSIS — F411 Generalized anxiety disorder: Secondary | ICD-10-CM

## 2017-07-14 DIAGNOSIS — F200 Paranoid schizophrenia: Secondary | ICD-10-CM

## 2017-07-14 MED ORDER — TRAZODONE HCL 50 MG PO TABS: ORAL_TABLET | ORAL | 2 refills | Status: DC

## 2017-07-14 MED ORDER — ARIPIPRAZOLE 10 MG PO TABS: 10.0000 mg | ORAL_TABLET | Freq: Every day | ORAL | 2 refills | Status: DC

## 2017-07-14 NOTE — Progress Notes (Signed)
Pt returned for FU on [facility] but unfortunately reports isnt back as expected due to late pickup. Mother reports pt is having visual hallucinations;trouble sleeping and mild agitation on Geodon Pt reqiures 100 mg of Trazodone but cant take the single dose tablet due to size. Rx sent for Abilify which she was on before and Trazodone refill 45m 2 HS. FU 2 weeks to review report .No charge today

## 2017-07-28 ENCOUNTER — Ambulatory Visit (INDEPENDENT_AMBULATORY_CARE_PROVIDER_SITE_OTHER): Payer: 59 | Admitting: Medical

## 2017-07-28 ENCOUNTER — Encounter (HOSPITAL_COMMUNITY): Payer: Self-pay | Admitting: Medical

## 2017-07-28 DIAGNOSIS — Z79899 Other long term (current) drug therapy: Secondary | ICD-10-CM | POA: Diagnosis not present

## 2017-07-28 DIAGNOSIS — F411 Generalized anxiety disorder: Secondary | ICD-10-CM

## 2017-07-28 DIAGNOSIS — F200 Paranoid schizophrenia: Secondary | ICD-10-CM | POA: Diagnosis not present

## 2017-07-28 DIAGNOSIS — Z8659 Personal history of other mental and behavioral disorders: Secondary | ICD-10-CM | POA: Diagnosis not present

## 2017-07-28 MED ORDER — CLOMIPRAMINE HCL 25 MG PO CAPS: 25.0000 mg | ORAL_CAPSULE | Freq: Every day | ORAL | 0 refills | Status: DC

## 2017-07-28 MED ORDER — TRAZODONE HCL 50 MG PO TABS: ORAL_TABLET | ORAL | 2 refills | Status: DC

## 2017-07-28 MED ORDER — ARIPIPRAZOLE 10 MG PO TABS: 10.0000 mg | ORAL_TABLET | Freq: Every day | ORAL | 2 refills | Status: DC

## 2017-07-28 NOTE — Progress Notes (Addendum)
[facility] MD/PA/NP OP Progress Note  07/28/2017 6:16 PM Valerie [name]  MRN:  [id number]  Subjective:" I dont feel like they trust me" Chief Complaint:  Chief Complaint    Follow-up; Medication Problem; Paranoid     Visit Diagnosis:     ICD-10-CM   1. Paranoid schizophrenia ([place]) F20.0   2. History of OCD (obsessive compulsive disorder) Z86.59   3. GAD (generalized anxiety disorder) F41.1   4. Medication management Z79.899   HPI: At prior visit: Pt returns with mother and unfortunately has had another psychotic break which began 3 weeks ago in [place] while visiting. Pt's mother did call at that time and was advised to take pt to Hospital but she hasnt been to date. Mom reports daughter has begun to have vegetative signs especially around hyd giene. She has OC signs around urge to urinatwe with negative exam for UTI/Diabetes.Pt reports hearing voices. Mom says she has been paranoid about Mom and she has refused to answer the phone when parents call to check on her.She does not sleep  At Last Visit:  Valerie [name] returns with her mother having been discharged from Hospital for FU and Medication management. Starts school next week  TODAY: Valerie [name] returns for FU /Med Management having gone back to school. Switch to Valerie [name] has been successful.Pt no longer displays akathesia and is no longer hallucinating. She met with Counselor :06/22/2017 [place], Leary Roca, LCSW  Licensed Clinical Social Worker   Psychosis, paranoid [facility])  Dx    Therapist met with patient for a Comprehensive Clinical Assessment.  She was referred by [facility].  Patient was there for medication adjustments from 06/02/17 to 06/10/17.   Patient seemed confused about why she was here.  When first asked about why she had been hospitalized she said because she had gotten sick on an airplane.  Later she clarified that Dr [name] recommended she get assessed at the  hospital. Therapist asked patient for permission to invite patient's mom to join them.  Patient gave consent.   Mom clarified reason for hospitalization.  She reported that she has seen a significant improvement with patient since her medications were adjusted. Neither patient nor her mother identified any concerns they felt needed to be addressed in therapy.   Therapist stressed the importance of taking the medications consistently.  Patient admitted she doesn't like taking them.  Concluded that therapy is not needed at this time.  Advised patient to continue medication management.    In speaking with her today, Valerie [name] continues to display delusions denial about her illness/ need to take medication and loose associations while denying hallucinations. Mom says she also sees this . She has to repeat the same things to [facility] especially about taking medications and not driving .The change to 50 mg Trazodone tablet was successful but Valerie [name] still returns to the desire to take no medication and be functional    Past Psychiatric History OUTPATIENT 03/20/2015 [place] Patient and father noted that 1st episode began in November with a few days of irritability, anxiety, depression, and anger. This resolved.  She has had two more severe episodes since then between February and now where she is irritable, short tempered, appears to be responding to internal stimulation, reporting auditory hallucinations, cursing at family. Her grades have dropped from As and Bs to possibly needing Summer school. Elements:  Location:  psychosis. Quality:  acute. Severity:  severe. Timing:  on  going x 6 months. Duration:  6 months. Context:  auditory command hallucinations, irritability, mood changes with paranoia.. Associated Signs/Symptoms: Depression Symptoms:  depressed mood, anhedonia, insomnia, psychomotor agitation, fatigue, difficulty  concentrating, hopelessness, anxiety, panic attacks, loss of energy/fatigue, disturbed sleep, (Hypo) Manic Symptoms:  Distractibility, Hallucinations, Anxiety Symptoms:  Excessive Worry, Panic Symptoms, Psychotic Symptoms:  Hallucinations: Auditory Command:  to hurt herself, never others, has never acted on them PTSD Symptoms: Had a traumatic exposure:  her brother died suddenly from an accident when she was 49 Followed from 03/20/2015 to 02/24/2017 when she appeared in complete remission  INPATIENT Date of Admission:  06/02/2017 Date of Discharge: 06/10/2017 Discharge Diagnoses:    Diagnosis Date Noted  . Psychosis [F29] 06/03/2017  . MDD (major depressive disorder), recurrent episode, severe ([place]) [F33.2] 06/02/2017   Past Medical History:  Past Medical History:  Diagnosis Date  . Anxiety   . Depression   . Fatigue   . Headache   . Psychosis ([place]) 06/03/2017   No past surgical history on file.  Family History: Medical History Relation Name Comments  Mental illness Maternal Uncle     Relation Name Status Comments  Father  Alive   Maternal Uncle     Mother  Alive    Social History:  Social History   Social History  . Marital status: Single    Spouse name: N/A  . Number of children: N/A  . Years of education: HS sophomore  behind 2 grades, she entered school late and then failed a grade(   Social History Main Topics  . Smoking status: Never Smoker  . Smokeless tobacco: Never Used  . Alcohol use No  . Drug use: No  . Sexual activity: No   Other Topics Concern  . Developmental History: Prenatal History:   Birth History:   Postnatal Infancy:   Developmental History:   Milestones:  Sit-Up:    Crawl:   Walk:   Speech: School History: patie   Social History Narrative  . None   Additional History:   Is in the 10th grade at [employer]    nt is behind 2 grades, she entered school late and then failed a grade  Current  Medications: Current Outpatient Prescriptions  Medication Sig Dispense Refill  . escitalopram (LEXAPRO) 20 MG tablet Take 1 tablet (20 mg total) by mouth daily. 30 tablet 1  . pantoprazole (PROTONIX) 40 MG tablet Take 1 tablet (40 mg total) by mouth daily. 30 tablet 0  . SPRINTEC 28 0.25-35 MG-MCG tablet Take 1 tablet by mouth daily.     . traZODone (DESYREL) 50 MG tablet Take 1 tablet (50 mg total) by mouth at bedtime. 30 tablet 1  . ziprasidone (GEODON) 40 MG capsule Take 1 capsule (40 mg total) by mouth 2 (two) times daily with a meal. 60 capsule 1  . simethicone (GAS-X) 80 MG chewable tablet Chew 1 tablet (80 mg total) by mouth every 6 (six) hours as needed for flatulence. 30 tablet 0   No current facility-administered medications for this visit.     Allergies:  Allergies  Allergen Reactions  . Sulfa Antibiotics Hives and Itching    Hives on face    Metabolic Disorder Labs: Lab Results  Component Value Date   HGBA1C 5.3 06/03/2017   MPG 105.41 06/03/2017   Lab Results  Component Value Date   PROLACTIN 79.1 (H) 06/04/2017   Lab Results  Component Value Date   CHOL 172 (H) 06/04/2017  TRIG 144 06/04/2017   HDL 69 06/04/2017   CHOLHDL 2.5 06/04/2017   VLDL 29 06/04/2017   LDLCALC 74 06/04/2017   Lab Results  Component Value Date   TSH 2.863 06/03/2017    Therapeutic Level Labs: No results found for: LITHIUM No results found for: VALPROATE No components found for:  CBMZ  Current Medications: Current Outpatient Prescriptions  Medication Sig Dispense Refill  . escitalopram (LEXAPRO) 20 MG tablet Take 1 tablet (20 mg total) by mouth daily. 30 tablet 1  . pantoprazole (PROTONIX) 40 MG tablet Take 1 tablet (40 mg total) by mouth daily. 30 tablet 0  . SPRINTEC 28 0.25-35 MG-MCG tablet Take 1 tablet by mouth daily.     . traZODone (DESYREL) 50 MG tablet Take 1 tablet (50 mg total) by mouth at bedtime. 30 tablet 1  . ziprasidone (GEODON) 40 MG capsule Take 1 capsule  (40 mg total) by mouth 2 (two) times daily with a meal. 60 capsule 1  . simethicone (GAS-X) 80 MG chewable tablet Chew 1 tablet (80 mg total) by mouth every 6 (six) hours as needed for flatulence. 30 tablet 0   No current facility-administered medications for this visit.    Musculoskeletal: Strength & Muscle Tone: within normal limits Gait & Station: normal Patient leans: N/A  Psychiatric Specialty Exam: Review of Systems  Constitutional: Negative for chills, diaphoresis, fever, malaise/fatigue and weight loss.  HENT: Negative for congestion, ear discharge, ear pain, hearing loss, nosebleeds, sinus pain, sore throat and tinnitus.   Eyes: Negative.   Respiratory: Negative.  Negative for stridor.   Cardiovascular: Negative.   Gastrointestinal: Positive for heartburn (Gas). Negative for abdominal pain, blood in stool, constipation, diarrhea, melena, nausea and vomiting.  Genitourinary: Negative for dysuria, flank pain, frequency (Psychogenic gone on Abilify), hematuria and urgency.  Musculoskeletal: Negative for back pain, falls, joint pain, myalgias and neck pain.  Skin: Negative for itching and rash.  Neurological: Negative for dizziness, tingling, tremors, sensory change, speech change, focal weakness, seizures, loss of consciousness, weakness and headaches.  Endo/Heme/Allergies: Negative for environmental allergies and polydipsia. Does not bruise/bleed easily.  Psychiatric/Behavioral: Positive for hallucinations (Resolved with Abilify).    There were no vitals taken for this visit.There is no height or weight on file to calculate BMI.  General Appearance: Well Groomed  Eye Contact:  Fair  Speech:  Clear and Coherent  Volume:  Normal  Mood:  Euthymic  Affect:  Restricted  Thought Process:  Coherent, Goal Directed-no longer wants to stop medications- Mom reports she is better-no longer paranoid Descriptions of Associations: Intact  Orientation:  Full (Time, Place, and Person)   Thought Content: WDL Logical  Suicidal Thoughts:  No  Homicidal Thoughts:  No  Memory:  Negative  Judgement:  Intact  Insight:  Present  Psychomotor Activity:  Normal  Concentration:  Concentration: Fair and Attention Span: Fair  Recall:  Negative  Fund of Knowledge: Negative  Language: Good  Akathisia:  Negative  Handed:  Right  AIMS (if indicated): Negative on Abilify  Assets:  Desire for Improvement Financial Resources/Insurance Housing Physical Health Social Support Transportation Vocational/Educational  ADL's:  Impaired  Cognition: Impaired,  Mild and Moderate  Sleep:  Rx Trazodone    Screenings: AIMS     Admission (Discharged) from 06/02/2017 in [place] Office Visit from 09/30/2016 in [place] Total Score  0  0    AUDIT     Admission (Discharged) from  06/02/2017 in [place] CHILD/ADOLES 100B  Alcohol Use Disorder Identification Test Final Score (AUDIT)  0     Gensight- Abilify efficacious at low dose                    Lexapro-genetic intereference with efficacy   Assessment No longer delusional and paranoid/Gensight helpful .   Plan: Abilify 10 mg daily           Stop Lexapro start Anafarnil low dose 25 mg HS            Continue Trazodone HS             FU 3 months-sooner if needed   Brief intervention/counseling-informed patient she has a chronic disease called schizophrenia and that like other chronic diseases pt needs to leran about her condition and how to manage it. Later spoke with Counselor Cordella Register about Patient Education for [place] to help her understand and mange her condition to the best of her ability.She agrees this form of counseling is appropriate and ahe will prepare to meet with [facility] when her medication is sorted out and her thinking improved.  [name], PA-C 07/28/2017, 6:16 PM

## 2017-10-23 ENCOUNTER — Other Ambulatory Visit (HOSPITAL_COMMUNITY): Payer: Self-pay | Admitting: Medical

## 2017-10-27 ENCOUNTER — Ambulatory Visit (INDEPENDENT_AMBULATORY_CARE_PROVIDER_SITE_OTHER): Payer: 59 | Admitting: Medical

## 2017-10-27 ENCOUNTER — Encounter (HOSPITAL_COMMUNITY): Payer: Self-pay | Admitting: Medical

## 2017-10-27 ENCOUNTER — Other Ambulatory Visit: Payer: Self-pay

## 2017-10-27 VITALS — BP 122/82 | HR 117 | Ht 60.5 in | Wt 167.8 lb

## 2017-10-27 DIAGNOSIS — F200 Paranoid schizophrenia: Secondary | ICD-10-CM | POA: Diagnosis not present

## 2017-10-27 DIAGNOSIS — F411 Generalized anxiety disorder: Secondary | ICD-10-CM

## 2017-10-27 DIAGNOSIS — Z8659 Personal history of other mental and behavioral disorders: Secondary | ICD-10-CM

## 2017-10-27 DIAGNOSIS — Z79899 Other long term (current) drug therapy: Secondary | ICD-10-CM

## 2017-10-27 MED ORDER — ARIPIPRAZOLE 10 MG PO TABS: 10.0000 mg | ORAL_TABLET | Freq: Every day | ORAL | 1 refills | Status: DC

## 2017-10-27 MED ORDER — CLOMIPRAMINE HCL 25 MG PO CAPS: 25.0000 mg | ORAL_CAPSULE | Freq: Every day | ORAL | 1 refills | Status: DC

## 2017-10-27 NOTE — Progress Notes (Signed)
BH MD/PA/NP OP Progress Note  10/27/2017 4:54 PM Valerie [name]  MRN:  [id number]  Chief Complaint:  Chief Complaint    Follow-up; Schizophrenia; OCD; Anxiety; Medication Refill    Visit Diagnosis   ICD-10-CM   1. Paranoid schizophrenia ([place]) F20.0   2. History of OCD (obsessive compulsive disorder) Z86.59   3. GAD (generalized anxiety disorder) F41.1   4. Medication management Z79.899    HPI: At prior visit 06/02/2017: Pt returns with mother and unfortunately has had another psychotic break which began 3 weeks ago in [place] while visiting. Pt's mother did call at that time and was advised to take pt to Hospital but she hasnt been to date. Mom reports daughter has begun to have vegetative signs especially around hyd giene. She has OC signs around urge to urinatwe with negative exam for UTI/Diabetes.Pt reports hearing voices. Mom says she has been paranoid about Mom and she has refused to answer the phone when parents call to check on her.She does not sleep  At prior Visit 06/16/2017 :  Valerie [name] returns with her mother having been discharged from Hospital for FU and Medication management. Starts school next week Assessment Continues to hallucinate and obsess.Dislikes large Trazodone tablet but doesnt sleep thru nite if she doesnt take something Plan: Continues discharge meds except Trazodone 100 mg-try 50 mg dose  FU 2 weeks  Consider adding Anafranil HS for OCD symptoms .May be able to use for sleep as well?  At prior Visit 9/6//2018:  Valerie [name] returns for FU /Med Management having gone back to school.  She met with Counselor :06/22/2017 Valerie [name], Leary Roca, LCSW  Licensed Clinical Social Worker   Psychosis, paranoid [facility])  Dx   Therapist met with patient for a Comprehensive Clinical Assessment. She was referred by [facility]. Patient was there for medication adjustments from 06/02/17 to 06/10/17.  Patient seemed confused about  why she was here. When first asked about why she had been hospitalized she said because she had gotten sick on an airplane. Later she clarified that Dr [name] recommended she get assessed at the hospital. Therapist asked patient for permission to invite patient's mom to join them. Patient gave consent.  Mom clarified reason for hospitalization. She reported that she has seen a significant improvement with patient since her medications were adjusted. Neither patient nor her mother identified any concerns they felt needed to be addressed in therapy.  Therapist stressed the importance of taking the medications consistently. Patient admitted she doesn't like taking them. Concluded that therapy is not needed at this time. Advised patient to continue medication management.  Plan:  Paranoid schizophrenia Continue discharge meds for now Gensight as pt is not at goal with Geodon and was quite mask like on previous treatment FU 2 weeks to review Gensight and current meds Continue to Consider adding Anafranil HS for OCD/Mood symptoms .May be able to use for sleep as well?  At prior visit 07/14/2017: Author: Dara Hoyer, PA-C Author Type: Physician Assistant Certified Filed: 4/78/2956 4:29 PM  Note Status: Signed Cosign: Cosign Not Required Encounter Date: 07/14/2017  Editor: Sherlynn Stalls (Physician Assistant Certified)  Important Sensitive Note    Pt returned for FU on [place] but unfortunately reports isnt back as expected due to late pickup. Mother reports pt is having visual hallucinations;trouble sleeping and mild agitation on Geodon Pt reqiures 100 mg of Trazodone but cant take the single dose tablet due to size. Rx sent  for Abilify which she was on before and Trazodone refill 83m 2 HS. FU 2 weeks to review report .No charge today     At last visit 07/28/2017: FKeaunareturns for FU /Med Management having gone back to schoolGensight- Abilify efficacious at low dose                     Lexapro-genetic intereference with efficac.   Plan: Abilify 10 mg daily           Stop Lexapro start Anafarnil low dose 25 mg HS            Continue Trazodone HS             FU 3 months-sooner if needed  TODAY 10/27/2017 FMelviereturns with her mother and remains free of EPS on low dose Abilify as well as free of hallucinations. She has become more accepting of need to stay on meds to stay out of the Hospital.She no longer takes Trazodone to sleep.She is doing well in school-this is her senior year.She says she does not have a boyfriend.  These records have been reviewed by me for currency and carried forward . They are not necessary for this visit but are of interest to this clinician at all visits for awareness of the whole patient.  Past Psychiatric History:  Past Psychiatric History [facility] that 1st episode began in November with a few days of irritability, anxiety, depression, and anger. This resolved. She has had two more severe episodes since then between February and now where she is irritable, short tempered, appears to be responding to internal stimulation, reporting auditory hallucinations, cursing at family. Her grades have dropped from As and Bs to possibly needing Summer school. Elements: Location: psychosis. Quality: acute. Severity: severe. Timing: on going x 6 months. Duration: 6 months. Context: auditory command hallucinations, irritability, mood changes with paranoia.. Associated Signs/Symptoms: Depression Symptoms: depressed mood, anhedonia, insomnia, psychomotor agitation, fatigue, difficulty concentrating, hopelessness, anxiety, panic attacks, loss of energy/fatigue, disturbed sleep, (Hypo) Manic Symptoms: Distractibility, Hallucinations, Anxiety Symptoms: Excessive Worry, Panic Symptoms, Psychotic Symptoms:Hallucinations:  Auditory Command: to hurt herself, never others, has never acted on them PTSD Symptoms: Had a traumatic exposure: her brother died suddenly from an accident when she was 89Followed from 03/20/2015 to 02/24/2017 when she appeared in complete remission  INPATIENT Date of Admission: 06/02/2017 Date of Discharge:06/10/2017 Discharge Diagnoses:        Diagnosis Date Noted  . Psychosis [F29] 06/03/2017  . MDD (major depressive disorder), recurrent episode, severe ([place] [[place].[facility]     Past Medical History:  Past Medical History:  Diagnosis Date  . Anxiety   . Depression   . Fatigue   . Headache   . Psychosis ([place] 06/03/2017   History reviewed. No pertinent surgical history.  Family Psychiatric History: Unspecified Mental Illness in Maternal Uncle  Family History: History reviewed. No pertinent family history.  Social History:  Social History   Socioeconomic History  . Marital status: Single    Spouse name: None  . Number of children: None  . Years of education: HS Senior  . Highest education level:   Social Needs  . Financial resource strain: None  . Food insecurity - worry: None  . Food insecurity - inability: None  . Transportation needs - medical: None  . Transportation needs - non-medical: None  Occupational History  . None  Tobacco Use  . Smoking status: Never  Smoker  . Smokeless tobacco: Never Used  Substance and Sexual Activity  . Alcohol use: No  . Drug use: No  . Sexual activity: Not Currently    Birth control/protection: Pill, Abstinence  Other Topics Concern  . Marland Kitchen Developmental History: Prenatal History:  Birth History:  Postnatal Infancy:  Developmental History:  Milestones:  Sit-Up:   Crawl:  Walk:  Speech:     Social History Narrative  . None    Allergies:  Allergies  Allergen Reactions  . Sulfa Antibiotics Hives and Itching    Hives on face    Metabolic Disorder Labs: Lab Results  Component Value Date   HGBA1C 5.3  06/03/2017   MPG 105.41 06/03/2017   Lab Results  Component Value Date   PROLACTIN 79.1 (H) 06/04/2017   Lab Results  Component Value Date   CHOL 172 (H) 06/04/2017   TRIG 144 06/04/2017   HDL 69 06/04/2017   CHOLHDL 2.5 06/04/2017   VLDL 29 06/04/2017   LDLCALC 74 06/04/2017   Lab Results  Component Value Date   TSH 2.863 06/03/2017    Therapeutic Level Labs: No results found for: LITHIUM No results found for: VALPROATE No components found for:  CBMZ  Current Medications: Current Outpatient Medications  Medication Sig Dispense Refill  . ARIPiprazole (ABILIFY) 10 MG tablet Take 1 tablet (10 mg total) by mouth daily. 90 tablet 1  . clomiPRAMINE (ANAFRANIL) 25 MG capsule Take 1 capsule (25 mg total) by mouth at bedtime. 90 capsule 1  . ketoconazole (NIZORAL) 2 % cream     . pantoprazole (PROTONIX) 40 MG tablet Take 1 tablet (40 mg total) by mouth daily. 30 tablet 0  . simethicone (GAS-X) 80 MG chewable tablet Chew 1 tablet (80 mg total) by mouth every 6 (six) hours as needed for flatulence. 30 tablet 0  . SPRINTEC 28 0.25-35 MG-MCG tablet Take 1 tablet by mouth daily.      No current facility-administered medications for this visit.      Musculoskeletal: Strength & Muscle Tone: within normal limits Gait & Station: normal Patient leans: N/A  Psychiatric Specialty Exam: Review of Systems  Constitutional: Negative for chills, diaphoresis, fever, malaise/fatigue and weight loss.  Musculoskeletal: Negative for back pain, falls, joint pain, myalgias and neck pain.  Neurological: Negative for dizziness, tingling, tremors, sensory change, speech change, focal weakness, seizures, loss of consciousness, weakness and headaches.  Psychiatric/Behavioral: Positive for hallucinations (IN REMISSION ON MEDICATION). Negative for depression, memory loss, substance abuse and suicidal ideas. The patient is nervous/anxious. The patient does not have insomnia (sTOPPED TRAZODONE).     Blood  pressure 122/82, pulse (!) 117, height 5' 0.5" (1.537 m), weight 167 lb 12.8 oz (76.1 kg).Body mass index is 32.23 kg/m.  General Appearance: Casual and Neat  Eye Contact:  Good  Speech:  Clear and Coherent  Volume:  Normal  Mood:  Euthymic and SHY  Affect:  Appropriate  Thought Process:  Coherent and Descriptions of Associations: Intact  Orientation:  Full (Time, Place, and Person)  Thought Content: WDL   Suicidal Thoughts:  No  Homicidal Thoughts:  No  Memory:  Intact  Judgement:  Other:  Improved  Insight:  Seems more accepting of need for medication  Psychomotor Activity:  Normal  Concentration:  Concentration: Good and Attention Span: Good  Recall:  Good  Fund of Knowledge: Fair  Language: Spanish primary  Akathisia:  Negative  Handed:  Right  AIMS (if indicated): Negative  Assets:  Desire for Improvement  Financial Resources/Insurance Housing Physical Health Resilience Social Support Transportation Vocational/Educational  ADL's:  Intact  Cognition: WNL  Sleep:  Good   Screenings: AIMS     Admission (Discharged) from 06/02/2017 in [place] Office Visit from 09/30/2016 in [place] Total Score  0  0    AUDIT     Admission (Discharged) from 06/02/2017 in [place] CHILD/ADOLES 100B  Alcohol Use Disorder Identification Test Final Score (AUDIT)  0       Assessment ; Schizophrenia controlled with low dose Abilify -no Akathesia with this medication                          OCD controlled with Anafranil-sleeping well with this medication-no longer uses Trazodone   Plan: Continue current meds per assessment FU 3 months Repeat Labs from [facility], PA-C 10/27/2017, 4:54 PM

## 2018-01-19 ENCOUNTER — Ambulatory Visit (INDEPENDENT_AMBULATORY_CARE_PROVIDER_SITE_OTHER): Payer: 59 | Admitting: Medical

## 2018-01-19 ENCOUNTER — Other Ambulatory Visit: Payer: Self-pay

## 2018-01-19 ENCOUNTER — Encounter (HOSPITAL_COMMUNITY): Payer: Self-pay | Admitting: Medical

## 2018-01-19 VITALS — BP 120/82 | HR 119 | Ht 60.05 in | Wt 179.0 lb

## 2018-01-19 DIAGNOSIS — Z8659 Personal history of other mental and behavioral disorders: Secondary | ICD-10-CM | POA: Diagnosis not present

## 2018-01-19 DIAGNOSIS — Z79899 Other long term (current) drug therapy: Secondary | ICD-10-CM | POA: Diagnosis not present

## 2018-01-19 DIAGNOSIS — F200 Paranoid schizophrenia: Secondary | ICD-10-CM

## 2018-01-19 DIAGNOSIS — F411 Generalized anxiety disorder: Secondary | ICD-10-CM | POA: Diagnosis not present

## 2018-01-19 MED ORDER — CLOMIPRAMINE HCL 25 MG PO CAPS: 25.0000 mg | ORAL_CAPSULE | Freq: Every day | ORAL | 1 refills | Status: DC

## 2018-01-19 NOTE — Progress Notes (Signed)
[place] MD/PA/NP OP Progress Note  01/19/2018 4:38 PM Valerie [name]  MRN:  [id number]  Chief Complaint:  Chief Complaint    Follow-up; Paranoid; OCD     HPI:  At prior visit 07/28/2017: Valerie [name] returns for FU /Med Management  And lGensight- Abilify efficacious at low dose Lexapro-genetic intereference with efficacy..Has stopped Trazodone.Continues to do well with Mom's help Plan:Abilify 10 mg daily Stop Lexapro start Anafarnil low dose 25 mg HS Continue Trazodone HS FU 3 months-sooner if needed  At Last visit 10/27/2017: Valerie [name] returns with her mother and remains free of EPS on low dose Abilify as well as free of hallucinations. She has become more accepting of need to stay on meds to stay out of the Hospital.She no longer takes Trazodone to sleep.She is doing well in school-this is her senior year.She says she does not have a boyfriend  Today 01/19/2018 Valerie [name] returns once again with her Mother. She reports no problems with medication except wgt gain. Keeps returning to theme of not wanting to take meds but when reminded she will end up in [facility] again she quickly reverses her stance and is willing to take.She has had no further Psychotic episodes. She says she is doing well in school. Mom tries to get her to exercise but [facility] [place] [facility] doesnt care much for exercise either. Says she is doing well in school and is looking forward  to Graduating In June Pt and Mom do not wish to make any changes today to treatment plan   Visit Diagnosis:    ICD-10-CM   1. Paranoid schizophrenia ([place]) F20.0   2. History of OCD (obsessive compulsive disorder) Z86.59   3. GAD (generalized anxiety disorder) F41.1   4. Medication management Z79.899    Allergies:  Allergies  Allergen Reactions  . Sulfa Antibiotics Hives and Itching    Hives on face    Metabolic Disorder Labs: Lab Results  Component Value Date   HGBA1C 5.3 06/03/2017   MPG 105.41 06/03/2017   Lab Results  Component  Value Date   PROLACTIN 79.1 (H) 06/04/2017   Lab Results  Component Value Date   CHOL 172 (H) 06/04/2017   TRIG 144 06/04/2017   HDL 69 06/04/2017   CHOLHDL 2.5 06/04/2017   VLDL 29 06/04/2017   LDLCALC 74 06/04/2017   Lab Results  Component Value Date   TSH 2.863 06/03/2017    Current Medications: Current Outpatient Medications  Medication Sig Dispense Refill  . ARIPiprazole (ABILIFY) 10 MG tablet Take 1 tablet (10 mg total) by mouth daily. 90 tablet 1  . clomiPRAMINE (ANAFRANIL) 25 MG capsule Take 1 capsule (25 mg total) by mouth at bedtime. 90 capsule 1  . ketoconazole (NIZORAL) 2 % cream     . pantoprazole (PROTONIX) 40 MG tablet Take 1 tablet (40 mg total) by mouth daily. (Patient not taking: Reported on 01/19/2018) 30 tablet 0  . simethicone (GAS-X) 80 MG chewable tablet Chew 1 tablet (80 mg total) by mouth every 6 (six) hours as needed for flatulence. (Patient not taking: Reported on 01/19/2018) 30 tablet 0   No current facility-administered medications for this visit.      Musculoskeletal: Strength & Muscle Tone: within normal limits Gait & Station: normal Patient leans: N/A  Psychiatric Specialty Exam: Review of Systems  Constitutional: Negative for chills, diaphoresis, fever, malaise/fatigue and weight loss.  Eyes: Negative for blurred vision, double vision, photophobia, pain, discharge and redness.  Genitourinary: Negative for dysuria, frequency, hematuria and urgency.  Musculoskeletal: Negative.  Negative  for back pain, falls, joint pain, myalgias and neck pain.  Neurological: Negative for dizziness, tingling, tremors, sensory change, speech change, focal weakness, seizures, weakness and headaches. Loss of consciousness: AIMS negative.  Endo/Heme/Allergies: Does not bruise/bleed easily.  Psychiatric/Behavioral: Positive for hallucinations (in remission). Negative for depression, memory loss, substance abuse and suicidal ideas. The patient is not nervous/anxious  and does not have insomnia.     Blood pressure 120/82, pulse (!) 119, height 5' 0.05" (1.525 m), weight 179 lb (81.2 kg).Body mass index is 34.9 kg/m.  General Appearance: Casual  Eye Contact:  Good  Speech:  Clear and Coherent  Volume:  Normal  Mood:  Euthymic  Affect:  Congruent  Thought Process:  Coherent and Descriptions of Associations: Intact  Orientation:  Full (Time, Place, and Person)  Thought Content: Logical   Suicidal Thoughts:  No  Homicidal Thoughts:  No  Memory:  Negative  Judgement:  Intact  Insight:  Fair  Psychomotor Activity:  Normal  Concentration:  Concentration: Good and Attention Span: Good  Recall:  Good  Fund of Knowledge: Fair  Language: Fair  Akathisia:  NA  Handed:  Right  AIMS (if indicated): done  Assets:  Desire for Improvement Housing Intimacy Leisure Time Resilience Social Support Transportation Vocational/Educational  ADL's:  Intact  Cognition: WNL  Sleep:  Good   Screenings: AIMS     Office Visit from 10/27/2017 in [facility] Admission (Discharged) from 06/02/2017 in [facility] Office Visit from 09/30/2016 in [facility] Total Score  0  0  0    AUDIT     Admission (Discharged) from 06/02/2017 in [facility] CHILD/ADOLES 100B  Alcohol Use Disorder Identification Test Final Score (AUDIT)  0       Assessment and Plan:  Schizophrenia stable/in remission OCD controlled  Continue current plan  FU 3 months-sooner if needed  [name], PA-C 01/19/2018, 4:38 PM

## 2018-01-25 IMAGING — CT CT HEAD W/O CM
3 of 4 series · 14 of 47 positions shown, 16 images · non-contrast
Comparison: None.

CLINICAL DATA: Initial evaluation for depression and psychosis.

EXAM:
CT HEAD WITHOUT CONTRAST
TECHNIQUE: Contiguous axial images were obtained from the base of the skull
through the vertex without intravenous contrast.

[Series 2: head w/o · axial · non-contrast · 0.45mm/px · z∈[-261,-141]mm · 8 of 30 slices shown, 10 images]
[im 3/30  brain]
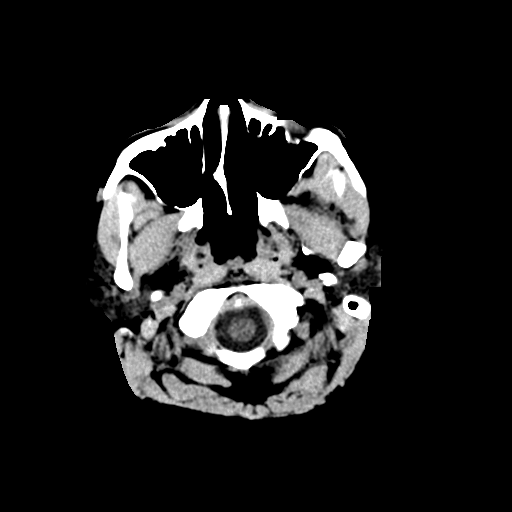
[im 3/30  bone]
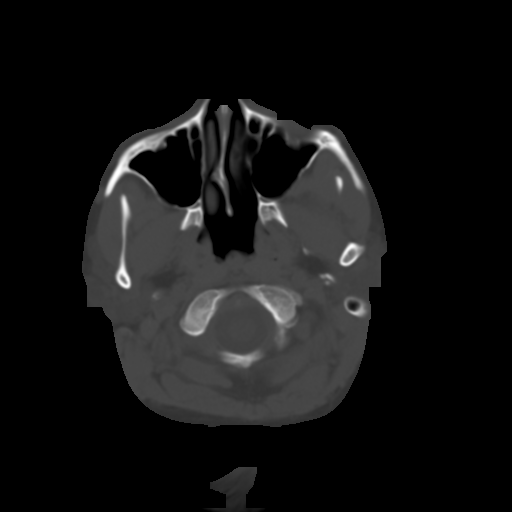
[im 7/30  brain]
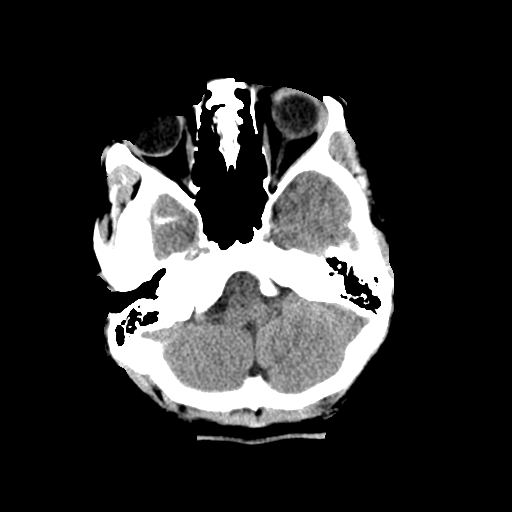
[im 11/30  brain]
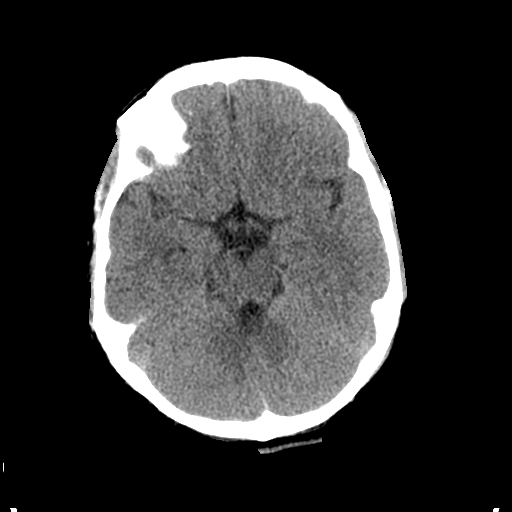
[im 13/30  brain]
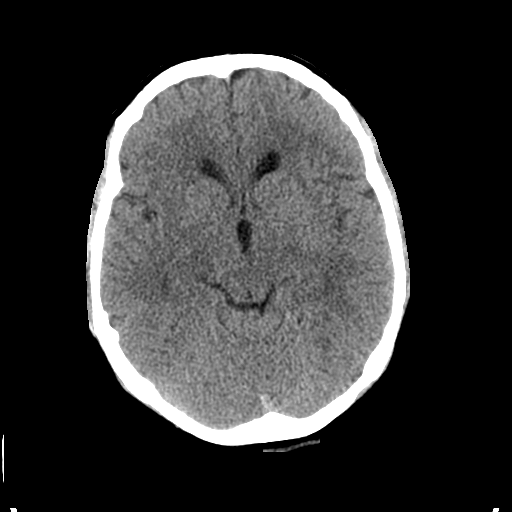
[im 17/30  brain]
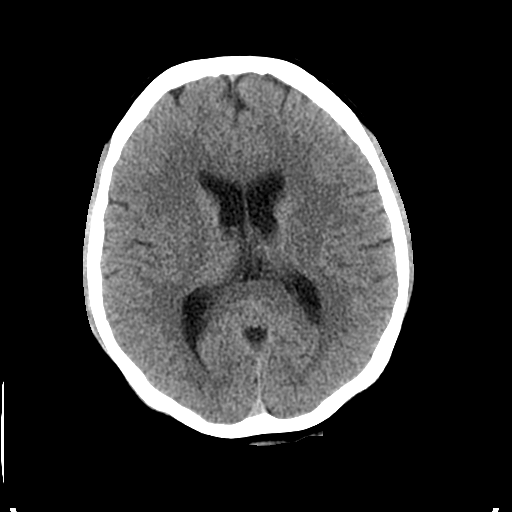
[im 17/30  bone]
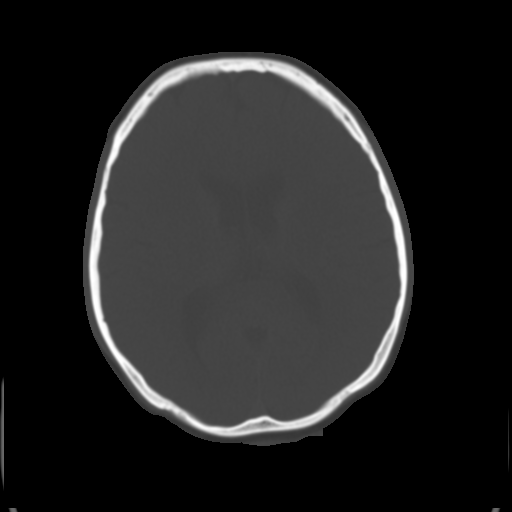
[im 19/30  brain]
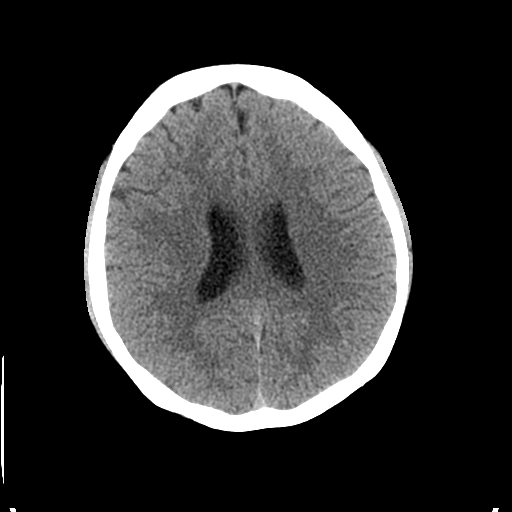
[im 23/30  brain]
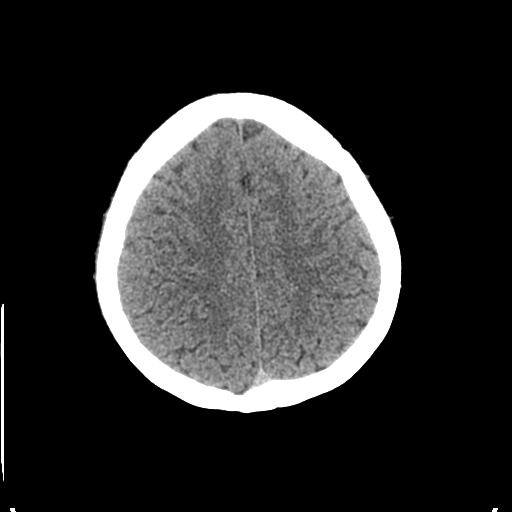
[im 27/30  brain]
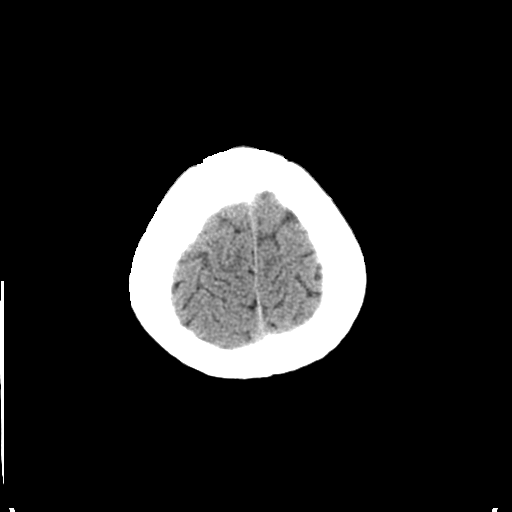

[Series 4: coronal · coronal · 0.29mm/px · 3 of 73 slices shown]
[im 25/73  brain]
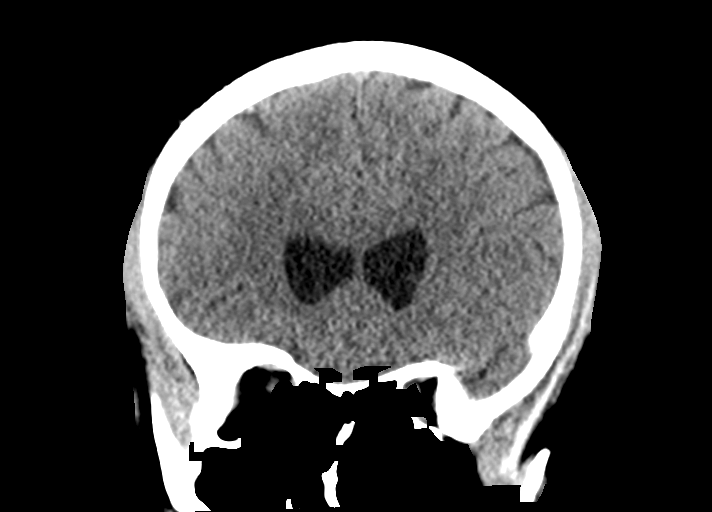
[im 33/73  brain]
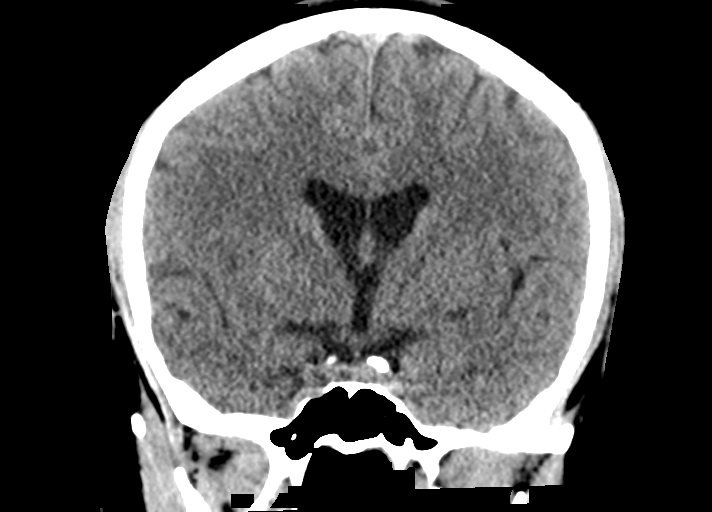
[im 41/73  brain]
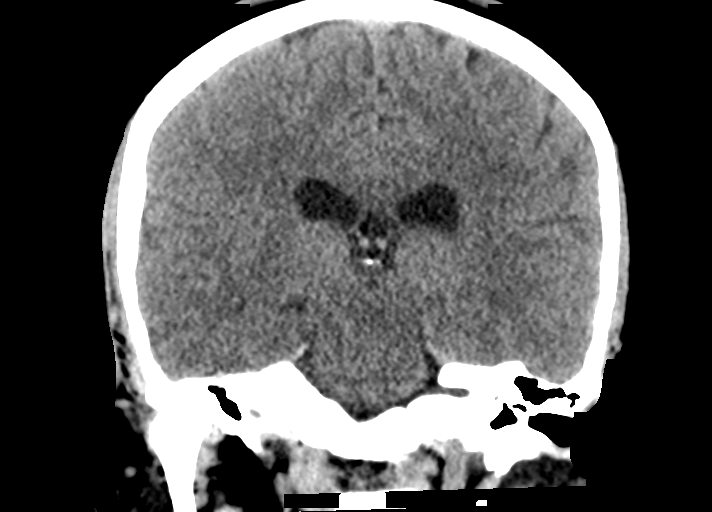

[Series 5: sagittal · sagittal · 0.29mm/px · 3 of 65 slices shown]
[im 22/65  brain]
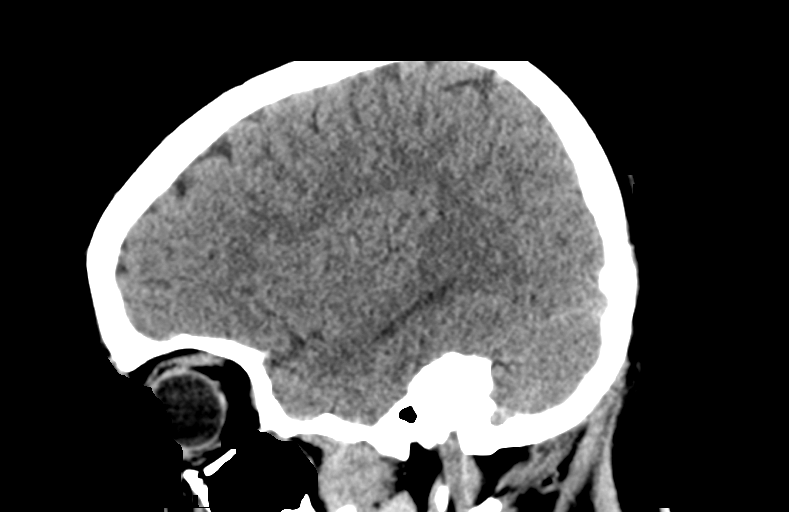
[im 33/65  brain]
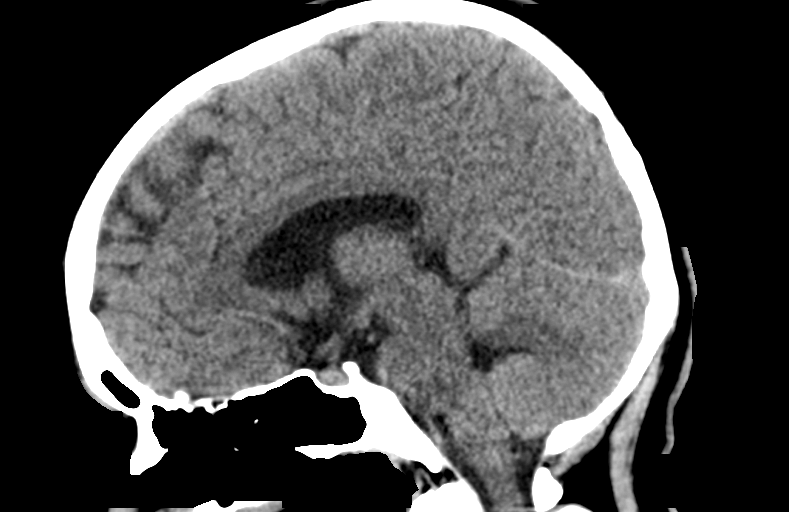
[im 43/65  brain]
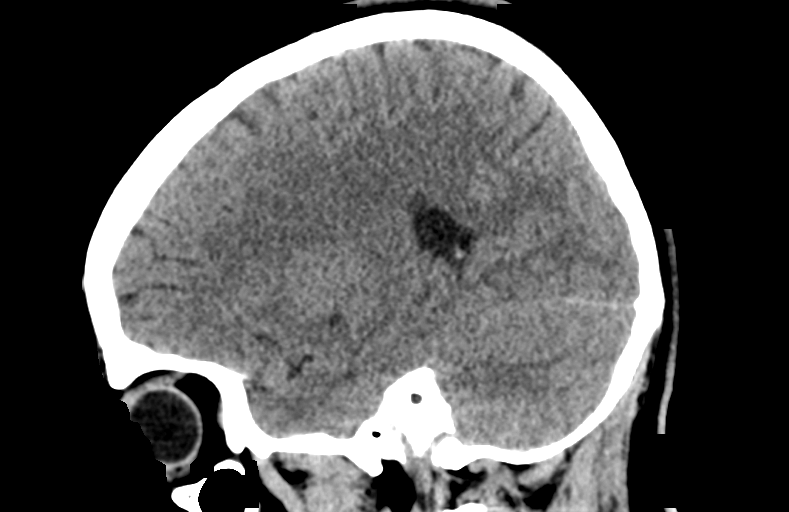

[14 of 47 positions shown; findings below may reference images not displayed]

FINDINGS: Brain: Cerebral volume within normal limits for patient age.

No evidence for acute intracranial hemorrhage. No findings to
suggest acute large vessel territory infarct. No mass lesion,
midline shift, or mass effect. Ventricles are normal in size without
evidence for hydrocephalus. No extra-axial fluid collection
identified.

Vascular: No hyperdense vessel identified.

Skull: Scalp soft tissues demonstrate no acute abnormality.Calvarium
intact.

Sinuses/Orbits: Globes and orbital soft tissues are within normal
limits.

Minimal scattered mucosal thickening within the paranasal sinuses.
Paranasal sinuses are otherwise clear. No mastoid effusion.
IMPRESSION: Normal head CT.  No acute intracranial process identified.

## 2018-04-13 ENCOUNTER — Encounter (HOSPITAL_COMMUNITY): Payer: Self-pay | Admitting: Medical

## 2018-04-13 ENCOUNTER — Other Ambulatory Visit: Payer: Self-pay

## 2018-04-13 ENCOUNTER — Ambulatory Visit (INDEPENDENT_AMBULATORY_CARE_PROVIDER_SITE_OTHER): Payer: 59 | Admitting: Medical

## 2018-04-13 ENCOUNTER — Other Ambulatory Visit (HOSPITAL_COMMUNITY): Payer: Self-pay | Admitting: Medical

## 2018-04-13 VITALS — BP 110/72 | HR 126 | Ht 60.05 in | Wt 182.0 lb

## 2018-04-13 DIAGNOSIS — F429 Obsessive-compulsive disorder, unspecified: Secondary | ICD-10-CM

## 2018-04-13 DIAGNOSIS — E669 Obesity, unspecified: Secondary | ICD-10-CM

## 2018-04-13 DIAGNOSIS — Z79899 Other long term (current) drug therapy: Secondary | ICD-10-CM

## 2018-04-13 DIAGNOSIS — Z6835 Body mass index (BMI) 35.0-35.9, adult: Secondary | ICD-10-CM | POA: Diagnosis not present

## 2018-04-13 DIAGNOSIS — F200 Paranoid schizophrenia: Secondary | ICD-10-CM

## 2018-04-13 MED ORDER — ARIPIPRAZOLE 10 MG PO TABS: 10.0000 mg | ORAL_TABLET | Freq: Every day | ORAL | 1 refills | Status: DC

## 2018-04-13 MED ORDER — CLOMIPRAMINE HCL 25 MG PO CAPS: 25.0000 mg | ORAL_CAPSULE | Freq: Every day | ORAL | 1 refills | Status: DC

## 2018-04-13 NOTE — Progress Notes (Signed)
BH MD/PA/NP OP Progress Note  04/13/2018 11:13 AM Valerie [name]  MRN:  [id number]  Chief Complaint:  Chief Complaint    Follow-up; Paranoid; OCD; Medication Refill     HPI:  At prior visit 07/28/2017: HPI Valerie [name] returns for FU /Med Management  And lGensight- Abilify efficacious at low dose Lexapro-genetic intereference with efficacy..Has stopped Trazodone.Continues to do well with Mom's help Plan:Abilify 10 mg daily Stop Lexapro start Anafarnil low dose 25 mg HS Continue Trazodone HS FU 3 months-sooner if needed  At Prior visit1/12/2017: HPI Valerie [name] returns with her mother and remains free of EPS on low dose Abilify as well as free of hallucinations. She has become more accepting of need to stay on meds to stay out of the Hospital.She no longer takes Trazodone to sleep.She is doing well in school-this is her senior year.She says she does not have a boyfriend   At Last visit 01/19/2018: HPI Valerie [name] returns once again with her Mother. She reports no problems with medication except wgt gain. Keeps returning to theme of not wanting to take meds but when reminded she will end up in [facility] again she quickly reverses her stance and is willing to take.She has had no further Psychotic episodes. She says she is doing well in school. Mom tries to get her to exercise but [facility] doesnt care much for exercise either. Says she is doing well in school and is looking forward  to Graduating In June Pt and Mom do not wish to make any changes today to treatment plan.    Today  HPI Valerie [name] returns with her mother for scheduled FU and Medication management of her Paranoid Schizophrenia and OCD She has graduated HS and is going to attend [employer] to study Esthetics     She denies any relapse of paranoia. Mom confirms. She has no problem with her medications.No changes are wanted/needed.They have no questions.  Visit Diagnosis:    ICD-10-CM   1. Paranoid schizophrenia  ([place]) F20.0   2. Obsessive-compulsive disorder, unspecified type F42.9   3. Medication management Z79.899   4. Class 2 obesity without serious comorbidity with body mass index (BMI) of 35.0 to 35.9 in adult, unspecified obesity type E66.9    Z68.35     Allergies:  Allergies  Allergen Reactions  . Sulfa Antibiotics Hives and Itching    Hives on face    Metabolic Disorder Labs: Lab Results  Component Value Date   HGBA1C 5.3 06/03/2017   MPG 105.41 06/03/2017   Lab Results  Component Value Date   PROLACTIN 79.1 (H) 06/04/2017   Lab Results  Component Value Date   CHOL 172 (H) 06/04/2017   TRIG 144 06/04/2017   HDL 69 06/04/2017   CHOLHDL 2.5 06/04/2017   VLDL 29 06/04/2017   LDLCALC 74 06/04/2017   Lab Results  Component Value Date   TSH 2.863 06/03/2017    Therapeutic Level Labs:NA  Current Medications: Current Outpatient Medications  Medication Sig Dispense Refill  . SPRINTEC 28 0.25-35 MG-MCG tablet Take 1 tablet by mouth daily.  11  . ARIPiprazole (ABILIFY) 10 MG tablet Take 1 tablet (10 mg total) by mouth daily. 90 tablet 1  . clomiPRAMINE (ANAFRANIL) 25 MG capsule Take 1 capsule (25 mg total) by mouth at bedtime. 90 capsule 1  . ketoconazole (NIZORAL) 2 % cream     . pantoprazole (PROTONIX) 40 MG tablet Take 1 tablet (40 mg total) by mouth daily. (Patient not taking: Reported on 01/19/2018) 30 tablet 0  .  simethicone (GAS-X) 80 MG chewable tablet Chew 1 tablet (80 mg total) by mouth every 6 (six) hours as needed for flatulence. (Patient not taking: Reported on 01/19/2018) 30 tablet 0   No current facility-administered medications for this visit.     Musculoskeletal: Strength & Muscle Tone: within normal limits Gait & Station: normal Patient leans: N/A  Psychiatric Specialty Exam: Review of Systems  Constitutional: Negative for chills, diaphoresis, fever, malaise/fatigue and weight loss.  Eyes: Negative for blurred vision, double vision, photophobia, pain,  discharge and redness.  Musculoskeletal: Negative for back pain, falls, joint pain, myalgias and neck pain.  Neurological: Negative for dizziness, tingling, tremors, sensory change, speech change, focal weakness, seizures, loss of consciousness, weakness and headaches.  Psychiatric/Behavioral: Negative for depression, hallucinations, memory loss, substance abuse and suicidal ideas. The patient is not nervous/anxious and does not have insomnia.        Paranoid Schizophrenia and OCD in remission on medication    Blood pressure 110/72, pulse (!) 126, height 5' 0.05" (1.525 m), weight 182 lb (82.6 kg).Body mass index is 35.49 kg/m.  General Appearance: Casual, Well Groomed and Obese  Eye Contact:  Good  Speech:  Clear and Coherent  Volume:  Normal  Mood:  Anxious  Affect:  Appropriate, Congruent and Full Range  Thought Process:  Coherent and Descriptions of Associations: Intact  Orientation:  Full (Time, Place, and Person)  Thought Content: WDL   Suicidal Thoughts:  No  Homicidal Thoughts:  No  Memory:  Negative  Judgement:  Intact  Insight:  Fair  Psychomotor Activity:  Normal  Concentration:  Concentration: Good and Attention Span: Good  Recall:  Section of Knowledge: Fair  Language: Good  Akathisia:  Negative  Handed:  Right  AIMS (if indicated): No change 0  Assets:  Desire for Improvement Financial Resources/Insurance Housing Resilience Social Support Talents/Skills Transportation Vocational/Educational  ADL's:  Intact  Cognition: WNL  Sleep:  Good   Screenings: AIMS     Office Visit from 01/19/2018 in [place] Office Visit from 10/27/2017 in [place] Admission (Discharged) from 06/02/2017 in [place] Office Visit from 09/30/2016 in [place] Total Score  0  0  0  0    AUDIT     Admission (Discharged)  from 06/02/2017 in [place] CHILD/ADOLES 100B  Alcohol Use Disorder Identification Test Final Score (AUDIT)  0       Assessment and Plan:   Schizophrenia (Paranoid) Continue Abilify 10 mg qam  OCD: Continue Anafranil HS  Obesity See/Follow with PCP Care everywhere reviewed-   [name], PA-C 04/13/2018, 11:13 AM

## 2018-07-13 ENCOUNTER — Ambulatory Visit (INDEPENDENT_AMBULATORY_CARE_PROVIDER_SITE_OTHER): Payer: 59 | Admitting: Medical

## 2018-07-13 ENCOUNTER — Encounter (HOSPITAL_COMMUNITY): Payer: Self-pay | Admitting: Medical

## 2018-07-13 VITALS — BP 114/74 | HR 107 | Ht 60.0 in | Wt 186.0 lb

## 2018-07-13 DIAGNOSIS — E669 Obesity, unspecified: Secondary | ICD-10-CM | POA: Diagnosis not present

## 2018-07-13 DIAGNOSIS — Z6835 Body mass index (BMI) 35.0-35.9, adult: Secondary | ICD-10-CM

## 2018-07-13 DIAGNOSIS — F429 Obsessive-compulsive disorder, unspecified: Secondary | ICD-10-CM | POA: Diagnosis not present

## 2018-07-13 DIAGNOSIS — F200 Paranoid schizophrenia: Secondary | ICD-10-CM

## 2018-07-13 DIAGNOSIS — Z79899 Other long term (current) drug therapy: Secondary | ICD-10-CM

## 2018-07-13 MED ORDER — CLOMIPRAMINE HCL 25 MG PO CAPS: 25.0000 mg | ORAL_CAPSULE | Freq: Every day | ORAL | 1 refills | Status: DC

## 2018-07-13 NOTE — Progress Notes (Signed)
[place] MD/PA/NP OP Progress Note  07/13/2018 11:41 AM Valerie [name]  MRN:  [id number]  Chief Complaint:  Chief Complaint    Follow-up; Medication Refill; Paranoid; OCD     HPI:  At prior visit 01/19/2018: HPI Valerie [name] returns once again with her Mother. She reports no problems with medication except wgt gain. Keeps returning to theme of not wanting to take meds but when reminded she will end up in [facility] again she quickly reverses her stance and is willing to take.She has had no further Psychotic episodes. She says she is doing well in school. Mom tries to get her to exercise but [facility] doesnt care much for exercise either. Says she is doing well in school and is looking forward to Graduating In June Pt and Mom do not wish to make any changes today to treatment plan.  At Last visit 04/13/2018 HPI Valerie [name] returns with her mother for scheduled FU and Medication management of her Paranoid Schizophrenia and OCD She has graduated HS and is going to attend [employer] to study Esthetics  She denies any relapse of paranoia. Mom confirms. She has no problem with her medications.No changes are wanted/needed.They have no questions.  Today 07/13/2018: Valerie [name] Mustard returns with her mother for routine FU and medication management for Paranoid schizophrenia and OCD. She has been stable on Abilify 10 mg and Anafranil 25 mg HS. She has returned to college and is taking [place] and Math while she awaits getting off the waiting list for the  Aesthetics course she wants to take. She denies any problems with her medications.Mom says she has not had any further psychotic episodes and her OCD is in remission as well.  Visit Diagnosis:    ICD-10-CM   1. Paranoid schizophrenia ([place]) F20.0   2. Obsessive-compulsive disorder, unspecified type F42.9   3. Medication management Z79.899   4. Class 2 obesity without serious comorbidity with body mass index (BMI) of 35.0 to 35.9 in adult, unspecified obesity type E66.9     Z68.35   Allergies:  Allergies  Allergen Reactions  . Sulfa Antibiotics Hives and Itching    Hives on face    Metabolic Disorder Labs: Lab Results  Component Value Date   HGBA1C 5.3 06/03/2017   MPG 105.41 06/03/2017   Lab Results  Component Value Date   PROLACTIN 79.1 (H) 06/04/2017   Lab Results  Component Value Date   CHOL 172 (H) 06/04/2017   TRIG 144 06/04/2017   HDL 69 06/04/2017   CHOLHDL 2.5 06/04/2017   VLDL 29 06/04/2017   LDLCALC 74 06/04/2017   Lab Results  Component Value Date   TSH 2.863 06/03/2017    Therapeutic Level Labs:NA  Current Medications: Current Outpatient Medications  Medication Sig Dispense Refill  . ARIPiprazole (ABILIFY) 10 MG tablet Take 1 tablet (10 mg total) by mouth daily. 90 tablet 1  . SPRINTEC 28 0.25-35 MG-MCG tablet Take 1 tablet by mouth daily.  11  . clomiPRAMINE (ANAFRANIL) 25 MG capsule Take 1 capsule (25 mg total) by mouth at bedtime. 90 capsule 1  . ketoconazole (NIZORAL) 2 % cream     . pantoprazole (PROTONIX) 40 MG tablet Take 1 tablet (40 mg total) by mouth daily. (Patient not taking: Reported on 07/13/2018) 30 tablet 0  . simethicone (GAS-X) 80 MG chewable tablet Chew 1 tablet (80 mg total) by mouth every 6 (six) hours as needed for flatulence. (Patient not taking: Reported on 01/19/2018) 30 tablet 0   No current facility-administered medications for this  visit.      Musculoskeletal: Strength & Muscle Tone: within normal limits Gait & Station: normal Patient leans: N/A  Psychiatric Specialty Exam: Review of Systems  Constitutional: Negative for chills, diaphoresis, fever, malaise/fatigue and weight loss.  Eyes: Negative for blurred vision, double vision, photophobia, pain, discharge and redness.  Musculoskeletal: Negative for back pain, falls, joint pain, myalgias and neck pain.  Neurological: Negative for dizziness, tingling, tremors, sensory change, speech change, focal weakness, seizures, loss of consciousness,  weakness and headaches.  Psychiatric/Behavioral: Negative for depression, hallucinations, memory loss, substance abuse and suicidal ideas. The patient is not nervous/anxious and does not have insomnia.     Blood pressure 114/74, pulse (!) 107, height 5' (1.524 m), weight 186 lb (84.4 kg), SpO2 98 %.Body mass index is 36.33 kg/m.  General Appearance: Well Groomed  Eye Contact:  Good  Speech:  Clear and Coherent  Volume:  Normal  Mood:  Euthymic  Affect:  Congruent  Thought Process:  Coherent, Goal Directed and Descriptions of Associations: Intact  Orientation:  Full (Time, Place, and Person)  Thought Content: WDL   Suicidal Thoughts:  No  Homicidal Thoughts:  No  Memory:  Negative  Judgement:  Intact  Insight:  Present  Psychomotor Activity:  Normal  Concentration:  Concentration: Good and Attention Span: Good  Recall:  Good  Fund of Knowledge: WDL  Language: WDL  Akathisia:  Negative  Handed:  Right  AIMS (if indicated): done  Assets:  Desire for Improvement Financial Resources/Insurance [place] Talents/Skills Transportation Vocational/Educational  ADL's:  Intact  Cognition: WNL  Sleep:  No complaint   Screenings: AIMS     Office Visit from 07/13/2018 in [place] Office Visit from 01/19/2018 in [place] Office Visit from 10/27/2017 in [place] Admission (Discharged) from 06/02/2017 in [place] Office Visit from 09/30/2016 in [place] Total Score  0  0  0  0  0    AUDIT     Admission (Discharged) from 06/02/2017 in [place] CHILD/ADOLES 100B  Alcohol Use Disorder Identification Test Final Score (AUDIT)  0       Assessment  Schizophrenia and OCD in remission (Full)  and Plan: No  change  Schizophrenia (Paranoid) Continue Abilify 10 mg qam  OCD: Continue Anafranil HS  Obesity See/Follow with PCP Care everywhere reviewed-  [name], PA-C 07/13/2018, 11:41 AM

## 2018-10-12 ENCOUNTER — Other Ambulatory Visit: Payer: Self-pay

## 2018-10-12 ENCOUNTER — Encounter (HOSPITAL_COMMUNITY): Payer: Self-pay | Admitting: Medical

## 2018-10-12 ENCOUNTER — Ambulatory Visit (INDEPENDENT_AMBULATORY_CARE_PROVIDER_SITE_OTHER): Payer: 59 | Admitting: Medical

## 2018-10-12 VITALS — BP 124/80 | HR 117 | Ht 60.5 in | Wt 188.0 lb

## 2018-10-12 DIAGNOSIS — F429 Obsessive-compulsive disorder, unspecified: Secondary | ICD-10-CM | POA: Diagnosis not present

## 2018-10-12 DIAGNOSIS — F22 Delusional disorders: Secondary | ICD-10-CM | POA: Diagnosis not present

## 2018-10-12 DIAGNOSIS — E669 Obesity, unspecified: Secondary | ICD-10-CM | POA: Diagnosis not present

## 2018-10-12 DIAGNOSIS — Z79899 Other long term (current) drug therapy: Secondary | ICD-10-CM | POA: Diagnosis not present

## 2018-10-12 DIAGNOSIS — Z6835 Body mass index (BMI) 35.0-35.9, adult: Secondary | ICD-10-CM

## 2018-10-12 MED ORDER — CLOMIPRAMINE HCL 25 MG PO CAPS: 25.0000 mg | ORAL_CAPSULE | Freq: Every day | ORAL | 1 refills | Status: DC

## 2018-10-12 MED ORDER — ARIPIPRAZOLE 10 MG PO TABS
10.0000 mg | ORAL_TABLET | Freq: Every day | ORAL | 1 refills | Status: DC
Start: 2018-10-12 — End: 2019-01-04

## 2018-10-12 NOTE — Progress Notes (Signed)
BH MD/PA/NP OP Progress Note  10/12/2018 11:10 AM Valerie [name]  MRN:  [id number]  Chief Complaint:  Chief Complaint    Follow-up; Medication Refill; Paranoid Psychosis; OCD     HPI: At PRIOR visit 04/13/2018 Valerie [name] returns with her mother for scheduled FU and Medication management of her Paranoid Schizophrenia and OCD She has graduated HS and is going to attend [employer] to study Esthetics  She denies any relapse of paranoia. Mom confirms. She has no problem with her medications.No changes are wanted/needed.They have no questions.  At Last Visit 07/13/2018: Valerie [name] returns with her mother for routine FU and medication management for Paranoid schizophrenia and OCD. She has been stable on Abilify 10 mg and Anafranil 25 mg HS. She has returned to college and is taking [place] and Math while she awaits getting off the waiting list for the  Aesthetics course she wants to take. She denies any problems with her medications.Mom says she has not had any further psychotic episodes and her OCD is in remission as well.  Today 10/12/18: Valerie [name] returns with her mother to report she is working in the [employer] and has started her Aesthetics Course!! She reports this remarkable progress on her current treatment/medications She denies any problems with medications and does not want to change anything. Mom concurs. She also reports she is going to [facility] for Christmas and then to [place] for the New year with mom and dad to see family  Visit Diagnosis:    ICD-10-CM   1. 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 Obsessive-compulsive disorder, unspecified type F42.9   3. Medication management Z79.899   4. Class 2 obesity without serious comorbidity with body mass index (BMI) of 35.0 to 35.9 in adult, unspecified obesity type E66.9    Z68.35     Allergies:  Allergies  Allergen Reactions  . Sulfa Antibiotics Hives and Itching    Hives on face    Metabolic Disorder Labs: Lab Results   Component Value Date   HGBA1C 5.3 06/03/2017   MPG 105.41 06/03/2017   Lab Results  Component Value Date   PROLACTIN 79.1 (H) 06/04/2017   Lab Results  Component Value Date   CHOL 172 (H) 06/04/2017   TRIG 144 06/04/2017   HDL 69 06/04/2017   CHOLHDL 2.5 06/04/2017   VLDL 29 06/04/2017   LDLCALC 74 06/04/2017   Lab Results  Component Value Date   TSH 2.863 06/03/2017    Current Outpatient Medications  Medication Sig Dispense Refill  . ARIPiprazole (ABILIFY) 10 MG tablet Take 1 tablet (10 mg total) by mouth daily. 90 tablet 1  . clomiPRAMINE (ANAFRANIL) 25 MG capsule Take 1 capsule (25 mg total) by mouth at bedtime. 90 capsule 1  . ketoconazole (NIZORAL) 2 % cream     . pantoprazole (PROTONIX) 40 MG tablet Take 1 tablet (40 mg total) by mouth daily. 30 tablet 0  . simethicone (GAS-X) 80 MG chewable tablet Chew 1 tablet (80 mg total) by mouth every 6 (six) hours as needed for flatulence. 30 tablet 0  . SPRINTEC 28 0.25-35 MG-MCG tablet Take 1 tablet by mouth daily.  11   No current facility-administered medications for this visit.      Musculoskeletal: Strength & Muscle Tone: within normal limits Gait & Station: normal Patient leans: N/A  Psychiatric Specialty Exam: Review of Systems  Constitutional: Negative for chills, diaphoresis, fever, malaise/fatigue and weight loss.  Genitourinary: Negative for dysuria, flank pain, frequency, hematuria and urgency.  Musculoskeletal: Negative  for back pain, falls, joint pain, myalgias and neck pain.  Neurological: Negative for dizziness, tingling, tremors, sensory change, speech change, focal weakness, seizures, loss of consciousness, weakness and headaches.  Endo/Heme/Allergies: Negative for environmental allergies and polydipsia. Does not bruise/bleed easily.  Psychiatric/Behavioral: Negative for depression, hallucinations, memory loss, substance abuse and suicidal ideas. The patient is not nervous/anxious and does not have  insomnia.     Blood pressure 124/80, pulse (!) 117, height 5' 0.5" (1.537 m), weight 188 lb (85.3 kg).Body mass index is 36.11 kg/m.  General Appearance: Well Groomed  Eye Contact:  Good  Speech:  Clear and Coherent, Normal Rate and Voluntary  Volume:  Normal  Mood:  Euthymic  Affect:  Congruent  Thought Process:  Coherent and Descriptions of Associations: Intact  Orientation:  Full (Time, Place, and Person)  Thought Content: WDL and Logical   Suicidal Thoughts:  No  Homicidal Thoughts:  No  Memory:  Negative  Judgement:  Intact  Insight:  Present  Psychomotor Activity:  Normal  Concentration:  Concentration: Good and Attention Span: Good  Recall:  Good  Fund of Knowledge: WDL  Language: WDL  Akathisia:  NA  Handed:  Right  AIMS (if indicated): not done  Assets:  Desire for Improvement Financial Resources/Insurance Housing Resilience Social Support Transportation Vocational/Educational  ADL's:  Intact  Cognition: WNL  Sleep:  Good   Screenings: AIMS     Office Visit from 07/13/2018 in [place] Office Visit from 01/19/2018 in [place] Office Visit from 10/27/2017 in [place] Admission (Discharged) from 06/02/2017 in [place] Office Visit from 09/30/2016 in [place] Total Score  0  0  0  0  0    AUDIT     Admission (Discharged) from 06/02/2017 in [place] CHILD/ADOLES 100B  Alcohol Use Disorder Identification Test Final Score (AUDIT)  0       Assessment : Remarkable transformation-much more relaxed;talks freely in full remission   and Plan: Continue current medications FU 3 months sooner if needed   [name], PA-C 10/12/2018, 11:10 AM

## 2018-10-14 ENCOUNTER — Encounter (HOSPITAL_COMMUNITY): Payer: Self-pay | Admitting: Medical

## 2018-11-02 DIAGNOSIS — L83 Acanthosis nigricans: Secondary | ICD-10-CM | POA: Insufficient documentation

## 2018-12-28 ENCOUNTER — Other Ambulatory Visit (HOSPITAL_COMMUNITY): Payer: Self-pay | Admitting: Medical

## 2019-01-04 ENCOUNTER — Ambulatory Visit (HOSPITAL_COMMUNITY): Payer: 59 | Admitting: Medical

## 2019-01-04 ENCOUNTER — Other Ambulatory Visit: Payer: Self-pay

## 2019-01-04 ENCOUNTER — Encounter (HOSPITAL_COMMUNITY): Payer: Self-pay | Admitting: Medical

## 2019-01-04 VITALS — BP 122/80 | HR 104 | Ht 60.5 in | Wt 188.0 lb

## 2019-01-04 DIAGNOSIS — F258 Other schizoaffective disorders: Secondary | ICD-10-CM

## 2019-01-04 DIAGNOSIS — Z79899 Other long term (current) drug therapy: Secondary | ICD-10-CM | POA: Diagnosis not present

## 2019-01-04 DIAGNOSIS — F23 Brief psychotic disorder: Secondary | ICD-10-CM | POA: Diagnosis not present

## 2019-01-04 DIAGNOSIS — G4709 Other insomnia: Secondary | ICD-10-CM

## 2019-01-04 DIAGNOSIS — R443 Hallucinations, unspecified: Secondary | ICD-10-CM

## 2019-01-04 DIAGNOSIS — R450 Nervousness: Secondary | ICD-10-CM

## 2019-01-04 DIAGNOSIS — F429 Obsessive-compulsive disorder, unspecified: Secondary | ICD-10-CM | POA: Diagnosis not present

## 2019-01-04 MED ORDER — CLOMIPRAMINE HCL 25 MG PO CAPS: 25.0000 mg | ORAL_CAPSULE | Freq: Every day | ORAL | 0 refills | Status: DC

## 2019-01-04 MED ORDER — ARIPIPRAZOLE 10 MG PO TABS: 10.0000 mg | ORAL_TABLET | Freq: Every day | ORAL | 0 refills | Status: DC

## 2019-01-04 NOTE — Progress Notes (Signed)
[place] MD/PA/NP OP Progress Note  01/04/2019 11:01 am Valerie [name]  MRN:  [id number]  Chief Complaint:  Chief Complaint    Follow-up; Medication Refill; Schizoaffective DO     HPI:    At PRIOR visit 07/13/2018: Valerie [name] returns with her mother for routine FU and medication management for Paranoid schizophrenia and OCD. She has been stable on Abilify 10 mg and Anafranil 25 mg HS. She has returned to college and is taking [place] and Math while she awaits getting off the waiting list for the Aesthetics course she wants to take. She denies any problems with her medications.Mom says she has not had any further psychotic episodes and her OCD is in remission as well.   At Last Visit 10/12/18: Valerie [name] returns with her mother to report she is working in the [employer] and has started her Aesthetics Course!! She reports this remarkable progress on her current treatment/medications She denies any problems with medications and does not want to change anything. Mom concurs. She also reports she is going to [place] for Christmas and then to [place] for the New year with mom and dad to see family  Today 01/04/2019 Valerie [name] returns for her 3 month FU of her brief psychotic episodes (2)(?Schizoaffective disorder)) and subsequent development of OCD and for medication management  .She continues to be asymptomatic.She continues to attend [employer] in Esthetics.She is on spring break. Her job at [employer] was terminated due to slow business. Mom has encouraged her to look outside College for employment. Valerie [name] reports no problems with her medications.She does not wish to change anythying.She has had no further psychotic episodes since July/August of 2018..Mom affirms this history. Care everywhere reviewed-  Visit Diagnosis:    ICD-10-CM   1. Other schizoaffective disorders ([place]) F25.8   2. 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 Obsessive-compulsive disorder, unspecified type  F42.9   4. Medication management Z79.899     Allergies:  Allergies  Allergen Reactions  . Sulfa Antibiotics Hives and Itching    Hives on face    Metabolic Disorder Labs: Lab Results  Component Value Date   HGBA1C 5.3 06/03/2017   MPG 105.41 06/03/2017   Lab Results  Component Value Date   PROLACTIN 79.1 (H) 06/04/2017   Lab Results  Component Value Date   CHOL 172 (H) 06/04/2017   TRIG 144 06/04/2017   HDL 69 06/04/2017   CHOLHDL 2.5 06/04/2017   VLDL 29 06/04/2017   LDLCALC 74 06/04/2017   Lab Results  Component Value Date   TSH 2.863 06/03/2017    Therapeutic Level Labs:NA  Current Medications: Current Outpatient Medications  Medication Sig Dispense Refill  . ARIPiprazole (ABILIFY) 10 MG tablet Take 1 tablet (10 mg total) by mouth daily. 120 tablet 0  . clomiPRAMINE (ANAFRANIL) 25 MG capsule Take 1 capsule (25 mg total) by mouth at bedtime. 120 capsule 0  . ketoconazole (NIZORAL) 2 % cream     . norgestimate-ethinyl estradiol (ORTHO-CYCLEN,SPRINTEC,PREVIFEM) 0.25-35 MG-MCG tablet Take by mouth.    . pantoprazole (PROTONIX) 40 MG tablet Take 1 tablet (40 mg total) by mouth daily. 30 tablet 0  . simethicone (GAS-X) 80 MG chewable tablet Chew 1 tablet (80 mg total) by mouth every 6 (six) hours as needed for flatulence. 30 tablet 0   No current facility-administered medications for this visit.      Musculoskeletal: Strength & Muscle Tone: within normal limits Gait & Station: normal Patient leans: N/A  Review of  Systems  Psychiatric/Behavioral No depression and hallucinations. Negative for suicidal ideas, memory loss and substance abuse. The patient is nervous/anxious and has insomnia.   All other systems reviewed and are negative. Neurologic: Headache: Negative Seizure: Negative Paresthesias: Negative   Mental Status Examination   Appearance: Casually dressed,neat Alert: Yes Attention: Good Cooperative: Yes Eye Contact: Good Speech: normal in  volume, rate, tone, spontaneous Psychomotor Activity: Normal Memory/Concentration: OK Oriented: Full  person, place,time and situation Mood: Euthymic Affect: Congruent and Full Range Thought Processes and Associations: Goal Directed and Intact Fund of Knowledge: WDL Thought Content: Suicidal ideation, Homicidal ideation, Auditory hallucinations, Visual hallucinations, Delusions and Paranoia, none reported Insight: Fair to poor Judgement: Fair to poor Language: Fair  Screenings: AIMS     Office Visit from 07/13/2018 in [place] Office Visit from 01/19/2018 in [place] Office Visit from 10/27/2017 in [place] Admission (Discharged) from 06/02/2017 in [place] Office Visit from 09/30/2016 in [place] Total Score  0  0  0  0  0    AUDIT     Admission (Discharged) from 06/02/2017 in [place] CHILD/ADOLES 100B  Alcohol Use Disorder Identification Test Final Score (AUDIT)  0     Assessment Pt has been stable since August of 2018 suggesting Schizoaffective disorder rather than chronic Paranoid Schizophrenia due to her recovery and ability to work and attend school. There is a 12% comorbidity with OCD which explains her developing this clearly after her psychosis and mood were controlled with medications. It is not clear that she could be medication free at this point. Her last attempt resulted in relapse under stress.   and Plan: No change  Schizophrenia (Paranoid) vs Schizoaffective DO Continue Abilify 10 mg qam  OCD/Mood: Continue Anafranil HS  Obesity See/Follow with PCP    [name], PA-C

## 2019-05-03 ENCOUNTER — Encounter (HOSPITAL_COMMUNITY): Payer: Self-pay | Admitting: Medical

## 2019-05-03 ENCOUNTER — Ambulatory Visit (INDEPENDENT_AMBULATORY_CARE_PROVIDER_SITE_OTHER): Payer: 59 | Admitting: Medical

## 2019-05-03 DIAGNOSIS — Z79899 Other long term (current) drug therapy: Secondary | ICD-10-CM | POA: Diagnosis not present

## 2019-05-03 DIAGNOSIS — F429 Obsessive-compulsive disorder, unspecified: Secondary | ICD-10-CM

## 2019-05-03 DIAGNOSIS — F258 Other schizoaffective disorders: Secondary | ICD-10-CM | POA: Diagnosis not present

## 2019-05-03 DIAGNOSIS — Z6835 Body mass index (BMI) 35.0-35.9, adult: Secondary | ICD-10-CM

## 2019-05-03 DIAGNOSIS — E669 Obesity, unspecified: Secondary | ICD-10-CM

## 2019-05-03 DIAGNOSIS — F411 Generalized anxiety disorder: Secondary | ICD-10-CM

## 2019-05-03 MED ORDER — CLOMIPRAMINE HCL 25 MG PO CAPS: 25.0000 mg | ORAL_CAPSULE | Freq: Every day | ORAL | 0 refills | Status: DC

## 2019-05-03 MED ORDER — ARIPIPRAZOLE 10 MG PO TABS: 10.0000 mg | ORAL_TABLET | Freq: Every day | ORAL | 0 refills | Status: DC

## 2019-05-03 NOTE — Progress Notes (Signed)
Virtual Visit via Video Note  I connected with [name] on 05/03/19 at 11:00 AM EDT by a video enabled telemedicine application* and verified that I am speaking with the correct person using two identifiers. *Pt only applied audio  I discussed the limitations of evaluation and management by telemedicine and the availability of in person appointments. The patient expressed understanding and agreed to proceed.  History of Present Illness:See EPIC note    Observations/Objective:See EPIC note   Assessment and Plan:See EPIC note   Follow Up Instructions:See EPIC note    I discussed the assessment and treatment plan with the patient. The patient was provided an opportunity to ask questions and all were answered. The patient agreed with the plan and demonstrated an understanding of the instructions.   The patient was advised to call back or seek an in-person evaluation if the symptoms worsen or if the condition fails to improve as anticipated.  I provided 15 minutes of non-face-to-face time during this encounter.   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 She reports this remarkable progress on her current treatment/medications She denies any problems with medications and does not want to change anything. Mom concurs. She also reports she is going to [facility] for Christmas and then to [place] for the New year with mom and dad to see family  At Last Visit 01/04/2019 HPI: Matthias Hughs returns for her 3 month FU of her brief psychotic episodes (2)(?Schizoaffective disorder)) and subsequent development of OCD and for medication management  .She continues to be asymptomatic.She continues to attend [employer] in Esthetics.She is on spring break. Her job at [employer] was terminated due to slow business. Mom has encouraged her to look outside College for employment. [name] reports no problems with her medications.She does not wish to change anythying.She has had no further psychotic episodes since July/August of 2018..Mom affirms this history. Care everywhere reviewed-  Today 05/03/19: HPI: [name] returns via [facility] audio for scheduled FU during COVID 19 pandemic.Mom is present also. She has not been able to work. Her school work has been limited to virtual but Iantha Fallen is scheduled to speak about school opening soon. In the meantime, she says she has been "exercising".She is having no trouble with symptoms/medication.  Visit Diagnosis:    ICD-10-CM   1. Other schizoaffective disorders ([place])  F25.8   2. Obsessive-compulsive disorder, unspecified type  F42.9   3. Medication management  Z79.899   4. GAD (generalized anxiety disorder)  F41.1   5. Class 2 obesity without serious comorbidity with body mass index (BMI) of 35.0 to 35.9 in adult, unspecified obesity type  E66.9    Z68.35   Allergies:  Allergies  Allergen Reactions  . Sulfa Antibiotics Hives and Itching    Hives on face    Metabolic Disorder Labs: Lab Results  Component Value Date   HGBA1C 5.3 06/03/2017   MPG 105.41 06/03/2017   Lab Results  Component Value Date   PROLACTIN 79.1 (H) 06/04/2017   Lab Results  Component Value Date   CHOL 172 (H) 06/04/2017   TRIG 144 06/04/2017   HDL 69 06/04/2017   CHOLHDL 2.5 06/04/2017   VLDL 29 06/04/2017  [place] 74 06/04/2017   Lab Results  Component Value Date   TSH 2.863 06/03/2017    Current Medications: Current Outpatient Medications  Medication Sig Dispense Refill  . ARIPiprazole (ABILIFY) 10 MG tablet Take 1 tablet (10 mg total) by mouth daily. 120 tablet 0  . clomiPRAMINE (ANAFRANIL) 25 MG capsule Take 1 capsule (25 mg total) by mouth at bedtime. 120  capsule 0  . ketoconazole (NIZORAL) 2 % cream     . norgestimate-ethinyl estradiol (ORTHO-CYCLEN,SPRINTEC,PREVIFEM) 0.25-35 MG-MCG tablet Take by mouth.    . pantoprazole (PROTONIX) 40 MG tablet Take 1 tablet (40 mg total) by mouth daily. 30 tablet 0  . simethicone (GAS-X) 80 MG chewable tablet Chew 1 tablet (80 mg total) by mouth every 6 (six) hours as needed for flatulence. 30 tablet 0   No current facility-administered medications for this visit.      Musculoskeletal: ALL NA PHONE VISIT Strength & Muscle Tone:  Gait & Station: Patient leans:   Psychiatric Specialty Exam: Review of Systems  Constitutional: Positive for weight loss (attending wgt management). Negative for chills, diaphoresis, fever and malaise/fatigue.  Musculoskeletal: Negative for back pain, falls, joint pain, myalgias and neck pain.  Neurological: Negative for dizziness, tingling, tremors, sensory change, speech change, focal weakness, seizures, loss of consciousness, weakness and headaches.  Psychiatric/Behavioral: Negative for depression, hallucinations, memory loss, substance abuse and suicidal ideas. The patient is not nervous/anxious and does not have insomnia.     There were no vitals taken for this visit.This was a [place] Visit by phone  General Appearance: NA  Eye Contact:  NA  Speech:  Clear and Coherent  Volume:  Normal  Mood:  Euthymic  Affect:  NA  Thought Process:  Coherent and Descriptions of Associations: Intact  Orientation:  Full (Time, Place, and Person)  Thought Content: WDL and Logical   Suicidal Thoughts:  No  Homicidal Thoughts:  No  Memory:  Negative  Judgement:  Intact  Insight:  Present  Psychomotor Activity:  NA  Concentration:  Concentration: Good and Attention Span: Good  Recall:  NA  Fund of Knowledge: WDL  Language: Bilingual  Akathisia:  phone visit  Handed:  Right  AIMS (if indicated): not done  Assets:  Communication Skills Desire for Improvement Financial  Resources/Insurance Housing Resilience Social Support Talents/Skills Transportation Vocational/Educational  ADL's:  Intact  Cognition: WNL  Sleep:  Negative   Screenings: AIMS     Office Visit from 07/13/2018 in [place] Office Visit from 01/19/2018 in [place] Office Visit from 10/27/2017 in [place] Admission (Discharged) from 06/02/2017 in [place] Office Visit from 09/30/2016 in [place] Total Score  0  0  0  0  0    AUDIT     Admission (Discharged) from 06/02/2017 in [place] CHILD/ADOLES 100B  Alcohol Use Disorder Identification Test Final Score (AUDIT)  0       Assessment : Pt stable/asymptomatic  and Plan: Meds refilled.4 month FU sooner if needed   [name], PA-C 05/03/2019, 4:30 PM

## 2019-09-06 ENCOUNTER — Ambulatory Visit (INDEPENDENT_AMBULATORY_CARE_PROVIDER_SITE_OTHER): Payer: 59 | Admitting: Medical

## 2019-09-06 ENCOUNTER — Encounter (HOSPITAL_COMMUNITY): Payer: Self-pay | Admitting: Medical

## 2019-09-06 ENCOUNTER — Other Ambulatory Visit: Payer: Self-pay

## 2019-09-06 DIAGNOSIS — E669 Obesity, unspecified: Secondary | ICD-10-CM | POA: Diagnosis not present

## 2019-09-06 DIAGNOSIS — F258 Other schizoaffective disorders: Secondary | ICD-10-CM

## 2019-09-06 DIAGNOSIS — Z79899 Other long term (current) drug therapy: Secondary | ICD-10-CM

## 2019-09-06 DIAGNOSIS — F429 Obsessive-compulsive disorder, unspecified: Secondary | ICD-10-CM | POA: Diagnosis not present

## 2019-09-06 DIAGNOSIS — Z6835 Body mass index (BMI) 35.0-35.9, adult: Secondary | ICD-10-CM

## 2019-09-06 MED ORDER — ARIPIPRAZOLE 10 MG PO TABS: 10.0000 mg | ORAL_TABLET | Freq: Every day | ORAL | 0 refills | Status: DC

## 2019-09-06 MED ORDER — CLOMIPRAMINE HCL 25 MG PO CAPS: 25.0000 mg | ORAL_CAPSULE | Freq: Every day | ORAL | 0 refills | Status: DC

## 2019-09-06 NOTE — Progress Notes (Signed)
Virtual Visit via Video Note  I connected with Valerie [name] on 09/06/19 at 11:00 AM EST by a video enabled telemedicine application and verified that I am speaking with the correct person using two identifiers.   I discussed the limitations of evaluation and management by telemedicine and the availability of in person appointments. The patient expressed understanding and agreed to proceed.  History of Present Illness:See EPIC note    Observations/Objective:See EPIC note   Assessment and Plan:See EPIC note   Follow Up Instructions:See EPIC note   I discussed the assessment and treatment plan with the patient. The patient was provided an opportunity to ask questions and all were answered. The patient agreed with the plan and demonstrated an understanding of the instructions.   The patient was advised to call back or seek an in-person evaluation if the symptoms worsen or if the condition fails to improve as anticipated.  I provided 15 minutes of non-face-to-face time during this encounter.   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 She has begun to go to the gym and has lost 8 lbs which she is happy about. She says she likes going to work out now which was not the case earlier. She continues to take her medication and denies any problems/side effects. REVIEW of Care Everywhere-no changes  Visit Diagnosis:    ICD-10-CM   1. Other schizoaffective disorders ([place])  F25.8   2. Obsessive-compulsive disorder, unspecified type  F42.9   3. Medication management  Z79.899   4. Class 2 obesity without serious comorbidity with  body mass index (BMI) of 35.0 to 35.9 in adult, unspecified obesity type  E66.9    Z68.35     Past Psychiatric History:  At Prior Visit 10/12/18: Valerie [name] returns with her mother to report she is working in [employer] and has started her Aesthetics Course!! She reports this remarkable progress on her current treatment/medications She denies any problems with medications and does not want to change anything. Mom concurs. 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 Her job at [employer] was terminated due to slow business. Mom has encouraged her to look outside College for employment. Valerie [name] reports no problems with her medications.She does not wish to change anythying.She has had no further psychotic episodes since July/August of 2018..Mom affirms this history. Care everywhere reviewed-  At Last Visit 05/03/19: HPI: Valerie [name] returns via [facility] audio for scheduled FU during COVID 19 pandemic.Mom is present also. She has not been able to work. Her school work has been limited to virtual but Valerie [name] is scheduled to speak about school opening soon. In the meantime, she says she has been "exercising".She is having no trouble with symptoms/medication.  Psychiatric Initial Child/Adolescent Assessment  Patient Identification: Valerie [name] MRN:  [id number] Date of Evaluation:  03/20/2015 Referral Source: Dr. Selena Batten Chief Complaint:  depression with paranoia Visit Diagnosis: No diagnosis found. History of Present Illness:: Patient and father noted that 1st episode began in November with a few days of  irritability, anxiety, depression, and anger. This resolved.  She has had two more severe episodes since then between February and now where she is irritable, short tempered, appears to be responding to internal stimulation, reporting auditory hallucinations, cursing at family. Her grades have dropped from As and Bs to possibly needing Summer school.   Past Medical History:  Past Medical History:  Diagnosis Date  . Anxiety   . Depression   . Fatigue   . Headache   . Psychosis ([place]) 06/03/2017   No past surgical history on file.  Family Psychiatric History: See Family history  Family History: [facility] reviewed Family History:  2 Paternal second cousins with autism Maternal 1st cousin with autism  Paternal 1st cousin with a tumor in his neck,     Social History:  Social History   Socioeconomic History  . Marital status: Single    Spouse name: NA  . Number of children: NA  . Years of education: Currently Attending [employer] in Esthetics Program  . Highest education level: College as above  Occupational History  . Worked in [place]  . Financial resource strain: Not on file  . Food insecurity    Worry: Not on file    Inability: Not on file  . Transportation needs    Medical: No    Non-medical: No  Tobacco Use  . Smoking status: Never Smoker  . Smokeless tobacco: Never Used  Substance and Sexual Activity  . Alcohol use: No  . Drug use: No  . Sexual activity: Never    Birth control/protection: Pill, Abstinence  Lifestyle  . Physical activity Has started going to [place] it now and has lost 6 lbs    Days per week: 3-5    Minutes per session: Not on file  . Stress: COVID restrictions  Relationships  . Social Herbalist on phone: Not on file    Gets together: Not on file    Attends religious service: Not on file    Active member of club or organization: Not on file    Attends meetings of clubs or organizations: Not on file     Relationship status: No boyfriend  Other Topics Concern  . None  Social History Narrative  . See notes    Allergies:  Allergies  Allergen Reactions  . Sulfa Antibiotics Hives and Itching    Hives on face    Metabolic Disorder Labs: Lab Results  Component Value Date   HGBA1C 5.3 06/03/2017   MPG 105.41 06/03/2017   Lab Results  Component Value Date   PROLACTIN 79.1 (H) 06/04/2017   Lab Results  Component Value Date   CHOL 172 (H) 06/04/2017   TRIG 144 06/04/2017   HDL 69 06/04/2017   CHOLHDL 2.5 06/04/2017   VLDL 29 06/04/2017   LDLCALC 74 06/04/2017   Lab Results  Component Value Date   TSH 2.863 06/03/2017    Therapeutic Level Labs:NA  Current Medications: ARIPiprazole 10 MG tablet Commonly known as: ABILIFY Take 1 tablet (10 mg total) by mouth daily.  clomiPRAMINE 25 MG capsule Commonly known as: ANAFRANIL Take 1 capsule (25 mg total) by mouth at bedtime.  ketoconazole 2 % cream Commonly  known as: NIZORAL   norgestimate-ethinyl estradiol 0.25-35 MG-MCG tablet Commonly known as: ORTHO-CYCLEN Take by mouth.  pantoprazole 40 MG tablet Commonly known as: PROTONIX Take 1 tablet (40 mg total) by mouth daily.  simethicone 80 MG chewable tablet Commonly known      Musculoskeletal: Strength & Muscle Tone: Telepsych visit-Grossly normal Musculoskeletal and cranial nerve inspections Gait & Station: NA Patient leans: N/A  Psychiatric Specialty Exam: Review of Systems  Constitutional: Negative for chills, diaphoresis, fever, malaise/fatigue and weight loss.  Gastrointestinal: Negative for abdominal pain, blood in stool, constipation, diarrhea, heartburn, melena, nausea and vomiting.  Genitourinary: Negative for dysuria, flank pain, frequency, hematuria and urgency.  Musculoskeletal: Negative for back pain, falls, joint pain, myalgias and neck pain.  Neurological: Negative for dizziness, tingling, tremors, sensory change, speech change, focal weakness, seizures,  loss of consciousness, weakness and headaches.  Endo/Heme/Allergies: Negative for environmental allergies and polydipsia. Does not bruise/bleed easily.  Psychiatric/Behavioral: Negative for depression, hallucinations, memory loss, substance abuse and suicidal ideas. The patient is not nervous/anxious and does not have insomnia.        Schizoaffective disorder in remission on meds OCD in remission on meds    There were no vitals taken for this visit.There is no height or weight on file to calculate BMI.telepsych visit pt reports wgt 182 lbs  General Appearance: Casual and Well Groomed  Eye Contact:  Good  Speech:  Clear and Coherent and Normal Rate  Volume:  Normal  Mood:  Euthymic  Affect:  Congruent  Thought Process:  Coherent and Descriptions of Associations: Intact  Orientation:  Full (Time, Place, and Person)  Thought Content: WDL and Logical   Suicidal Thoughts:  No  Homicidal Thoughts:  No  Memory:  Negative  Judgement:  Intact  Insight:  Present  Psychomotor Activity:  Normal  Concentration:  Concentration: Good and Attention Span: Good  Recall:  Good  Fund of Knowledge: WDL  Language: WDL  Akathisia:  Negative  Handed:  Right  AIMS (if indicated): done  Assets:  Desire for Improvement Financial Resources/Insurance [place] Talents/Skills Transportation Vocational/Educational  ADL's:  Intact  Cognition: WNL  Sleep:  Negative   Screenings: AIMS     Office Visit from 09/06/2019 in [place] Office Visit from 07/13/2018 in [place] Office Visit from 01/19/2018 in [place] Office Visit from 10/27/2017 in [place] Admission (Discharged) from 06/02/2017 in [place] Total Score  0  0  0  0  0    AUDIT     Admission  (Discharged) from 06/02/2017 in [place] CHILD/ADOLES 100B  Alcohol Use Disorder Identification Test Final Score (AUDIT)  0       Assessment: Valerie [name] has made remarkable strides since her last Psychosis in 2018.            and Plan: Continue current medications                   Continue Gym/Wgt loss measures                   Monitor for AIMS symptoms                   FU 4 months-sooner if needed   Care Everywhere reviwed Over 1/2 visit was spent discussing overall health especially wgt/wgt management.AIMS was  also assessed and discussed particularly monitoring for changes   [name], PA-C 09/06/2019, 2:11 PM

## 2019-09-06 NOTE — Progress Notes (Signed)
Limited exam is negative

## 2019-12-27 ENCOUNTER — Ambulatory Visit (INDEPENDENT_AMBULATORY_CARE_PROVIDER_SITE_OTHER): Payer: 59 | Admitting: Medical

## 2019-12-27 ENCOUNTER — Encounter (HOSPITAL_COMMUNITY): Payer: Self-pay | Admitting: Medical

## 2019-12-27 DIAGNOSIS — F411 Generalized anxiety disorder: Secondary | ICD-10-CM | POA: Diagnosis not present

## 2019-12-27 DIAGNOSIS — F429 Obsessive-compulsive disorder, unspecified: Secondary | ICD-10-CM | POA: Diagnosis not present

## 2019-12-27 DIAGNOSIS — Z79899 Other long term (current) drug therapy: Secondary | ICD-10-CM

## 2019-12-27 DIAGNOSIS — F258 Other schizoaffective disorders: Secondary | ICD-10-CM | POA: Diagnosis not present

## 2019-12-27 DIAGNOSIS — K7581 Nonalcoholic steatohepatitis (NASH): Secondary | ICD-10-CM

## 2019-12-27 DIAGNOSIS — E669 Obesity, unspecified: Secondary | ICD-10-CM

## 2019-12-27 DIAGNOSIS — Z6835 Body mass index (BMI) 35.0-35.9, adult: Secondary | ICD-10-CM

## 2019-12-27 DIAGNOSIS — R7303 Prediabetes: Secondary | ICD-10-CM

## 2019-12-27 MED ORDER — CLOMIPRAMINE HCL 25 MG PO CAPS: 25.0000 mg | ORAL_CAPSULE | Freq: Every day | ORAL | 0 refills | Status: DC

## 2019-12-27 MED ORDER — ARIPIPRAZOLE 10 MG PO TABS: 10.0000 mg | ORAL_TABLET | Freq: Every day | ORAL | 0 refills | Status: DC

## 2019-12-27 NOTE — Progress Notes (Signed)
Virtual Visit via Video Note  I connected with Valerie [name] on 12/27/19 at 11:00 AM EST by a video enabled telemedicine application and verified that I am speaking with the correct person using two identifiers.   I discussed the limitations of evaluation and management by telemedicine and the availability of in person appointments. The patient expressed understanding and agreed to proceed.   History of Present Illness:See EPIC note    Observations/Objective:See EPIC note   Assessment and Plan:See EPIC note   Follow Up Instructions:See EPIC note   I discussed the assessment and treatment plan with the patient. The patient was provided an opportunity to ask questions and all were answered. The patient agreed with the plan and demonstrated an understanding of the instructions.   The patient was advised to call back or seek an in-person evaluation if the symptoms worsen or if the condition fails to improve as anticipated.  I provided 15 minutes of non-face-to-face time during this encounter.   [name], [name]  [facility] MD/PA/NP OP Progress Note  12/27/2019 3:24 PM Valerie [name]  MRN:  [id number]  Chief Complaint:  Chief Complaint    Follow-up; Medication Refill; Anxiety; Agitation     HPI: Pete returns for her 4 month fu for her OCD and ? Schizoaffective disorder. She has had no futher episodes of paranoia/delusions on Abilify. OCD is in remission on Anafranil. She has started back to school  In person 1 day plus online. She has 1 year to go. She enjoys her choice of career.  Visit Diagnosis:    ICD-10-CM   1. Other schizoaffective disorders ([place])  F25.8   2. Obsessive-compulsive disorder, unspecified type  F42.9   3. Medication management  Z79.899   4. GAD (generalized anxiety disorder)  F41.1   5. Class 2 obesity without serious comorbidity with body mass index (BMI) of 35.0 to 35.9 in adult, unspecified obesity type  E66.9    Z68.35   6. Prediabetes  R73.03   7.  Steatohepatitis  K75.81    Care Everywhere Review Past Medical History:  11/02/2018 Annual Physical [facility]  [place], [place] [address]  [phone]  Corrie Dandy, NP  [address]  [place], [place] [address]  [phone]  [phone] (Fax)  Encounter for well woman exam (Primary Dx);  Encounter for surveillance of contraceptive pills;  History of irregular menstrual cycles;  Class 2 obesity with body mass index (BMI) of 35.0 to 35.9 in adult, unspecified obesity type, unspecified whether serious comorbidity present;  Acanthosis nigricans   Date Type Department Care Team Description  09/12/2019 Office Visit [facility]  [place] [place], [place] [address]  [phone]  Worthy Keeler, MD  [address] [place]  [address]  [place], [place] [address]  [phone]  [phone] (Fax)  Elevated liver enzymes (Primary Dx)  Follow up in about 3 months (around 12/13/2019). There are no Patient Instructions on file for this visit. Discussion and Summary: Given her prediabetes, she most likely has steatohepatitis. We will get lab work today to rule out other possible causes of liver disease and for fibrosis screening. I congratulated her on choosing to join a gym and trying to lose weight. We will continue to follow her weight loss going forward. I advised her to call with questions or concerns  NOVANT -reviewed 12/27/2019 Active Problems Problem Noted Date  Acanthosis nigricans 11/02/2018  Obesity 04/13/2018  Overview:  BMI 35   Cognitive and behavioral changes 08/11/2015  OCD (obsessive compulsive disorder) 02/11/2015  Resolved Problems Problem Noted Date Resolved Date  Psychosis 06/03/2017 11/02/2018  MDD (major depressive disorder), recurrent episode, severe 06/02/2017 11/02/2018  Paranoia 02/11/2015 11/02/2018  Sports physical     EPIC Past  Medical History:  Diagnosis Date  . Anxiety   . Depression   . Fatigue   . Headache   . Psychosis ([place]) 06/03/2017   No past surgical history on file. Allergies:  Allergies  Allergen Reactions  . Sulfa Antibiotics Hives and Itching    Hives on face    Metabolic Disorder Labs: Lab Results  Component Value Date   HGBA1C 5.3 06/03/2017   MPG 105.41 06/03/2017   Lab Results  Component Value Date   PROLACTIN 79.1 (H) 06/04/2017   Lab Results  Component Value Date   CHOL 172 (H) 06/04/2017   TRIG 144 06/04/2017   HDL 69 06/04/2017   CHOLHDL 2.5 06/04/2017   VLDL 29 06/04/2017   LDLCALC 74 06/04/2017   01/18/2020 Office Visit Gastroenterology   Haptoglobin 260 33 - 278 mg/dL [facility] 1   Apolipoprotein A-1 214 (H) 116 - 209 mg/dL [facility] 1   Total Bilirubin <0.1 0.0 - 1.2 mg/dL [facility] 1   GGT 78 (H) 0 - 60 IU/L [facility] 1   ALT 56 (H) 0 - 40 IU/L [facility] 1   AST 37 0 - 40 IU/L [facility] 1   Cholesterol, Total 220 (H) 100 - 199 mg/dL [facility] 1   Glucose, Serum 97 65 - 99 mg/dL [facility] 1   Triglycerides 323 (H) 0 - 149 mg/dL [facility] 1     Lab Results  Component Value Date   TSH 2.863 06/03/2017    Current Medications: Current Outpatient Medications  Medication Sig Dispense Refill  . norgestimate-ethinyl estradiol (ORTHO-CYCLEN) 0.25-35 MG-MCG tablet Take by mouth.    . ARIPiprazole (ABILIFY) 10 MG tablet Take 1 tablet (10 mg total) by mouth daily. 120 tablet 0  . Calcium Citrate-Vitamin D 315-250 MG-UNIT TABS Take by mouth.    . clomiPRAMINE (ANAFRANIL) 25 MG capsule Take 1 capsule (25 mg total) by mouth at bedtime. 120 capsule 0  . ketoconazole (NIZORAL) 2 % cream     . Omega-3 Fatty Acids (FISH OIL) 1000 MG CAPS Take by mouth.    . pantoprazole (PROTONIX) 40 MG tablet Take 1 tablet (40 mg total) by mouth daily. 30 tablet 0  . simethicone (GAS-X) 80 MG chewable tablet Chew 1 tablet (80 mg total) by mouth every 6 (six) hours as needed for flatulence. 30  tablet 0   No current facility-administered medications for this visit.    Musculoskeletal: Strength & Muscle Tone: Telepsych visit-Grossly normal Musculoskeletal and cranial nerve inspections Gait & Station: NA Patient leans: N/A  Psychiatric Specialty Exam: Review of Systems  Constitutional: Negative for activity change, appetite change, chills, diaphoresis, fatigue, fever and unexpected weight change.  Gastrointestinal: Negative for abdominal distention, abdominal pain, anal bleeding, blood in stool, constipation, diarrhea, nausea, rectal pain and vomiting.       Steatohepatitis  Psychiatric/Behavioral: Negative for agitation (Paranoia remitted on meds), behavioral problems, confusion, decreased concentration, dysphoric mood, hallucinations, self-injury, sleep disturbance and suicidal ideas. The patient is not nervous/anxious (OCD remitted onmeds) and is not hyperactive.     There were no vitals taken for this visit.There is no height or weight on file to calculate BMI.WEBEX VISIT  General Appearance: Well Groomed  Eye Contact:  Good  Speech:  Clear and Coherent  Volume:  Normal  Mood:  Euthymic  Affect:  Appropriate and Congruent  Thought Process:  Coherent and Descriptions of Associations: Intact  Orientation:  Full (Time, Place, and Person)  Thought Content: WDL   Suicidal Thoughts:  No  Homicidal Thoughts:  No  Memory:  Negative  Judgement:  Intact  Insight:  Present  Psychomotor Activity:  Normal  Concentration:  Concentration: Good and Attention Span: Good  Recall:  Good  Fund of Knowledge: Good  Language: WDL  Akathisia:  NA  Handed:  Right  AIMS (if indicated): Negative  Assets:  Desire for Improvement Financial Resources/Insurance [place] Talents/Skills Transportation Vocational/Educational  ADL's:  Intact  Cognition: WNL  Sleep:  No complaint   Screenings: AIMS     Office Visit from 09/06/2019 in [place] Office Visit from 07/13/2018 in [place] Office Visit from 01/19/2018 in [place] Office Visit from 10/27/2017 in [place] Admission (Discharged) from 06/02/2017 in [place] Total Score  0  0  0  0  0    AUDIT     Admission (Discharged) from 06/02/2017 in [place] CHILD/[facility]ES [facility]  Alcohol Use Disorder Identification Test Final Score (AUDIT)  0     Assessment : Stable Growing      and Plan: Continue current meds.FU 4 months -sooner if needed   [name], PA-C 12/27/2019, 3:24 PM

## 2020-01-04 DIAGNOSIS — A749 Chlamydial infection, unspecified: Secondary | ICD-10-CM | POA: Insufficient documentation

## 2020-02-12 ENCOUNTER — Other Ambulatory Visit (HOSPITAL_COMMUNITY): Payer: Self-pay | Admitting: Medical

## 2020-04-17 ENCOUNTER — Telehealth (INDEPENDENT_AMBULATORY_CARE_PROVIDER_SITE_OTHER): Payer: 59 | Admitting: Medical

## 2020-04-17 ENCOUNTER — Encounter (HOSPITAL_COMMUNITY): Payer: Self-pay | Admitting: Medical

## 2020-04-17 DIAGNOSIS — Z79899 Other long term (current) drug therapy: Secondary | ICD-10-CM

## 2020-04-17 DIAGNOSIS — F258 Other schizoaffective disorders: Secondary | ICD-10-CM

## 2020-04-17 DIAGNOSIS — E669 Obesity, unspecified: Secondary | ICD-10-CM

## 2020-04-17 DIAGNOSIS — F429 Obsessive-compulsive disorder, unspecified: Secondary | ICD-10-CM

## 2020-04-17 DIAGNOSIS — Z6835 Body mass index (BMI) 35.0-35.9, adult: Secondary | ICD-10-CM

## 2020-04-17 DIAGNOSIS — K7581 Nonalcoholic steatohepatitis (NASH): Secondary | ICD-10-CM

## 2020-04-17 DIAGNOSIS — R7303 Prediabetes: Secondary | ICD-10-CM

## 2020-04-17 MED ORDER — CLOMIPRAMINE HCL 25 MG PO CAPS
25.0000 mg | ORAL_CAPSULE | Freq: Every day | ORAL | 0 refills | Status: DC
Start: 2020-04-17 — End: 2020-11-05

## 2020-04-17 MED ORDER — ARIPIPRAZOLE 10 MG PO TABS
10.0000 mg | ORAL_TABLET | Freq: Every day | ORAL | 0 refills | Status: DC
Start: 2020-04-17 — End: 2020-10-29

## 2020-04-17 NOTE — Progress Notes (Signed)
[place] MD/PA/NP OP Progress Note  04/17/2020 11:14 AM Valerie [name]  MRN:  [id number] Virtual Visit via Video Note  I connected with Valerie [name] on 04/17/20 at 11:00 AM EDT by a video enabled telemedicine application and verified that I am speaking with the correct person using two identifiers. My Chart visit Clinic to pt home  I discussed the limitations of evaluation and management by telemedicine and the availability of in person appointments. The patient expressed understanding and agreed to proceed.   History of Present Illness:See EPIC note    Observations/Objective:See EPIC note   Assessment and Plan:See EPIC note   Follow Up Instructions:See EPIC note    I discussed the assessment and treatment plan with the patient. The patient was provided an opportunity to ask questions and all were answered. The patient agreed with the plan and demonstrated an understanding of the instructions.   The patient was advised to call back or seek an in-person evaluation if the symptoms worsen or if the condition fails to improve as anticipated.  I provided 20 minutes of non-face-to-face time during this encounter.   [name], PA-C   Chief Complaint:  HPI: This is a 4 month FU for [facility] for her schizophrenic/?schizoaffective disorder with subsequent OCD beginning in July 2016. Attempt was made to taper her off antipsychotic meds in March of 2018.influenza August she relapsed while visiting [place] and was restarted on medication GEODON along with Lexapro and Trazodone for sleep. after brief hospitalization.She failed to respond to Geodon and Genesite study was done indicating Lexapro not efficacious and low dose Abilify good. Anafranil was added for OCD and sleep with good response. She continues on these medications and continues to be asymptomatic anf functional. She will return to [employer] in [place] for Aesthetics degree and reports she has gotten her Driver's license.  Visit  Diagnosis:    Past Medical History:  Past Medical History:  Diagnosis Date  . Anxiety   . Depression   . Fatigue   . Headache   . Psychosis ([place]) 06/03/2017   No past surgical history on file.  Family Psychiatric History: see family history  Family History:  Family History reviewed NOVANT  Medical History Relation Name Comments  Stroke Maternal Grandfather    Asthma Maternal Grandmother    Mental illness Maternal Uncle    Diabetes Paternal Grandmother    Hypertension Paternal Grandmother      Social History:  Social History   Socioeconomic History  . Marital status: Single    Spouse name: Not on file  . Number of children: None  . Years of education: [employer]  . Highest education level:   Occupational History  .   Tobacco Use  . Smoking status: Never Smoker  . Smokeless tobacco: Never Used  Vaping Use  . Vaping Use: Never used  Substance and Sexual Activity  . Alcohol use: No  . Drug use: No  . Sexual activity: Not Currently    Birth control/protection: Pill, Abstinence  Other Topics Concern  . Not on file  Social History Narrative  . Not on file   Allergies:  Allergies  Allergen Reactions  . Sulfa Antibiotics Hives and Itching    Hives on face    Metabolic Disorder Labs: Lab Results  Component Value Date   HGBA1C 5.3 06/03/2017   MPG 105.41 06/03/2017   Lab Results  Component Value Date   PROLACTIN 79.1 (H) 06/04/2017   Lab Results  Component Value Date   CHOL  172 (H) 06/04/2017   TRIG 144 06/04/2017   HDL 69 06/04/2017   CHOLHDL 2.5 06/04/2017   VLDL 29 06/04/2017   LDLCALC 74 06/04/2017   Lab Results  Component Value Date   TSH 2.863 06/03/2017    Therapeutic Level Labs:na   Current Medications: Current Outpatient Medications  Medication Sig Dispense Refill  . ARIPiprazole (ABILIFY) 10 MG tablet Take 1 tablet (10 mg total) by mouth daily. 120 tablet 0  . Calcium Citrate-Vitamin D 315-250 MG-UNIT TABS Take by  mouth.    . clomiPRAMINE (ANAFRANIL) 25 MG capsule Take 1 capsule (25 mg total) by mouth at bedtime. 120 capsule 0  . ketoconazole (NIZORAL) 2 % cream     . norgestimate-ethinyl estradiol (ORTHO-CYCLEN) 0.25-35 MG-MCG tablet Take by mouth.    . Omega-3 Fatty Acids (FISH OIL) 1000 MG CAPS Take by mouth.    . pantoprazole (PROTONIX) 40 MG tablet Take 1 tablet (40 mg total) by mouth daily. 30 tablet 0  . simethicone (GAS-X) 80 MG chewable tablet Chew 1 tablet (80 mg total) by mouth every 6 (six) hours as needed for flatulence. 30 tablet 0   No current facility-administered medications for this visit.   Musculoskeletal: Strength & Muscle Tone: Telepsych visit-Grossly normal Musculoskeletal and cranial nerve inspections Gait & Station: NA Patient leans: N/A  Psychiatric Specialty Exam: Review of Systems  Constitutional: Negative for activity change, appetite change, chills, diaphoresis, fatigue, fever and unexpected weight change.  Psychiatric/Behavioral: Negative for agitation, behavioral problems, confusion, decreased concentration, dysphoric mood, hallucinations, self-injury, sleep disturbance and suicidal ideas. The patient is not nervous/anxious and is not hyperactive.     There were no vitals taken for this visit.There is no height or weight on file to calculate BMI.  General Appearance: Well Groomed  Eye Contact:  Good  Speech:  Clear and Coherent and Normal Rate  Volume:  Normal  Mood:  Euthymic  Affect:  Congruent  Thought Process:  Coherent and Descriptions of Associations: Intact  Orientation:  Full (Time, Place, and Person)  Thought Content: WDL and Logical   Suicidal Thoughts:  No  Homicidal Thoughts:  No  Memory:  Negative  Judgement:  Intact  Insight:  Present  Psychomotor Activity:  Normal and lIMITED VIEW  Concentration:  Concentration: Good and Attention Span: Good  Recall:  Negative  Fund of Knowledge: WDL  Language: WDL  Akathisia:  Negative  Handed:  Right   AIMS (if indicated): Televideo visit  Assets:  Desire for Improvement Financial Resources/Insurance Housing Leisure Time Physical Health Resilience Social Support Transportation Vocational/Educational  ADL's:  Intact  Cognition: WNL  Sleep:  Good   Screenings: AIMS     Office Visit from 09/06/2019 in [place] Office Visit from 07/13/2018 in [place] Office Visit from 01/19/2018 in [place] Office Visit from 10/27/2017 in [place] Admission (Discharged) from 06/02/2017 in [place] Total Score 0 0 0 0 0    AUDIT     Admission (Discharged) from 06/02/2017 in [place] CHILD/ADOL[facility] 100B  Alcohol Use Disorder Identification Test Final Score (AUDIT) 0       Assessment and Plan: Stable/Continue Abilify an Anafranil FU 4 months   [name], PA-C 04/17/2020, 11:14 AM

## 2020-08-14 ENCOUNTER — Telehealth (INDEPENDENT_AMBULATORY_CARE_PROVIDER_SITE_OTHER): Payer: 59 | Admitting: Medical

## 2020-08-14 ENCOUNTER — Encounter (HOSPITAL_COMMUNITY): Payer: Self-pay | Admitting: Medical

## 2020-08-14 DIAGNOSIS — E669 Obesity, unspecified: Secondary | ICD-10-CM

## 2020-08-14 DIAGNOSIS — F258 Other schizoaffective disorders: Secondary | ICD-10-CM | POA: Diagnosis not present

## 2020-08-14 DIAGNOSIS — Z79899 Other long term (current) drug therapy: Secondary | ICD-10-CM | POA: Diagnosis not present

## 2020-08-14 DIAGNOSIS — K7581 Nonalcoholic steatohepatitis (NASH): Secondary | ICD-10-CM

## 2020-08-14 DIAGNOSIS — F429 Obsessive-compulsive disorder, unspecified: Secondary | ICD-10-CM

## 2020-08-14 DIAGNOSIS — Z6835 Body mass index (BMI) 35.0-35.9, adult: Secondary | ICD-10-CM

## 2020-08-14 NOTE — Progress Notes (Addendum)
[name] MD/PA/NP OP Progress Note  08/14/2020 11:37 AM Valerie [name]  MRN:  [id number] Virtual Visit via Video Note  I connected with Valerie [name] on 08/14/20 at 11:00 AM EDT by a video enabled telemedicine application and verified that I am speaking with the correct person using two identifiers  My Chart visit from [name] to pt cell phone in private location at school   I discussed the limitations of evaluation and management by telemedicine and the availability of in person appointments. The patient expressed understanding and agreed to proceed.     History of Present Illness:See EPIC note     Observations/Objective:See EPIC note     Assessment and Plan:See EPIC note     Follow Up Instructions:See EPIC note     I discussed the assessment and treatment plan with the patient. The patient was provided an opportunity to ask questions and all were answered. The patient agreed with the plan and demonstrated an understanding of the instructions.   The patient was advised to call back or seek an in-person evaluation if the symptoms worsen or if the condition fails to improve as anticipated.   I provided 25 minutes of non-face-to-face time during this encounter.   [name], PA-C  Chief Complaint:  Chief Complaint    Follow-up; Medication Refill; Paranoid; OCD     HPI: Valerie [name] is seen for a scheduled 4 month FU. She is at school earning degree in [place] and reports she has gotten her driver's license. She has not had any further episodes of paranoia. Her OCD is controlled with Anafranil.  Visit Diagnosis:    ICD-10-CM   1. Other schizoaffective disorders ([place])  F25.8   2. Obsessive-compulsive disorder, unspecified type  F42.9   3. Medication management  Z79.899   4. Class 2 obesity without serious comorbidity with body mass index (BMI) of 35.0 to 35.9 in adult, unspecified obesity type  E66.9    Z68.35   5. Steatohepatitis  K75.81    Past Medical History:  reviewed [facility] Everywhere 07/15/2020 Further Clinical Support [facility] [place] [facility]  [address] [place], [place], [place] [address]   Hepatitis B Vaccination      Past Medical History:  Diagnosis Date  . Anxiety   . Depression   . Fatigue   . Headache   . Psychosis ([name]) 06/03/2017   No past surgical history on file Family Psychiatric History: see family history  Family History:  Family History reviewed NOVANT  Medical History Relation Name Comments  Stroke Maternal Grandfather    Asthma Maternal Grandmother    Mental illness Maternal Uncle    Diabetes Paternal Grandmother    Hypertension Paternal Grandmother      Past Psychiatric History: [facility]IENT [facility] [place] Patient and father noted that 1st episode began in November with a few days of irritability, anxiety, depression, and anger. This resolved. She has had two more severe episodes since then between February and now where she is irritable, short tempered, appears to be responding to internal stimulation, reporting auditory hallucinations, cursing at family. Her grades have dropped from As and Bs to possibly needing Summer school. Followed from 03/20/2015 to 02/24/2017 when she appeared in complete remission INPATIENT Date of Admission: 06/02/2017 Date of Discharge:06/10/2017 Discharge Diagnoses:        Diagnosis Date Noted  . Psychosis [F29] 06/03/2017  . MDD (major depressive disorder), recurrent episode, severe ([place]) [name]     Social History:  Social History   Socioeconomic History  . Marital status: Single    Spouse name: No  . Number of children: No  . Years of education: Currently at [name]  . Highest education level: Seeking associate degree in aethetics  Occupational History  . Not on file  Tobacco Use  . Smoking status: Never Smoker  . Smokeless tobacco: Never Used  Vaping Use  . Vaping Use: Never used   Substance and Sexual Activity  . Alcohol use: No  . Drug use: No  . Sexual activity: Not Currently    Birth control/protection: Pill, Abstinence  Other Topics Concern  . Not on file  Social History Narrative  . Not on file     Allergies:  Allergies  Allergen Reactions  . Sulfa Antibiotics Hives and Itching    Hives on face    Metabolic Disorder Labs: 82/99/37   Ref Range & Units6 mo ago  Glucose65 - 99 JI/RC78  BUN6 - 20 LF/YB01  Creatinine0.57 - 1.00 mg/dL0.58  eGFR If NonAfrican American>59 mL/min/1.73132 eGFR If African American>59 mL/min/1.73152  BUN/Creatinine Ratio9 - 23 19 Sodium134 - 144 mmol/L138  Potassium3.5 - 5.2 mmol/L4.6  Chloride96 - 106 mmol/L101  CO220 - 29 mmol/L19 Low   CALCIUM8.7 - 10.2 mg/dL10.0  Total Protein6.0 - 8.5 g/dL7.5  Albumin, Serum3.9 - 5.0 g/dL4.3  Globulin, Total1.5 - 4.5 g/dL3.2  Albumin/Globulin Ratio1.2 - 2.2 1.3  Total Bilirubin0.0 - 1.2 mg/dL<0.2  Alkaline Phosphatase39 - 117 IU/L29 Low   AST0 - 40 IU/L24  ALT (SGPT)0 - 32 IU/L27    Ref Range & Units 11 mo ago Comments  Hep A IgM Negative  Negative        Hepatitis B Surface Ag Negative  Negative        Hep B Core IgM Ab Negative  Negative        Hepatitis C Virus Ab 0.0 - 0.9 s/co ratio <0.1     09/13/20  Contains abnormal data NASH FibroSURE Specimen:  Blood  Ref Range & Units 11 mo ago Comments  Fibrosis Score 0.00 - 0.21  0.01        Fibrosis Stage   Comment                     F0 - No fibrosis      Steatosis Score 0.00 - 0.30  0.62 High         Steatosis Grade   Comment  S2 - Moderate Steatosis      NASH Score 0.25  0.50 High         NASH Grade   Comment              N1 - Borderline or probable NASH      Height  in 61        WEIGHT:  LBS 185        Alpha-2 Macroglobulins, Qn 110 - 276 mg/dL 194        Haptoglobin 33 - 278 mg/dL 260        Apolipoprotein A-1 116 - 209 mg/dL 214 High         Total Bilirubin 0.0 - 1.2 mg/dL <0.1        GGT 0 - 60  IU/L 78 High         ALT 0 - 40 IU/L 56 High         AST 0 - 40 IU/L 37        Cholesterol,  Total 100 - 199 mg/dL 220 High         Glucose, Serum 65 - 99 mg/dL 97        Triglycerides 0 - 149 mg/dL 323 High         Interpretation    Quantitative results of 10 biochemicals in combination with  age, gender, height, and weight, are analyzed using a  computational algorithm to provide a quantitative surrogate  marker (0.0-1.0) of liver fibrosis (Metavir F0-F4), hepatic  steatosis (0.0-1.0, S0-S3), and Non-Alcoholic Steato-  Hepatitis (NASH) (0.0-0.75, N0-N2). The absence of steatosis  (S<0.38) precludes the diagnosis of NASH.  Fibrosis marker: In a study of 132 Non-Alcoholic Fatty  Liver Disease (NAFLD) patients where 23% had significant  NAFLD fibrosis (Metavir F2-F4) and 11% had cirrhosis by  liver biopsy, a fibrosis result of >0.3 yielded a  sensitivity of 83% and a specificity of 78% for the  detection of significant fibrosis(1).  Steatosis Marker: In a population of 744 patients (583 HCV,  18 HBV, 69 NAFLD, and 74 alcoholic disease patients), where  36% had significant steatosis (>5%) on a liver biopsy, a  steatosis score >0.5 had a sensitivity of 71% and a  specificity of 72% for identification of significant  steatosis(2).  NASH marker: In a population of 257 NAFLD patients, where  62% had at least some NASH by liver biopsy, a prediction of  NASH had a sensitivity of 88% for identifying NASH and a  specificity of 50%(3).   Therapeutic Level Labs:NA   Current Medications: Current Outpatient Medications  Medication Sig Dispense Refill  . ARIPiprazole (ABILIFY) 10 MG tablet Take 1 tablet (10 mg total) by mouth daily. 120 tablet 0  . Calcium Citrate-Vitamin D 315-250 MG-UNIT TABS Take by mouth.    . clomiPRAMINE (ANAFRANIL) 25 MG capsule Take 1 capsule (25 mg total) by mouth at bedtime. 120 capsule 0  . ketoconazole (NIZORAL) 2 % cream     . norgestimate-ethinyl  estradiol (ORTHO-CYCLEN) 0.25-35 MG-MCG tablet Take by mouth.    . Omega-3 Fatty Acids (FISH OIL) 1000 MG CAPS Take by mouth.    . pantoprazole (PROTONIX) 40 MG tablet Take 1 tablet (40 mg total) by mouth daily. 30 tablet 0  . simethicone (GAS-X) 80 MG chewable tablet Chew 1 tablet (80 mg total) by mouth every 6 (six) hours as needed for flatulence. 30 tablet 0   No current facility-administered medications for this visit.     Musculoskeletal: Strength & Muscle Tone: Telepsych visit-Grossly normal Musculoskeletal and cranial nerve inspections Gait & Station: NA Patient leans: N/A  Psychiatric Specialty Exam: Review of Systems  Constitutional: Negative for activity change (In school/appears to be thinner), appetite change, chills, diaphoresis, fatigue, fever and unexpected weight change.  Gastrointestinal: Negative for abdominal distention, abdominal pain, anal bleeding, blood in stool, constipation, diarrhea, nausea, rectal pain and vomiting.       Steatorrhea being followed by [facility]  Neurological: Negative for dizziness, tremors, seizures, syncope, facial asymmetry, speech difficulty, weakness, light-headedness, numbness and headaches.  Psychiatric/Behavioral: Negative for agitation, behavioral problems, confusion, decreased concentration, dysphoric mood, hallucinations, self-injury, sleep disturbance and suicidal ideas. The patient is not nervous/anxious and is not hyperactive.     There were no vitals taken for this visit.There is no height or weight on file to calculate BMI.MYCHART VISIT  General Appearance: Neat and Well Groomed Geophysical data processor on LT ZYGOMA  Eye Contact:  Good  Speech:  Clear and Coherent  Volume:  Normal  Mood:  Euthymic  Affect:  Appropriate and Congruent  Thought Process:  Coherent and Descriptions of Associations: Intact  Orientation:  Full (Time, Place, and Person)  Thought Content: WDL   Suicidal Thoughts:  No  Homicidal Thoughts:  No  Memory:   Negative  Judgement:  Intact  Insight:  Present  Psychomotor Activity:  Normal limited view  Concentration:  Concentration: Good and Attention Span: Good  Recall:  Good  Fund of Knowledge: WDL  Language: WDL  Akathisia:  Negative  Handed:  Right  AIMS (if indicated): Televideo WNL  Assets:  Communication Skills Desire for Improvement Financial Resources/Insurance [place] Talents/Skills Transportation Vocational/Educational  ADL's:  Intact  Cognition: WNL  Sleep:  Negative   Screenings: AIMS     Office Visit from 09/06/2019 in [place] Office Visit from 07/13/2018 in [place] Office Visit from 01/19/2018 in [place] Office Visit from 10/27/2017 in [place] Admission (Discharged) from 06/02/2017 in [place] CHILD/AD[facility]ES [facility]  AIMS Total Score 0 0 0 0 0    AUDIT     Admission (Discharged) from 06/02/2017 in [place] CHILD/ADOL[facility]  Alcohol Use Disorder Identification Test Final Score (AUDIT) 0       Assessment;She remains in remission. Liver being followed and LFTs have returned to normal     and Plan: Will continue her current medications and  see her in 6 months-sooner if needed.Discussed considering taper of medications at that visit.   [name], PA-C 08/14/2020, 11:37 AM

## 2020-10-23 ENCOUNTER — Other Ambulatory Visit (HOSPITAL_COMMUNITY): Payer: Self-pay | Admitting: Medical

## 2020-10-28 ENCOUNTER — Telehealth (HOSPITAL_COMMUNITY): Payer: Self-pay | Admitting: Medical

## 2020-10-28 NOTE — Telephone Encounter (Signed)
PT called. She needs refill on anadranil sent to [facility].

## 2020-10-29 ENCOUNTER — Other Ambulatory Visit (HOSPITAL_COMMUNITY): Payer: Self-pay | Admitting: Medical

## 2020-10-29 MED ORDER — ARIPIPRAZOLE 10 MG PO TABS: 10.0000 mg | ORAL_TABLET | Freq: Every day | ORAL | 0 refills | Status: DC

## 2020-10-29 NOTE — Telephone Encounter (Signed)
Rx sent 

## 2020-10-30 ENCOUNTER — Other Ambulatory Visit (HOSPITAL_COMMUNITY): Payer: Self-pay | Admitting: Medical

## 2020-10-30 ENCOUNTER — Telehealth (HOSPITAL_COMMUNITY): Payer: Self-pay | Admitting: Medical

## 2020-10-30 NOTE — Telephone Encounter (Signed)
Pt needs refill on clomipramine sent to [facility] in [place]

## 2020-10-30 NOTE — Telephone Encounter (Signed)
Rx sent needs to have FU 25mo from last visit if not scheduled Thanks

## 2020-11-04 NOTE — Telephone Encounter (Signed)
This refill did not go thru. [facility] in [place] sent a fax requesting this medication. Please resend.

## 2020-11-05 ENCOUNTER — Other Ambulatory Visit (HOSPITAL_COMMUNITY): Payer: Self-pay | Admitting: Medical

## 2020-11-05 ENCOUNTER — Telehealth (HOSPITAL_COMMUNITY): Payer: Self-pay | Admitting: Medical

## 2020-11-05 MED ORDER — CLOMIPRAMINE HCL 25 MG PO CAPS: 25.0000 mg | ORAL_CAPSULE | Freq: Every day | ORAL | 0 refills | Status: DC

## 2020-11-05 NOTE — Telephone Encounter (Signed)
Am not in On Tuesdays-please text me

## 2020-11-05 NOTE — Progress Notes (Signed)
rx resent  

## 2020-11-05 NOTE — Telephone Encounter (Signed)
Sent again

## 2020-11-05 NOTE — Telephone Encounter (Signed)
Clomipramine  This refill did not go thru. [facility] in [place] sent a fax requesting this medication. Patient and mother has now called requesting the refill as urgent.   Please resend.

## 2020-12-18 NOTE — Progress Notes (Signed)
rx filled

## 2021-02-12 ENCOUNTER — Encounter (HOSPITAL_COMMUNITY): Payer: Self-pay | Admitting: Medical

## 2021-02-12 ENCOUNTER — Telehealth (HOSPITAL_COMMUNITY): Payer: 59 | Admitting: Medical

## 2021-02-12 ENCOUNTER — Telehealth (INDEPENDENT_AMBULATORY_CARE_PROVIDER_SITE_OTHER): Payer: 59 | Admitting: Medical

## 2021-02-12 ENCOUNTER — Telehealth (INDEPENDENT_AMBULATORY_CARE_PROVIDER_SITE_OTHER): Payer: Self-pay | Admitting: Medical

## 2021-02-12 ENCOUNTER — Other Ambulatory Visit: Payer: Self-pay

## 2021-02-12 DIAGNOSIS — E669 Obesity, unspecified: Secondary | ICD-10-CM | POA: Diagnosis not present

## 2021-02-12 DIAGNOSIS — F429 Obsessive-compulsive disorder, unspecified: Secondary | ICD-10-CM

## 2021-02-12 DIAGNOSIS — F258 Other schizoaffective disorders: Secondary | ICD-10-CM

## 2021-02-12 DIAGNOSIS — Z79899 Other long term (current) drug therapy: Secondary | ICD-10-CM

## 2021-02-12 DIAGNOSIS — R7303 Prediabetes: Secondary | ICD-10-CM

## 2021-02-12 DIAGNOSIS — F411 Generalized anxiety disorder: Secondary | ICD-10-CM

## 2021-02-12 DIAGNOSIS — K7581 Nonalcoholic steatohepatitis (NASH): Secondary | ICD-10-CM

## 2021-02-12 DIAGNOSIS — Z6835 Body mass index (BMI) 35.0-35.9, adult: Secondary | ICD-10-CM

## 2021-02-12 MED ORDER — ARIPIPRAZOLE 10 MG PO TABS: 10.0000 mg | ORAL_TABLET | Freq: Every day | ORAL | 1 refills | Status: DC

## 2021-02-12 MED ORDER — CLOMIPRAMINE HCL 25 MG PO CAPS: 25.0000 mg | ORAL_CAPSULE | Freq: Every day | ORAL | 1 refills | Status: DC

## 2021-02-12 NOTE — Progress Notes (Signed)
Patient ID: Valerie [name], female   DOB: Jul 28, 1996, 25 y.o.   MRN: [id number] Attempted to contact thru [place] 11:00 am. Called Cell Left VM Called home spoke with father.Pt to call and rechedule

## 2021-02-12 NOTE — Progress Notes (Signed)
[facility] MD/PA/NP OP Progress Note  02/12/2021 4:14 PM Valerie [name]  MRN:  [id number] Virtual Visit via Telephone Note  I connected with Valerie [name] on 02/12/21 at  2:00 PM EDT by telephone and verified that I am speaking with the correct person using two identifiers.  Location: Patient: Home Cell Provider: [facility]   I discussed the limitations, risks, security and privacy concerns of performing an evaluation and management service by telephone and the availability of in person appointments. I also discussed with the patient that there may be a patient responsible charge related to this service. The patient expressed understanding and agreed to proceed.    History of Present Illness:See EPIC note    Observations/Objective:See EPIC note   Assessment and Plan:See EPIC note   Follow Up Instructions:See EPIC note I discussed the assessment and treatment plan with the patient. The patient was provided an opportunity to ask questions and all were answered. The patient agreed with the plan and demonstrated an understanding of the instructions.   The patient was advised to call back or seek an in-person evaluation if the symptoms worsen or if the condition fails to improve as anticipated.  I provided 20  minutes of non-face-to-face time during this encounter.   [name], PA-C   Chief Complaint:  Chief Complaint    Follow-up; Medication Refill     HPI: This is a 4 month FU for patient with recurrent episodes of Paranoia ?Schizoaffective vs Schizophrenic disorder with subsequent OCD unspecified. She has been without psychotic episodes since Post Hospitalization August 2018 when medications were adjusted to current Abilify and Anafranil. 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 She is having no medication problems.   Visit Diagnosis:    ICD-10-CM   1. Obsessive-compulsive disorder, unspecified type  F42.9   2.  Other schizoaffective disorders ([place])  F25.8   3. Medication management  Z79.899   4. Class 2 obesity without serious comorbidity with body mass index (BMI) of 35.0 to 35.9 in adult, unspecified obesity type  E66.9    Z68.35   5. Prediabetes  R73.03     Past Psychiatric History:  OUTPATIENT 03/20/2015 to Present  [place] Patient and father noted that 1st episode began in November with a few days of irritability, anxiety, depression, and anger. This resolved. She has had two more severe episodes since then between February and now where she is irritable, short tempered, appears to be responding to internal stimulation, reporting auditory hallucinations, cursing at family. Her grades have dropped from As and Bs to possibly needing Summer school. Followed from 03/20/2015 to 02/24/2017 when she appeared in complete remission INPATIENT Date of Admission: 06/02/2017 Date of Discharge:06/10/2017 Discharge Diagnoses:  Diagnosis Date Noted  . Psychosis [F29] 06/03/2017  . MDD (major depressive disorder), recurrent episode, severe ([place]) [name]    Past Medical History:  Care Everywhere:Reviewed no new records today  Past Medical History:  Diagnosis Date  . Anxiety   . Depression   . Fatigue   . Headache   . Psychosis ([place]) 06/03/2017   History reviewed. No pertinent surgical history.  Family Psychiatric History:  see family history  Family History:  Family History reviewed  NOVANT Medical History Relation Name Comments  Stroke Maternal Grandfather    Asthma Maternal Grandmother    Mental illness Maternal Uncle    Diabetes Paternal Grandmother    Hypertension Paternal Grandmother  Social History:  Social History   Socioeconomic History  . Marital status: Single    Spouse name: NA  . Number of children: NoNE  . Years of education: CURRENTLY COMPLETING [employer]  . Highest education level: 3 Mos to complation of Aesthetist  degree  Occupational History  . Not on file  Tobacco Use  . Smoking status: Never Smoker  . Smokeless tobacco: Never Used  Vaping Use  . Vaping Use: Never used  Substance and Sexual Activity  . Alcohol use: No  . Drug use: No  . Sexual activity: Not Currently    Birth control/protection: Pill, Abstinence  Other Topics Concern  . Not on file  Social History Narrative  . Not on file   Social Determinants of Health   Financial Resource Strain: Not on file  Food Insecurity: Not on file  Transportation Needs: Not on file  Physical Activity: Not on file  Stress: Not on file  Social Connections: Not on file    Allergies:  Allergies  Allergen Reactions  . Sulfa Antibiotics Hives and Itching    Hives on face    Metabolic Disorder Labs: Lab Results  Component Value Date   HGBA1C 5.3 06/03/2017   MPG 105.41 06/03/2017   Lab Results  Component Value Date   PROLACTIN 79.1 (H) 06/04/2017   Lab Results  Component Value Date   CHOL 172 (H) 06/04/2017   TRIG 144 06/04/2017   HDL 69 06/04/2017   CHOLHDL 2.5 06/04/2017   VLDL 29 06/04/2017   LDLCALC 74 06/04/2017   Lab Results  Component Value Date   TSH 2.863 06/03/2017    Therapeutic Level Labs:NA  Current Medications: Current Outpatient Medications  Medication Sig Dispense Refill  . ARIPiprazole (ABILIFY) 10 MG tablet Take 1 tablet (10 mg total) by mouth daily. 90 tablet 1  . Calcium Citrate-Vitamin D 315-250 MG-UNIT TABS Take by mouth.    . clomiPRAMINE (ANAFRANIL) 25 MG capsule Take 1 capsule (25 mg total) by mouth at bedtime. 90 capsule 1  . ketoconazole (NIZORAL) 2 % cream     . norgestimate-ethinyl estradiol (ORTHO-CYCLEN) 0.25-35 MG-MCG tablet Take 1 tablet by mouth daily.    . Omega-3 Fatty Acids (FISH OIL) 1000 MG CAPS Take by mouth.    . pantoprazole (PROTONIX) 40 MG tablet Take 1 tablet (40 mg total) by mouth daily. 30 tablet 0  . simethicone (GAS-X) 80 MG chewable tablet Chew 1 tablet (80 mg total) by  mouth every 6 (six) hours as needed for flatulence. 30 tablet 0   No current facility-administered medications for this visit.     Musculoskeletal: Strength & Muscle Tone: Telepsych visit-Grossly normal Musculoskeletal and cranial nerve inspections Gait & Station: NA Patient leans: N/A   Psychiatric Specialty Exam: Review of Systems  Constitutional: Negative for activity change, appetite change, chills, diaphoresis, fatigue, fever and unexpected weight change.  HENT: Negative for congestion, postnasal drip, rhinorrhea, sinus pressure, sinus pain, sneezing and sore throat.   Respiratory: Negative for apnea, cough, choking, chest tightness, shortness of breath, wheezing and stridor.   Neurological: Negative for dizziness, tremors, seizures, syncope, facial asymmetry, speech difficulty, weakness, light-headedness, numbness and headaches.  Hematological: Bruises/bleeds easily.  Psychiatric/Behavioral: Negative for agitation, behavioral problems, confusion, decreased concentration, dysphoric mood, hallucinations, self-injury, sleep disturbance and suicidal ideas. The patient is not nervous/anxious and is not hyperactive.     There were no vitals taken for this visit.There is no height or weight on file to calculate BMI.Telepsych visit  General  Appearance: NA  Eye Contact:  NA  Speech:  Clear and Coherent and Normal Rate  Volume:  Normal  Mood:  Sounds happy  Affect:  NA  Thought Process:  Coherent, Goal Directed and Descriptions of Associations: Intact  Orientation:  Full (Time, Place, and Person)  Thought Content: WDL and Logical   Suicidal Thoughts:  No  Homicidal Thoughts:  No  Memory:  Negative  Judgement:  Intact  Insight:  Present  Psychomotor Activity:  NA  Concentration:  Concentration: Good and Attention Span: Good  Recall:  Good  Fund of Knowledge: WDL  Language: Good  Akathisia:  NA  Handed:  Right  AIMS (if indicated): na  Assets:  Desire for Improvement Financial  Resources/Insurance Housing Intimacy Resilience Social Support Talents/Skills Transportation Vocational/Educational  ADL's:  Intact  Cognition: WNL  Sleep:  Negative   Screenings: [place] Office Visit from 09/06/2019 in [place] Office Visit from 07/13/2018 in [place] [place] Office Visit from 01/19/2018 in [facility] [place] Office Visit from 10/27/2017 in [facility] Admission (Discharged) from 06/02/2017 in [facility] [place] Total Score 0 0 0 0 0    AUDIT   Flowsheet Row Admission (Discharged) from 06/02/2017 in [place] CHILD/[facility]  Alcohol Use Disorder Identification Test Final Score (AUDIT) 0       Assessment and Plan: Stable/Continue Current meds.FU 6 months -sooner if needed Consider lower Abilify dose. Recheck Prolactin level   [name], PA-C 02/12/2021, 4:14 PM

## 2021-04-06 ENCOUNTER — Other Ambulatory Visit (HOSPITAL_COMMUNITY): Payer: Self-pay | Admitting: Medical

## 2021-04-09 ENCOUNTER — Other Ambulatory Visit (HOSPITAL_COMMUNITY): Payer: Self-pay | Admitting: Medical

## 2021-04-09 MED ORDER — CLOMIPRAMINE HCL 25 MG PO CAPS: 25.0000 mg | ORAL_CAPSULE | Freq: Every day | ORAL | 1 refills | Status: DC

## 2021-04-09 MED ORDER — CLOMIPRAMINE HCL 25 MG PO CAPS: 25.0000 mg | ORAL_CAPSULE | Freq: Every day | ORAL | 0 refills | Status: DC

## 2021-04-09 NOTE — Progress Notes (Signed)
RX issues addressed

## 2021-07-30 ENCOUNTER — Encounter (HOSPITAL_COMMUNITY): Payer: Self-pay | Admitting: Medical

## 2021-07-30 ENCOUNTER — Other Ambulatory Visit: Payer: Self-pay

## 2021-07-30 ENCOUNTER — Telehealth (HOSPITAL_BASED_OUTPATIENT_CLINIC_OR_DEPARTMENT_OTHER): Payer: 59 | Admitting: Medical

## 2021-07-30 DIAGNOSIS — F258 Other schizoaffective disorders: Secondary | ICD-10-CM

## 2021-07-30 DIAGNOSIS — F429 Obsessive-compulsive disorder, unspecified: Secondary | ICD-10-CM

## 2021-07-30 DIAGNOSIS — K7581 Nonalcoholic steatohepatitis (NASH): Secondary | ICD-10-CM

## 2021-07-30 DIAGNOSIS — Z79899 Other long term (current) drug therapy: Secondary | ICD-10-CM | POA: Diagnosis not present

## 2021-07-30 MED ORDER — ARIPIPRAZOLE 5 MG PO TABS: 5.0000 mg | ORAL_TABLET | Freq: Every day | ORAL | 1 refills | Status: DC

## 2021-07-30 MED ORDER — CLOMIPRAMINE HCL 25 MG PO CAPS: 25.0000 mg | ORAL_CAPSULE | Freq: Every day | ORAL | 0 refills | Status: DC

## 2021-07-30 NOTE — Progress Notes (Signed)
[place] MD/PA/NP OP Progress Note  10:19 AM 07/30/2021 Valerie [name]  MRN:  [id number] Virtual Visit via Video Note  I connected with Valerie [name] on 08/26/21 at 10:00 AM EDT by a video enabled telemedicine application and verified that I am speaking with the correct person using two identifiers.  Location: Patient: At home Provider: [facility] OP [facility] Santiago   I discussed the limitations of evaluation and management by telemedicine and the availability of in person appointments. The patient expressed understanding and agreed to proceed.   History of Present Illness:See EPIC note    Observations/Objective:See EPIC note   Assessment and Plan:See EPIC note   Follow Up Instructions:See EPIC note    I discussed the assessment and treatment plan with the patient. The patient was provided an opportunity to ask questions and all were answered. The patient agreed with the plan and demonstrated an understanding of the instructions.   The patient was advised to call back or seek an in-person evaluation if the symptoms worsen or if the condition fails to improve as anticipated.  I provided 20 minutes of non-face-to-face time during this encounter.   [name], PA-C   Chief Complaint:  Chief Complaint   Follow-up; Medication Refill; OCD; Schizoaffective disorder    HPI: Valerie [name] returns for scheduled FU and Med management of her OCD and Schizoaffective DO. OCD is controlled with Anafranil. 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 She has had not symptoms of psychosis over 4 years now.She continue to live at home. She voices b no complaints /concerns. She is willing to try a lower dose of Abilify.  Visit Diagnosis:    ICD-10-CM   1. Obsessive-compulsive disorder, unspecified type  F42.9     2. Other schizoaffective disorders ([place])  F25.8     3. Medication management  Z79.899     4. Steatohepatitis  K75.81        Past Psychiatric History:  OUTPATIENT 03/20/2015 to Present  [place] Patient and father noted that 1st episode began in November with a few days of irritability, anxiety, depression, and anger. This resolved.  She has had two more severe episodes since then between February and now where she is irritable, short tempered, appears to be responding to internal stimulation, reporting auditory hallucinations, cursing at family. Her grades have dropped from As and Bs to possibly needing Summer school.  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    Past Medical History:  Care [facility] Reason for Visit -  Reason Comments  Gynecologic Exam     Encounter Details  Encounter Details Date Type Department Care Team Description  02/17/2021 Annual Physical [place]   [address]   [place], [place] [address]   [phone]   Ander Purpura   [address]   [place], [place] [address]   [phone] (Work)   [phone] (Fax)   Nutritional therapist for gyn exam (general) (routine) w abnormal findings (Primary Dx);  Encounter for surveillance of contraceptive pills;  Screen for STD (sexually transmitted disease);  [place] intertrigo   Office Visit 04/09/2021 [facility] Reason for Visit Elevated Hepatic Enzymes Discussion and Summary: Her weight  has remained stable and her liver associated enzymes have normalized. I advised her to continue diet and exercise with a goal of better glycemic control and weight loss. We will have her follow-up as needed. If need be we can repeat immunization for hepatitis B.  Past Medical History:   Diagnosis Date   Anxiety    Depression    Fatigue    Headache    Psychosis ([place]) 06/03/2017   History reviewed. No pertinent surgical history.  Family Psychiatric History:  see family history Family History:  Family History reviewed  NOVANT Medical History Relation Name Comments  Stroke Maternal Grandfather      Asthma Maternal Grandmother      Mental illness Maternal Uncle      Diabetes Paternal Grandmother      Hypertension Paternal Grandmother     Social History:  Social History   Socioeconomic History   Marital status: Single    Spouse name: Not on file   Number of children: Not on file   Years of education: Not on file   Highest education level: Not on file  Occupational History   Not on file  Tobacco Use   Smoking status: Never   Smokeless tobacco: Never  Vaping Use   Vaping Use: Never used  Substance and Sexual Activity   Alcohol use: No   Drug use: No   Sexual activity: Not Currently    Birth control/protection: Pill, Abstinence  Other Topics Concern   Not on file  Social History Narrative   Not on file   Social Determinants of Health   Financial Resource Strain: Not on file  Food Insecurity: Not on file  Transportation Needs: Not on file  Physical Activity: Not on file  Stress: Not on file  Social Connections: Not on file    Allergies:  Allergies  Allergen Reactions   Sulfa Antibiotics Hives and Itching    Hives on face    Metabolic Disorder Labs: Lab Results  Component Value Date   HGBA1C 5.3 06/03/2017   MPG 105.41 06/03/2017   Lab Results  Component Value Date   PROLACTIN 79.1 (H) 06/04/2017   Lab Results  Component Value Date   CHOL 172 (H) 06/04/2017   TRIG 144 06/04/2017   HDL 69 06/04/2017   CHOLHDL 2.5 06/04/2017   VLDL 29 06/04/2017   LDLCALC 74 06/04/2017   Lab Results  Component Value Date   TSH 2.863 06/03/2017    Therapeutic Level Labs: No results found for: LITHIUM No results found for: VALPROATE No  components found for:  CBMZ  Current Medications: Current Outpatient Medications  Medication Sig Dispense Refill   ARIPiprazole (ABILIFY) 5 MG tablet Take 1 tablet (5 mg total) by mouth daily. 30 tablet 1   Calcium Citrate-Vitamin D 315-250 MG-UNIT TABS Take by mouth.     clomiPRAMINE (ANAFRANIL) 25 MG capsule Take 1 capsule (25 mg total) by mouth at bedtime. 90 capsule 0   ketoconazole (NIZORAL) 2 % cream      norgestimate-ethinyl estradiol (ORTHO-CYCLEN) 0.25-35 MG-MCG tablet Take 1 tablet by mouth daily.     Omega-3 Fatty Acids (FISH OIL) 1000 MG CAPS Take by mouth.     pantoprazole (PROTONIX) 40 MG tablet Take 1 tablet (40 mg total) by mouth daily. 30 tablet 0   simethicone (GAS-X) 80 MG chewable tablet Chew 1 tablet (80 mg total) by mouth every 6 (six) hours as needed for flatulence. 30 tablet 0   No current facility-administered medications for this  visit.     Musculoskeletal: Strength & Muscle Tone: Telepsych visit-Grossly normal Musculoskeletal and cranial nerve inspections Gait & Station: NA Patient leans: N/A  Psychiatric Specialty Exam: Review of Systems  Constitutional:  Negative for activity change, appetite change, chills, diaphoresis, fatigue, fever and unexpected weight change.  HENT:  Negative for congestion, postnasal drip, rhinorrhea, sinus pressure, sinus pain, sneezing, sore throat and tinnitus.   Respiratory:  Negative for apnea, cough, choking, chest tightness, shortness of breath, wheezing and stridor.   Cardiovascular:  Negative for chest pain, palpitations and leg swelling.  Genitourinary:        Prescriptions Prescription Sig Dispensed Refills Start Date End Date norgestimate-ethinyl estradiol ([place] 28) 0.25-35 mg-mcg per tablet           Psychiatric/Behavioral:  Negative for agitation, behavioral problems, confusion, decreased concentration, dysphoric mood, hallucinations, self-injury, sleep disturbance and suicidal ideas. The patient is not  nervous/anxious and is not hyperactive.    There were no vitals taken for this visit.There is no height or weight on file to calculate BMI.My Chart Visit  General Appearance: Neat and Well Groomed  Eye Contact:  Good  Speech:  Clear and Coherent and Normal Rate  Volume:  Normal  Mood:  Euthymic  Affect:  Appropriate and Congruent  Thought Process:  Coherent and Descriptions of Associations: Intact  Orientation:  Full (Time, Place, and Person)  Thought Content: WDL and Logical   Suicidal Thoughts:  No  Homicidal Thoughts:  No  Memory:   Intact  Judgement:  Intact  Insight:  Present  Psychomotor Activity:  Normal (limited view)  Concentration:  Concentration: Good and Attention Span: Good  Recall:  Negative  Fund of Knowledge:  WDL  Language: Good  Akathisia:  Negative  Handed:  Right  AIMS (if indicated): NA  Assets:  Communication Skills Desire for Improvement Financial Resources/Insurance [place] Talents/Skills Transportation Vocational/Educational  ADL's:  Intact  Cognition: WNL  Sleep:  Negative   Screenings: [facility] [place] Office Visit from 09/06/2019 in [place] Office Visit from 07/13/2018 in [place] Office Visit from 01/19/2018 in [place] Office Visit from 10/27/2017 in [place] Admission (Discharged) from 06/02/2017 in [place] Total Score 0 0 0 0 0      AUDIT    Flowsheet Row Admission (Discharged) from 06/02/2017 in [place] CHILD/ADOLES 100B  Alcohol Use Disorder Identification Test Final Score (AUDIT) 0        Assessment Stable.In remisssion on medications. ? Pychosis resolved?   and Plan:  Continue Anafranil at 25 mg HS Decrease Abilify to 55m FU 4-6 weekssooner if  needed   CDarlyne Russian PA-C 10:19 AM 07/30/2021

## 2021-09-10 ENCOUNTER — Other Ambulatory Visit: Payer: Self-pay

## 2021-09-10 ENCOUNTER — Encounter (HOSPITAL_COMMUNITY): Payer: Self-pay | Admitting: Medical

## 2021-09-10 ENCOUNTER — Telehealth (HOSPITAL_BASED_OUTPATIENT_CLINIC_OR_DEPARTMENT_OTHER): Payer: 59 | Admitting: Medical

## 2021-09-10 DIAGNOSIS — Z79899 Other long term (current) drug therapy: Secondary | ICD-10-CM

## 2021-09-10 DIAGNOSIS — F429 Obsessive-compulsive disorder, unspecified: Secondary | ICD-10-CM | POA: Diagnosis not present

## 2021-09-10 DIAGNOSIS — R7303 Prediabetes: Secondary | ICD-10-CM

## 2021-09-10 DIAGNOSIS — Z6835 Body mass index (BMI) 35.0-35.9, adult: Secondary | ICD-10-CM

## 2021-09-10 DIAGNOSIS — E669 Obesity, unspecified: Secondary | ICD-10-CM

## 2021-09-10 DIAGNOSIS — F258 Other schizoaffective disorders: Secondary | ICD-10-CM | POA: Diagnosis not present

## 2021-09-10 DIAGNOSIS — K7581 Nonalcoholic steatohepatitis (NASH): Secondary | ICD-10-CM

## 2021-09-10 NOTE — Progress Notes (Signed)
[place] [facility] MD/PA/NP OP Progress Note  09/10/2021 4:05 PM Valerie [name]  MRN:  [id number] Virtual Visit via Video Note  I connected with Valerie [name] on 09/10/21 at  4:00 PM EST by a video enabled telemedicine application and verified that I am speaking with the correct person using two identifiers.  Location: Patient: Home Provider: [place]   I discussed the limitations of evaluation and management by telemedicine and the availability of in person appointments. The patient expressed understanding and agreed to proceed.   History of Present Illness:See EPIC note    Observations/Objective:See EPIC note   Assessment and Plan:See EPIC note   Follow Up Instructions:See EPIC note   I discussed the assessment and treatment plan with the patient. The patient was provided an opportunity to ask questions and all were answered. The patient agreed with the plan and demonstrated an understanding of the instructions.   The patient was advised to call back or seek an in-person evaluation if the symptoms worsen or if the condition fails to improve as anticipated.  I provided 20 minutes of non-face-to-face time during this encounter.   [name], PA-C   Chief Complaint:  HPI: Valerie [name] continues to do well 6 weeks after reducing her abilfy to 5 mg.. She continues on break from school. She reports she has gotten her driver's license. ROS is negative  Visit Diagnosis:    ICD-10-CM   1. Obsessive-compulsive disorder, unspecified type  F42.9     2. Other schizoaffective disorders ([place])  F25.8     3. Medication management  Z79.899     4. Steatohepatitis  K75.81     5. Class 2 obesity without serious comorbidity with body mass index (BMI) of 35.0 to 35.9 in adult, unspecified obesity type  E66.9    Z68.35     6. Prediabetes  R73.03       Past Psychiatric History:  OUTPATIENT 03/20/2015 to Present  [place] Patient and father noted that 1st  episode began in November with a few days of irritability, anxiety, depression, and anger. This resolved.  She has had two more severe episodes since then between February and now where she is irritable, short tempered, appears to be responding to internal stimulation, reporting auditory hallucinations, cursing at family. Her grades have dropped from As and Bs to possibly needing Summer school.  Followed from 03/20/2015 to 02/24/2017 when she appeared in complete remission INPATIENT Date of Admission:  06/02/2017 Date of Discharge: 06/10/2017 Discharge Diagnoses:   Diagnosis Date Noted   Psychosis [F29] 06/03/2017   MDD (major depressive disorder), recurrent episode, severe ([place]) [V69.       Past Medical History:  Past Medical History:  Diagnosis Date   Anxiety    Depression    Fatigue    Headache    Psychosis ([place]) 06/03/2017  NOVANT Obsessive-compulsive disorder, unspecified type;  Schizoaffective disorder, unspecified type (*);  Gastroesophageal reflux disease, unspecified whether esophagitis present;  Chronic pain of both feet;  Chronic bilateral thoracic back pain;  Class 2 obesity due to excess calories without serious comorbidity with body mass index (BMI) of 35.0 to 35.9 in adult;  Acanthosis nigricans;  Vitamin D deficiency   [place] - 09/07/2021 11:34 AM EST Reason for Visit Reason Comments  Establish Care         Formatting of this note might be different from the original. Spoke with the Patient today to discuss appointments and screenings due  for the upcoming year.          Comments: Patient declined assistance with scheduling appointments. Patient will make an appointment with a PCP through her mother. Electronically signed by [name] at 09/07/2021 11:36 AM EST Authored by: [name] on: Nov. 14, 2022 11:36 AM,Source organization: [facility] Visit 04/09/2021 [facility] [place] Reason  for Visit Elevated Hepatic Enzymes Discussion and Summary: Her weight has remained stable and her liver associated enzymes have normalized. I advised her to continue diet and exercise with a goal of better glycemic control and weight loss. We will have her follow-up as needed. If need be we can repeat immunization for hepatitis B  No past surgical history on file.  Family Psychiatric History:  see family history  Family History:  Family History reviewed  NOVANT Medical History Relation Name Comments  Stroke Maternal Grandfather      Asthma Maternal Grandmother      Mental illness Maternal Uncle      Diabetes Paternal Grandmother      Hypertension Paternal Grandmother         Social History:  Social History   Socioeconomic History   Marital status: Single    Spouse name: Not on file   Number of children: Not on file   Years of education: 13   Highest education level: Completing Aesthetics Course at [employer]  Occupational History   Student  Tobacco Use   Smoking status: Never   Smokeless tobacco: Never  Vaping Use   Vaping Use: Never used  Substance and Sexual Activity   Alcohol use: No   Drug use: No   Sexual activity: Not Currently    Birth control/protection: Pill, Abstinence  Other Topics Concern   Not on file  Social History Narrative   Not on file   Social Determinants of Health   Financial Resource Strain: Not on file  Food Insecurity: Not on file  Transportation Needs: Not on file  Physical Activity: Not on file  Stress: Not on file  Social Connections: Not on file    Allergies:  Allergies  Allergen Reactions   Sulfa Antibiotics Hives and Itching    Hives on face    Metabolic Disorder Labs: Lab Results  Component Value Date   HGBA1C 5.3 06/03/2017   MPG 105.41 06/03/2017   Lab Results  Component Value Date   PROLACTIN 79.1 (H) 06/04/2017   Lab Results  Component Value Date   CHOL 172 (H) 06/04/2017   TRIG 144 06/04/2017   HDL  69 06/04/2017   CHOLHDL 2.5 06/04/2017   VLDL 29 06/04/2017   LDLCALC 74 06/04/2017   Lab Results  Component Value Date   TSH 2.863 06/03/2017    Therapeutic Level Labs:NA No results found for: LITHIUM No results found for: VALPROATE No components found for:  CBMZ  Current Medications: Current Outpatient Medications  Medication Sig Dispense Refill   ARIPiprazole (ABILIFY) 5 MG tablet Take 1 tablet (5 mg total) by mouth daily. 30 tablet 1   Calcium Citrate-Vitamin D 315-250 MG-UNIT TABS Take by mouth.     clomiPRAMINE (ANAFRANIL) 25 MG capsule Take 1 capsule (25 mg total) by mouth at bedtime. 90 capsule 0   ketoconazole (NIZORAL) 2 % cream      norgestimate-ethinyl estradiol (ORTHO-CYCLEN) 0.25-35 MG-MCG tablet Take 1 tablet by mouth daily.     Omega-3 Fatty Acids (FISH OIL) 1000 MG CAPS Take by mouth.     pantoprazole (PROTONIX)  40 MG tablet Take 1 tablet (40 mg total) by mouth daily. 30 tablet 0   simethicone (GAS-X) 80 MG chewable tablet Chew 1 tablet (80 mg total) by mouth every 6 (six) hours as needed for flatulence. 30 tablet 0   No current facility-administered medications for this visit.    Musculoskeletal: Strength & Muscle Tone: Telepsych visit-Grossly normal Musculoskeletal and cranial nerve inspections Gait & Station: NA Patient leans: N/A   Psychiatric Specialty Exam: Review of Systems  Constitutional:  Negative for activity change, appetite change, chills, diaphoresis, fatigue, fever and unexpected weight change.  HENT: Negative.    Neurological:  Negative for dizziness, tremors, seizures, syncope, facial asymmetry, speech difficulty, weakness, light-headedness, numbness and headaches.  Psychiatric/Behavioral:  Negative for agitation, behavioral problems, confusion, decreased concentration, dysphoric mood, hallucinations, self-injury, sleep disturbance and suicidal ideas. The patient is not nervous/anxious and is not hyperactive.    There were no vitals taken for  this visit.There is no height or weight on file to calculate BMI.  General Appearance: Casual and Neat  Eye Contact:  Good  Speech:  Clear and Coherent  Volume:  Normal  Mood:  Euthymic  Affect:  Congruent  Thought Process:  Coherent and Descriptions of Associations: Intact  Orientation:  Full (Time, Place, and Person)  Thought Content: WDL   Suicidal Thoughts:  No  Homicidal Thoughts:  No  Memory:  Negative  Judgement:  Intact  Insight:  Present  Psychomotor Activity:  Normal  Concentration:  Concentration: Good and Attention Span: Good  Recall:   Intact  Fund of Knowledge:  WDL  Language: Good  Akathisia:  Negative  Handed:  Right  AIMS (if indicated): NEGATIVE  Assets:  Desire for Improvement Financial Resources/Insurance Housing Resilience Social Support Talents/Skills Transportation Vocational/Educational  ADL's:  Intact  Cognition: WNL  Sleep:  Negative   Screenings: [facility] Office Visit from 09/06/2019 in [place] Office Visit from 07/13/2018 in [facility] Office Visit from 01/19/2018 in [facility] Office Visit from 10/27/2017 in [facility] Admission (Discharged) from 06/02/2017 in [facility] Total Score 0 0 0 0 0      AUDIT    Flowsheet Row Admission (Discharged) from 06/02/2017 in [facility] CHILD/ADOLES 100B  Alcohol Use Disorder Identification Test Final Score (AUDIT) 0        Assessment :Continues to tolerate decreasing dose of antipsychotic.No OCD symptoms with Anafranil   and Plan: Continue meds including Abilify 58m 3 mos and FU then.Discussed possibility of stopping Abilify? FU 3 mos-sooner if needed   CDarlyne Russian PA-C 09/10/2021, 4:05 PM

## 2021-09-27 ENCOUNTER — Other Ambulatory Visit (HOSPITAL_COMMUNITY): Payer: Self-pay | Admitting: Medical

## 2021-10-07 MED ORDER — ARIPIPRAZOLE 5 MG PO TABS: 5.0000 mg | ORAL_TABLET | Freq: Every day | ORAL | 2 refills | Status: DC

## 2021-11-09 ENCOUNTER — Telehealth (HOSPITAL_COMMUNITY): Payer: Self-pay | Admitting: *Deleted

## 2021-11-09 ENCOUNTER — Other Ambulatory Visit (HOSPITAL_COMMUNITY): Payer: Self-pay | Admitting: *Deleted

## 2021-11-09 MED ORDER — ARIPIPRAZOLE 10 MG PO TABS: 10.0000 mg | ORAL_TABLET | Freq: Every day | ORAL | 0 refills | Status: DC

## 2021-11-09 MED ORDER — ALPRAZOLAM 1 MG PO TABS: ORAL_TABLET | ORAL | 0 refills | Status: DC

## 2021-11-09 NOTE — Telephone Encounter (Addendum)
Received message from pt's father Valerie [name] this morning concerning pt's recent behavior. Valerie [name] states that pt is not sleeping due to increase in auditory hallucinations and paranoia. Father states that they are "up all night taking care of her" and would like some direction from you. Pt next appointment is scheduled for 12/09/21. Please review and advise. Thanks.   On 11/10/2021 I contacted father by phone. Xanax worked to put her to sleep and quell her agitation on awakening.She was also cleared metally in terms of hallucinosis and paranoia with return to 10 mg dose of Abilify. Father inquired about daughter's desire to have marriage and children.Informed with proper care she should be able to.Will make her an appt for 11/19/21 -He will call sooner if needed

## 2021-11-16 ENCOUNTER — Telehealth (HOSPITAL_COMMUNITY): Payer: Self-pay | Admitting: *Deleted

## 2021-11-16 ENCOUNTER — Other Ambulatory Visit (HOSPITAL_COMMUNITY): Payer: Self-pay | Admitting: Medical

## 2021-11-16 DIAGNOSIS — Z79899 Other long term (current) drug therapy: Secondary | ICD-10-CM

## 2021-11-16 DIAGNOSIS — F429 Obsessive-compulsive disorder, unspecified: Secondary | ICD-10-CM

## 2021-11-16 DIAGNOSIS — F258 Other schizoaffective disorders: Secondary | ICD-10-CM

## 2021-11-16 MED ORDER — TRAZODONE HCL 50 MG PO TABS: 50.0000 mg | ORAL_TABLET | Freq: Every evening | ORAL | 1 refills | Status: DC | PRN

## 2021-11-16 MED ORDER — ARIPIPRAZOLE 20 MG PO TABS
20.0000 mg | ORAL_TABLET | Freq: Every day | ORAL | 0 refills | Status: DC
Start: 2021-11-16 — End: 2021-11-19

## 2021-11-16 MED ORDER — LORAZEPAM 2 MG PO TABS: 2.0000 mg | ORAL_TABLET | Freq: Three times a day (TID) | ORAL | 0 refills | Status: DC | PRN

## 2021-11-16 NOTE — Progress Notes (Signed)
Patient ID: Valerie [name], female   DOB: 1996-04-01, 26 y.o.   MRN: [id number] See Phone call from Mother-pt not sleeping again paranoia-had breif response to initial changes. No furthes mention of self harm activity/behaviors.Mom recalls BID dosing of Abilify.  Will adjust medications -pt has appt Thursday Abilify 10 mg BID  Lorazepam 2 mg TID PRN (1 at bedtime) Trazadone 50 mg HS with repeat PRN Has appt Thursday Mom to call if any problems/worsenimng. May need admission for medication stabilization?

## 2021-11-16 NOTE — Telephone Encounter (Signed)
Pt's mother, Peter Congo [phone], called concerned that pt is not sleeping now. Pt next appointment with you upcoming on 11/19/21. Please review.

## 2021-11-19 ENCOUNTER — Encounter (HOSPITAL_COMMUNITY): Payer: Self-pay | Admitting: Medical

## 2021-11-19 ENCOUNTER — Other Ambulatory Visit: Payer: Self-pay

## 2021-11-19 ENCOUNTER — Telehealth (HOSPITAL_BASED_OUTPATIENT_CLINIC_OR_DEPARTMENT_OTHER): Payer: 59 | Admitting: Medical

## 2021-11-19 ENCOUNTER — Telehealth (HOSPITAL_COMMUNITY): Payer: Self-pay

## 2021-11-19 DIAGNOSIS — F429 Obsessive-compulsive disorder, unspecified: Secondary | ICD-10-CM | POA: Diagnosis not present

## 2021-11-19 DIAGNOSIS — Z6835 Body mass index (BMI) 35.0-35.9, adult: Secondary | ICD-10-CM

## 2021-11-19 DIAGNOSIS — F258 Other schizoaffective disorders: Secondary | ICD-10-CM | POA: Diagnosis not present

## 2021-11-19 DIAGNOSIS — F411 Generalized anxiety disorder: Secondary | ICD-10-CM

## 2021-11-19 DIAGNOSIS — Z79899 Other long term (current) drug therapy: Secondary | ICD-10-CM | POA: Diagnosis not present

## 2021-11-19 DIAGNOSIS — K7581 Nonalcoholic steatohepatitis (NASH): Secondary | ICD-10-CM

## 2021-11-19 DIAGNOSIS — E669 Obesity, unspecified: Secondary | ICD-10-CM

## 2021-11-19 DIAGNOSIS — R7303 Prediabetes: Secondary | ICD-10-CM

## 2021-11-19 MED ORDER — LORAZEPAM 2 MG PO TABS: 2.0000 mg | ORAL_TABLET | Freq: Three times a day (TID) | ORAL | 0 refills | Status: DC | PRN

## 2021-11-19 MED ORDER — ARIPIPRAZOLE 10 MG PO TABS: 10.0000 mg | ORAL_TABLET | Freq: Two times a day (BID) | ORAL | 2 refills | Status: DC

## 2021-11-19 MED ORDER — ARIPIPRAZOLE 10 MG PO TABS: 10.0000 mg | ORAL_TABLET | Freq: Every day | ORAL | 2 refills | Status: DC

## 2021-11-19 NOTE — Telephone Encounter (Signed)
[facility] on [address] in [place] returned your call. They stated that all they have is the patient's Lorazepam 30m. They stated that they're not sure what script was sent in incorrectly. Their number is [phone] Please review and advise. Thank you

## 2021-11-19 NOTE — Progress Notes (Signed)
[place] MD/PA/NP OP Progress Note  11/19/2021 4:37 PM Valerie [name]  MRN:  [id number] Virtual Visit via Video Note  I connected with Valerie [name] on 11/19/21 at  3:30 PM EST by a video enabled telemedicine application and verified that I am speaking with the correct person using two identifiers.  Location: Patient: At home with Mom Provider: [place]   I discussed the limitations of evaluation and management by telemedicine and the availability of in person appointments. The patient expressed understanding and agreed to proceed.   History of Present Illness:See EPIC note    Observations/Objective:See EPIC note   Assessment and Plan:See EPIC note   Follow Up Instructions:See EPIC note    I discussed the assessment and treatment plan with the patient. The patient was provided an opportunity to ask questions and all were answered. The patient agreed with the plan and demonstrated an understanding of the instructions.   The patient was advised to call back or seek an in-person evaluation if the symptoms worsen or if the condition fails to improve as anticipated.  I provided 20 minutes of non-face-to-face time during this encounter.   [name], PA-C   Chief Complaint:  Chief Complaint   Follow-up; Medication mangement; Schizophrenia; Anxiety    HPI: Valerie [name] is seen today for FU on recurrence of her schizoaffective/Schizophrenia psychotic disorder.This episode is characterized by insomnia;paranoia;auditoty hallucinations and bizzare behaviors. She is showing improvement with increase in her Abilify and benzodiaepene therapy Xanax initially  now Ativan. Originaaly thought her relapse was due to attempt to taper Abilify but today she admits to not taking her medication.Mom reports she had gotten into giving meds to her to take but not checking to see if she took it.  Visit Diagnosis:    ICD-10-CM   1. Other schizoaffective disorders ([place])  F25.8     2. Obsessive-compulsive  disorder, unspecified type  F42.9     3. Medication management  Z79.899     4. Steatohepatitis  K75.81     5. Class 2 obesity without serious comorbidity with body mass index (BMI) of 35.0 to 35.9 in adult, unspecified obesity type  E66.9    Z68.35     6. Prediabetes  R73.03     7. GAD (generalized anxiety disorder)  F41.1       Past Psychiatric History:  Patient ID: Valerie [name], female   DOB: [birth year], 26 y.o.   MRN: [id number] See Phone call from Mother-pt not sleeping again paranoia-had breif response to initial changes. No furthes mention of self harm activity/behaviors.Mom recalls BID dosing of Abilify.  Will adjust medications -pt has appt Thursday Abilify 10 mg BID  Lorazepam 2 mg TID PRN (1 at bedtime) Trazadone 50 mg HS with repeat PRN Has appt Thursday Mom to call if any problems/worsenimng. May need admission for medication stabilization?  OUTPATIENT 03/20/2015 to Present  [place] Patient and father noted that 1st episode began in November with a few days of irritability, anxiety, depression, and anger. This resolved.  She has had two more severe episodes since then between February and now where she is irritable, short tempered, appears to be responding to internal stimulation, reporting auditory hallucinations, cursing at family. Her grades have dropped from As and Bs to possibly needing Summer school.  Followed from 03/20/2015 to 02/24/2017 when she appeared in complete remission INPATIENT Date of Admission:  06/02/2017 Date of Discharge: 06/10/2017 Discharge Diagnoses:   Diagnosis Date Noted   Psychosis [F29] 06/03/2017  MDD (major depressive disorder), recurrent episode, severe ([place]) [name]       Past Medical History:  Past Medical History:  Diagnosis Date   Anxiety    Depression    Fatigue    Headache    Psychosis ([place]) 06/03/2017   History reviewed. No pertinent surgical history.  Family Psychiatric History:   NOVANT Mental illness Maternal Uncle   Family History:  Family History reviewed  NOVANT Medical History Relation Name Comments  Stroke Maternal Grandfather      Asthma Maternal Grandmother      Mental illness Maternal Uncle      Diabetes Paternal Grandmother      Hypertension Paternal Grandmother         Social History:  Social History         Socioeconomic History   Marital status: Single      Spouse name: Not on file   Number of children: Not on file   Years of education: 13   Highest education level: Completing Aesthetics Course at [employer]  Occupational History   Student  Tobacco Use   Smoking status: Never   Smokeless tobacco: Never  Vaping Use   Vaping Use: Never used  Substance and Sexual Activity   Alcohol use: No   Drug use: No   Sexual activity: Not Currently      Birth control/protection: Pill, Abstinence  Other Topics Concern   Not on file  Social History Narrative   Not on file      Allergies:  Allergies  Allergen Reactions   Sulfa Antibiotics Hives and Itching    Hives on face    Metabolic Disorder Labs: DIABETES [facility] 11/19/2021 Component    Glucose  Hemoglobin A1c  Glucose, UA   Component 11/19/2021 09/24/2021 09/24/2021 01/18/2020          Glucose 84 89 -- 87 Load older lab results  Hemoglobin A1c -- -- 5.6 -- Load older lab results  Glucose, UA -- --      Lab Results  Component Value Date   PROLACTIN 79.1 (H) 06/04/2017   [place] 09/24/2021 Component    Cholesterol, Total  Triglycerides  HDL  VLDL Cholesterol Cal  LDL  Apolipoprotein A-1  LDL/HDL Ratio   Component 09/24/2021 09/14/2019        Cholesterol, Total 183 220 High    Load older lab results  Triglycerides 164 High    323 High    Load older lab results  HDL 65 -- Load older lab results  VLDL Cholesterol Cal 28 -- Load older lab results  LDL 90 -- Load older lab results  Apolipoprotein A-1 -- 214 High       LDL/HDL Ratio 1.4 --    [facility] 09/24/2021 Component    TSH  Free T4   Component 09/24/2021       TSH 1.890 Load older lab results  Free T4 1.19    Therapeutic Level Labs:NA  Current Medications: Current Outpatient Medications  Medication Sig Dispense Refill   ARIPiprazole (ABILIFY) 10 MG tablet Take 1 tablet (10 mg total) by mouth in the morning and at bedtime. 60 tablet 2   ascorbic acid (VITAMIN C) 1000 MG tablet Take by mouth.     Calcium Citrate-Vitamin D 315-250 MG-UNIT TABS Take by mouth.     ciprofloxacin-dexamethasone (CIPRODEX) OTIC suspension SMARTSIG:In Ear(s)     clomiPRAMINE (ANAFRANIL) 25 MG capsule Take 1 capsule (25 mg total) by mouth at bedtime. [place]  capsule 0   ketoconazole (NIZORAL) 2 % cream      LORazepam (ATIVAN) 2 MG tablet Take 1 tablet (2 mg total) by mouth 3 (three) times daily as needed for up to 7 days for anxiety or sleep. 21 tablet 0   norgestimate-ethinyl estradiol (ORTHO-CYCLEN) 0.25-35 MG-MCG tablet Take 1 tablet by mouth daily.     Omega-3 Fatty Acids (FISH OIL) 1000 MG CAPS Take by mouth.     omeprazole (PRILOSEC) 20 MG capsule Take 20 mg by mouth daily.     simethicone (GAS-X) 80 MG chewable tablet Chew 1 tablet (80 mg total) by mouth every 6 (six) hours as needed for flatulence. 30 tablet 0   tiZANidine (ZANAFLEX) 2 MG tablet Take 1-2 tablets at night as needed by mouth before bed for back pain or muscle spasms     traZODone (DESYREL) 50 MG tablet Take 1 tablet (50 mg total) by mouth at bedtime and may repeat dose one time if needed. 60 tablet 1   No current facility-administered medications for this visit.     Musculoskeletal: Strength & Muscle Tone: Telepsych visit-Grossly normal Musculoskeletal and cranial nerve inspections Gait & Station: NA Patient leans: N/A   Psychiatric Specialty Exam: Review of Systems  There were no vitals taken for this visit.There is no height or weight on file to calculate BMI.My Chart  General  Appearance: Well Groomed  Eye Contact:  Good  Speech:  Clear and Coherent and Slow  Volume:  Normal  Mood:  Euthymic  Affect:  Congruent  Thought Process:  Coherent, Goal Directed, and Descriptions of Associations: Intact  Orientation:  Full (Time, Place, and Person)  Thought Content: WDL and Logical   Suicidal Thoughts:  No  Homicidal Thoughts:  No  Memory:   Limited  when psychotic  Judgement:  Other:  better  Insight:  Present  Psychomotor Activity:   Limited view   Concentration:  Concentration: Good and Attention Span: Good  Recall:  [place] of Knowledge:  WDL  Language: Good  Akathisia:  NA  Handed:  Right  AIMS (if indicated): NA  Assets:  Desire for Improvement Financial Resources/Insurance Housing Resilience Social Support Talents/Skills Transportation Vocational/Educational  ADL's:  Intact  Cognition: WNL  Sleep:   requiring medication now   Screenings: [place] Office Visit from 09/06/2019 in [place] Office Visit from 07/13/2018 in [place] Office Visit from 01/19/2018 in [place] Office Visit from 10/27/2017 in [place] Admission (Discharged) from 06/02/2017 in [place] Total Score 0 0 0 0 0      AUDIT    Flowsheet Row Admission (Discharged) from 06/02/2017 in [place] CHILD/ADOLES 100B  Alcohol Use Disorder Identification Test Final Score (AUDIT) 0        Assessment Improving     and Plan: Increase Abilify to 10 mg BID Continue Lorazepam FU 1 week   [name], PA-C 11/19/2021, 4:37 PM

## 2021-11-25 ENCOUNTER — Other Ambulatory Visit (HOSPITAL_COMMUNITY): Payer: Self-pay | Admitting: Medical

## 2021-11-25 NOTE — Telephone Encounter (Signed)
Mom called needing refill Only 3 pilss k l3eft She is giving it BID. Will see Ina tomorrow Also FMLA paperwork signed today for Mom as caretaker til Leona completely stabilized

## 2021-11-26 ENCOUNTER — Other Ambulatory Visit: Payer: Self-pay

## 2021-11-26 ENCOUNTER — Encounter (HOSPITAL_COMMUNITY): Payer: Self-pay | Admitting: Medical

## 2021-11-26 ENCOUNTER — Telehealth (HOSPITAL_COMMUNITY): Payer: 59 | Admitting: Medical

## 2021-11-26 DIAGNOSIS — Z79899 Other long term (current) drug therapy: Secondary | ICD-10-CM

## 2021-11-26 DIAGNOSIS — F411 Generalized anxiety disorder: Secondary | ICD-10-CM

## 2021-11-26 DIAGNOSIS — Z6835 Body mass index (BMI) 35.0-35.9, adult: Secondary | ICD-10-CM

## 2021-11-26 DIAGNOSIS — R7303 Prediabetes: Secondary | ICD-10-CM

## 2021-11-26 DIAGNOSIS — F429 Obsessive-compulsive disorder, unspecified: Secondary | ICD-10-CM

## 2021-11-26 DIAGNOSIS — E669 Obesity, unspecified: Secondary | ICD-10-CM

## 2021-11-26 DIAGNOSIS — K7581 Nonalcoholic steatohepatitis (NASH): Secondary | ICD-10-CM

## 2021-11-26 DIAGNOSIS — F258 Other schizoaffective disorders: Secondary | ICD-10-CM

## 2021-11-26 NOTE — Progress Notes (Signed)
[facility] MD/PA/NP OP Progress Note  11/26/2021 4:44 PM Valerie [name]  MRN:  [id number] Virtual Visit via Video Note  I connected with Valerie [name] on 11/26/21 at  3:30 PM EST by a video enabled telemedicine application and verified that I am speaking with the correct person using two identifiers.  Location: Patient: Home with Mom Provider: [place]   I discussed the limitations of evaluation and management by telemedicine and the availability of in person appointments. The patient expressed understanding and agreed to proceed.     I discussed the assessment and treatment plan with the patient. The patient was provided an opportunity to ask questions and all were answered. The patient agreed with the plan and demonstrated an understanding of the instructions.   The patient was advised to call back or seek an in-person evaluation if the symptoms worsen or if the condition fails to improve as anticipated.  I provided 20 minutes of non-face-to-face time during this encounter.   Valerie Russian, PA-C   Chief Complaint:  Chief Complaint   Follow-up; Paranoid; Medication Refill; Obesity; Insomnia    HPI: 1 week FU to monitor progress/response to medications for her Schizophrenic break originally thought to be rek lated to attempt to reduce Abilify. Subsequently patient admitted she had stopped taking her medications. Mom had not been monitoring her- just giving them to her to take. Her psychosis has cleared. She is still taking Ativan 2x daily/once at bedtime. She is still crying but she says this is due to her feelings about past behaviors (has + Chlamydia hx). She uses her Mom as Social worker and confidant and does not want outside therapy. Visit Diagnosis:    ICD-10-CM   1. Other schizoaffective disorders ([place])  F25.8     2. Obsessive-compulsive disorder, unspecified type  F42.9     3. Medication management  Z79.899     4. Steatohepatitis  K75.81     5. Class 2 obesity without serious  comorbidity with body mass index (BMI) of 35.0 to 35.9 in adult, unspecified obesity type  E66.9    Z68.35     6. Prediabetes  R73.03     7. GAD (generalized anxiety disorder)  F41.1      Past Psychiatric History:  Patient ID: Valerie [name], female   DOB: 02/16/1996, 26 y.o.   MRN: [id number] See Phone call from Mother-pt not sleeping again paranoia-had breif response to initial changes. No furthes mention of self harm activity/behaviors.Mom recalls BID dosing of Abilify.  Will adjust medications -pt has appt Thursday Abilify 10 mg BID  Lorazepam 2 mg TID PRN (1 at bedtime) Trazadone 50 mg HS with repeat PRN Has appt Thursday Mom to call if any problems/worsenimng. May need admission for medication stabilization?   OUTPATIENT 03/20/2015 to Present  [place] Patient and father noted that 1st episode began in November with a few days of irritability, anxiety, depression, and anger. This resolved.  She has had two more severe episodes since then between February and now where she is irritable, short tempered, appears to be responding to internal stimulation, reporting auditory hallucinations, cursing at family. Her grades have dropped from As and Bs to possibly needing Summer school.  Followed from 03/20/2015 to 02/24/2017 when she appeared in complete remission INPATIENT Date of Admission:  06/02/2017 Date of Discharge: 06/10/2017 Discharge Diagnoses:   Diagnosis Date Noted   Psychosis [F29] 06/03/2017   MDD (major depressive disorder), recurrent episode, severe ([place]) [E09.  Past Medical History:      Past Medical History:  Diagnosis Date   Anxiety     Depression     Fatigue     Headache     Psychosis ([place]) 06/03/2017   History reviewed. No pertinent surgical history.   Family Psychiatric History:  NOVANT Mental illness Maternal Uncle    Family History:  Family History reviewed  NOVANT Medical History Relation Name Comments   Stroke Maternal Grandfather      Asthma Maternal Grandmother      Mental illness Maternal Uncle      Diabetes Paternal Grandmother      Hypertension Paternal Grandmother          Social History:  Social History             Socioeconomic History   Marital status: Single      Spouse name: Not on file   Number of children: Not on file   Years of education: 13   Highest education level: Completing Aesthetics Course at [employer]  Occupational History   Student  Tobacco Use   Smoking status: Never   Smokeless tobacco: Never  Vaping Use   Vaping Use: Never used  Substance and Sexual Activity   Alcohol use: No   Drug use: No   Sexual activity: Not Currently      Birth control/protection: Pill, Abstinence  Other Topics Concern   Not on file  Social History Narrative   Not on file        Allergies:       Allergies  Allergen Reactions   Sulfa Antibiotics Hives and Itching      Hives on face      Metabolic Disorder Labs: DIABETES [facility] 11/19/2021 Component     Glucose  Hemoglobin A1c  Glucose, UA    Component 11/19/2021 09/24/2021 09/24/2021 01/18/2020                 Glucose 84 89 -- 87 Load older lab results  Hemoglobin A1c -- -- 5.6 -- Load older lab results  Glucose, UA -- --          Recent Labs       Lab Results  Component Value Date    PROLACTIN 79.1 (H) 06/04/2017      [place] 09/24/2021 Component     Cholesterol, Total  Triglycerides  HDL  VLDL Cholesterol Cal  LDL  Apolipoprotein A-1  LDL/HDL Ratio    Component 09/24/2021 09/14/2019             Cholesterol, Total 183 220 High    Load older lab results  Triglycerides 164 High    323 High    Load older lab results  HDL 65 -- Load older lab results  VLDL Cholesterol Cal 28 -- Load older lab results  LDL 90 -- Load older lab results  Apolipoprotein A-1 -- 214 High       LDL/HDL Ratio 1.4 --      [facility] 09/24/2021 Component     TSH   Free T4    Component 09/24/2021           TSH 1.890 Load older lab results  Free T4 1.19          Therapeutic Level Labs: No results found for: LITHIUM No results found for: VALPROATE No components found for:  CBMZ  Current Medications: Current Outpatient Medications  Medication Sig Dispense Refill   ARIPiprazole (ABILIFY) 10  MG tablet Take 1 tablet (10 mg total) by mouth in the morning and at bedtime. 60 tablet 2   ascorbic acid (VITAMIN C) 1000 MG tablet Take by mouth.     Calcium Citrate-Vitamin D 315-250 MG-UNIT TABS Take by mouth.     ciprofloxacin-dexamethasone (CIPRODEX) OTIC suspension SMARTSIG:In Ear(s)     clomiPRAMINE (ANAFRANIL) 25 MG capsule Take 1 capsule (25 mg total) by mouth at bedtime. 90 capsule 0   ketoconazole (NIZORAL) 2 % cream      LORazepam (ATIVAN) 2 MG tablet TAKE 1 TABLET BY MOUTH THREE TIMES DAILY AS NEEDED FOR ANXIETY OR  SLEEP  FOR  UP  TO  7  DAYS 21 tablet 0   norgestimate-ethinyl estradiol (ORTHO-CYCLEN) 0.25-35 MG-MCG tablet Take 1 tablet by mouth daily.     Omega-3 Fatty Acids (FISH OIL) 1000 MG CAPS Take by mouth.     omeprazole (PRILOSEC) 20 MG capsule Take 20 mg by mouth daily.     simethicone (GAS-X) 80 MG chewable tablet Chew 1 tablet (80 mg total) by mouth every 6 (six) hours as needed for flatulence. 30 tablet 0   tiZANidine (ZANAFLEX) 2 MG tablet Take 1-2 tablets at night as needed by mouth before bed for back pain or muscle spasms     traZODone (DESYREL) 50 MG tablet Take 1 tablet (50 mg total) by mouth at bedtime and may repeat dose one time if needed. 60 tablet 1   No current facility-administered medications for this visit.    Musculoskeletal: Strength & Muscle Tone: Telepsych visit-Grossly normal Musculoskeletal and cranial nerve inspections Gait & Station: NA Patient leans: N/A   Psychiatric Specialty Exam: Review of Systems  Constitutional:  Negative for activity change (Hallucinations/ paranoia subsided), appetite  change, chills, diaphoresis, fatigue, fever and unexpected weight change.  HENT: Negative.    Eyes: Negative.   Respiratory: Negative.    Cardiovascular: Negative.   Gastrointestinal: Negative.   Endocrine: Negative.   Genitourinary: Negative.   Musculoskeletal: Negative.   Allergic/Immunologic: Negative.   Neurological:  Negative for dizziness, tremors, syncope, facial asymmetry, speech difficulty, weakness, light-headedness, numbness and headaches.  Hematological: Negative.   Psychiatric/Behavioral:  Positive for dysphoric mood and sleep disturbance. Negative for agitation, behavioral problems, confusion, decreased concentration, hallucinations, self-injury and suicidal ideas. The patient is nervous/anxious. The patient is not hyperactive.    There were no vitals taken for this visit.There is no height or weight on file to calculate BMI.My Chart Visit  General Appearance: Well Groomed  Eye Contact:  Good  Speech:  Clear and Coherent  Volume:  Normal  Mood:   Variable  Affect:  Appropriate and Congruent  Thought Process:  Coherent, Goal Directed, and Descriptions of Associations: Intact  Orientation:  Full (Time, Place, and Person)  Thought Content: WDL, Logical, and Rumination   Suicidal Thoughts:  No  Homicidal Thoughts:  No  Memory:   Appears to have some troublesome memories  Judgement:  Fair  Insight:  Lacking  Psychomotor Activity:   Limited WNL  Concentration:  Concentration: Good and Attention Span: Good  Recall:   Intact  Fund of Knowledge:  WDL  Language: Good  Akathisia:  Negative  Handed:  Right  AIMS (if indicated): not done  Assets:  Communication Skills Desire for Improvement Financial Resources/Insurance Housing Resilience Social Support Transportation Vocational/Educational  ADL's:  Intact  Cognition: WNL  Sleep:  Fair   Screenings: [place] Office Visit from 09/06/2019 in [place] AT  [place] Office  Visit from 07/13/2018 in [place] Office Visit from 01/19/2018 in [place] Office Visit from 10/27/2017 in [place] Admission (Discharged) from 06/02/2017 in [place] CHILD/ADOLES 100B  AIMS Total Score 0 0 0 0 0      AUDIT    Flowsheet Row Admission (Discharged) from 06/02/2017 in [place] CHILD/ADOLES 100B  Alcohol Use Disorder Identification Test Final Score (AUDIT) 0        Assessment : Improved psychosis-mood disturbed -?guilt over past   and Plan: Decrease Ativan-pt advised this will stop after this rx and alternative meds used.Continue Ambien at 20 mg Plan to decrease to 10 mg Mom given Family leave to continue to watch daughter .Hpoefully she will be back to fully functional in next 2 weeks? FU 1 week   Valerie Russian, PA-C 11/26/2021, 4:44 PM

## 2021-12-03 ENCOUNTER — Ambulatory Visit (HOSPITAL_COMMUNITY)
Admission: EM | Admit: 2021-12-03 | Discharge: 2021-12-03 | Disposition: A | Discharge status: Home / Self Care | Payer: 59

## 2021-12-03 ENCOUNTER — Other Ambulatory Visit: Payer: Self-pay

## 2021-12-03 ENCOUNTER — Encounter (HOSPITAL_COMMUNITY): Payer: Self-pay | Admitting: Medical

## 2021-12-03 ENCOUNTER — Telehealth (HOSPITAL_BASED_OUTPATIENT_CLINIC_OR_DEPARTMENT_OTHER): Payer: 59 | Admitting: Medical

## 2021-12-03 DIAGNOSIS — F429 Obsessive-compulsive disorder, unspecified: Secondary | ICD-10-CM

## 2021-12-03 DIAGNOSIS — F411 Generalized anxiety disorder: Secondary | ICD-10-CM | POA: Diagnosis not present

## 2021-12-03 DIAGNOSIS — E669 Obesity, unspecified: Secondary | ICD-10-CM

## 2021-12-03 DIAGNOSIS — R7303 Prediabetes: Secondary | ICD-10-CM

## 2021-12-03 DIAGNOSIS — F258 Other schizoaffective disorders: Secondary | ICD-10-CM

## 2021-12-03 DIAGNOSIS — Z79899 Other long term (current) drug therapy: Secondary | ICD-10-CM

## 2021-12-03 DIAGNOSIS — K7581 Nonalcoholic steatohepatitis (NASH): Secondary | ICD-10-CM | POA: Diagnosis not present

## 2021-12-03 DIAGNOSIS — Z6835 Body mass index (BMI) 35.0-35.9, adult: Secondary | ICD-10-CM

## 2021-12-03 NOTE — BH Assessment (Signed)
Comprehensive Clinical Assessment (CCA) Note  12/03/2021 Valerie [name] [id number]  Disposition: Margorie John, PA-C recommends pt to be admitted to [facility] for Continuous Assessment. Clinician observed PA discuss the process of overnight observation with the pt and father and gave them time to think about it however pt continued to refuse to stay. Family was provided additional resources for therapy to follow up.   [facility] from 12/03/2021 in [place] No Risk      The patient demonstrates the following risk factors for suicide: Chronic risk factors for suicide include: psychiatric disorder of Other Schizoaffective Disorders ([place]) . Acute risk factors for suicide include: N/A. Protective factors for this patient include: positive social support and positive therapeutic relationship. Considering these factors, the overall suicide risk at this point appears to be no risk . Patient is appropriate for outpatient follow up.  Valerie [name] is a 26 year old who presents voluntary and accompanied by her parents Lenna Sciara and Merrily Tegeler) to [facility]. Clinician asked the pt, "what brought you to the hospital?" Pt reports, "I have a fever." Clinician asked if she was here for a medical concern however pt expressed her behaviors haven't been that good. Clinician asked the pt to described her behaviors of concern. Clinician observed the pt look her mother, pt wanted her mother to describe her behaviors. Pt's mother reports, she's not focusing, she's forgetful (she has to repeat herself) and is confused. Pt's mother denies pt's behaviors are aggressive. Per mother, the pt paces. Pt's mother reports, the hears voices and ask her if she called her name. Initially, pt denied hearing voices she later admitted hearing voices and denied commands. Pt's father reports, the pt is not getting much sleep. Clinician asked the pt what type of help she was  seeking, pt replied behavioral help. Pt denied SI, HI, self-injurious behaviors and access to weapons. Pt's parents  denies concerns of SI, HI, self-injurious behaviors and access to weapons.   Pt denies, substance use. Per mother, pt is linked to [name] for medication management. Per mother, pt takes her medications as prescribed.   Pt presents quiet, awake with normal speech. During the assessment pt mostly had her parents talk but she would engage at times. Pt's mood was euthymic. Pt's affect was congruent. Pt's insight and judgement was fair. Pt and parents can contract for safety.  Diagnosis: Other Schizoaffective Disorders ([place]).  Chief Complaint: No chief complaint on file.  Visit Diagnosis:     CCA Screening, Triage and Referral (STR)  Patient Reported Information How did you hear about [place]? Family/Friend  What Is the Reason for Your Visit/Call Today? Pt reports, she came in because she had a fever, pt then responses that her behaviors are not good. Clinician asked the pt to described her behavors however she wanted her mother to. Per mother, the pt is not focusing, forgetful and confused. Pt mothers denies the pt has aggressive behaviors. Per mother, the hears noises however the pt denies, AVH. Per father the pt is sensitive, not focused and can't sleep. Pt also denies, SI, HI, self-injurious behaviors and access to weapons. Clinician asked what type of help is she seeking. pt replied behavioral help. Pt's parents denies concerns of SI, HI, self-injurious behaviors and access to weapons. Pt and parents can contract for safety.  How Long Has This Been Causing You Problems? No data recorded What Do You Feel Would Help You the Most Today? Treatment for  Depression or other mood problem; Medication(s)   Have You Recently Had Any Thoughts About Hurting Yourself? No  Are You Planning to Commit Suicide/Harm Yourself At This time? No   Have you Recently Had Thoughts About [place]? No  Are You Planning to Harm Someone at This Time? No  Explanation: No data recorded  Have You Used Any Alcohol or Drugs in the Past 24 Hours? No  How Long Ago Did You Use Drugs or Alcohol? No data recorded What Did You Use and How Much? No data recorded  Do You Currently Have a Therapist/Psychiatrist? Yes  Name of Therapist/Psychiatrist: [name], medication management. Per mother, pt takes her medications as prescribed.   Have You Been Recently Discharged From Any Office Practice or Programs? No data recorded Explanation of Discharge From Practice/Program: No data recorded    CCA Screening Triage Referral Assessment Type of Contact: Face-to-Face  Telemedicine Service Delivery:   Is this Initial or Reassessment? No data recorded Date Telepsych consult ordered in CHL:  No data recorded Time Telepsych consult ordered in CHL:  No data recorded Location of Assessment: [facility] Assessment Services  Provider Location: [facility] Assessment Services   Collateral Involvement: [place] and Khalea Ventura, parents.   Does Patient Have a Stage manager Guardian? No data recorded Name and Contact of Legal Guardian: No data recorded If Minor and Not Living with Parent(s), Who has Custody? No data recorded Is CPS involved or ever been involved? No data recorded Is APS involved or ever been involved? No data recorded  Patient Determined To Be At Risk for Harm To Self or Others Based on Review of Patient Reported Information or Presenting Complaint? No  Method: No data recorded Availability of Means: No data recorded Intent: No data recorded Notification Required: No data recorded Additional Information for Danger to Others Potential: No data recorded Additional Comments for Danger to Others Potential: No data recorded Are There Guns or Other Weapons in Your Home? No data recorded Types of Guns/Weapons: No data recorded Are These Weapons Safely Secured?                             No data recorded Who Could Verify You Are Able To Have These Secured: No data recorded Do You Have any Outstanding Charges, Pending Court Dates, Parole/Probation? No data recorded Contacted To Inform of Risk of Harm To Self or Others: No data recorded   Does Patient Present under Involuntary Commitment? No  IVC Papers Initial File Date: No data recorded  [place] of Residence: No data recorded  Patient Currently Receiving the Following Services: Medication Management   Determination of Need: Routine (7 days)   Options For Referral: Medication Management; Inpatient Hospitalization; [facility] Urgent Care; Facility-Based Crisis; Outpatient Therapy     CCA Biopsychosocial Patient Reported Schizophrenia/Schizoaffective Diagnosis in Past: No data recorded  Strengths: No data recorded  Mental Health Symptoms Depression:   Sleep (too much or little); Hopelessness; Worthlessness; Tearfulness; Irritability   Duration of Depressive symptoms:    Mania:  No data recorded  Anxiety:   No data recorded  Psychosis:   Hallucinations   Duration of Psychotic symptoms:    Trauma:   None   Obsessions:  No data recorded  Compulsions:  No data recorded  Inattention:   Disorganized; Forgetful; Loses things   Hyperactivity/Impulsivity:   Fidgets with hands/feet   Oppositional/Defiant Behaviors:   Angry   Emotional Irregularity:  No data recorded  Other Mood/Personality Symptoms:  No data recorded   Mental Status Exam Appearance and self-care  Stature:   Average   Weight:   Overweight   Clothing:   Casual   Grooming:   Normal   Cosmetic use:   None   Posture/gait:   Normal   Motor activity:   Not Remarkable   Sensorium  Attention:   Normal   Concentration:   Normal   Orientation:   X5   Recall/memory:   Normal   Affect and Mood  Affect:   Congruent   Mood:   Euthymic   Relating  Eye contact:   Normal   Facial expression:    Responsive   Attitude toward examiner:   Guarded   Thought and Language  Speech flow:  Normal   Thought content:  No data recorded  Preoccupation:   None   Hallucinations:   Auditory (Per parents.)   Organization:  No data recorded  [employer] of Knowledge:  No data recorded  Intelligence:  No data recorded  Abstraction:  No data recorded  Judgement:   Fair   Reality Testing:  No data recorded  Insight:   Fair   Decision Making:  No data recorded  Social Functioning  Social Maturity:  No data recorded  Social Judgement:  No data recorded  Stress  Stressors:   Other (Comment) (Pt denies, stressors.)   Coping Ability:  No data recorded  Skill Deficits:   Communication   Supports:   Family     Religion: Religion/Spirituality Are You A Religious Person?: Yes What is Your Religious Affiliation?: Catholic  Leisure/Recreation: Leisure / Recreation Do You Have Hobbies?: Yes Leisure and Hobbies: Running.  Exercise/Diet: Exercise/Diet Do You Exercise?: Yes What Type of Exercise Do You Do?: Run/Walk How Many Times a Week Do You Exercise?: 1-3 times a week Do You Follow a Special Diet?: No Do You Have Any Trouble Sleeping?: Yes Explanation of Sleeping Difficulties: Per father, for the past three weeks pt's been getting 2-3 hours of sleep.   CCA Employment/Education Employment/Work Situation: Employment / Work Situation Employment Situation: Unemployed Has Patient ever Been in Passenger transport manager?: No  Education: Education Is Patient Currently Attending School?: No Last Grade Completed: 12 Did You Nutritional therapist?: Yes What Type of College Degree Do you Have?: Pt was attending [employer].   CCA Family/Childhood History Family and Relationship History: Family history Marital status: Single Does patient have children?: No  Childhood History:  Childhood History By whom was/is the patient raised?: Both parents Did patient suffer  any verbal/emotional/physical/sexual abuse as a child?: No Did patient suffer from severe childhood neglect?: No Has patient ever been sexually abused/assaulted/raped as an adolescent or adult?: No Was the patient ever a victim of a crime or a disaster?: No Witnessed domestic violence?: No Has patient been affected by domestic violence as an adult?:  (NA)  Child/Adolescent Assessment:     CCA Substance Use Alcohol/Drug Use: Alcohol / Drug Use Pain Medications: See MAR Prescriptions: See MAR Over the Counter: See MAR History of alcohol / drug use?: No history of alcohol / drug abuse     ASAM's:  Six Dimensions of Multidimensional Assessment  Dimension 1:  Acute Intoxication and/or Withdrawal Potential:      Dimension 2:  Biomedical Conditions and Complications:      Dimension 3:  Emotional, Behavioral, or Cognitive Conditions and Complications:     Dimension 4:  Readiness to Change:  Dimension 5:  Relapse, Continued use, or Continued Problem Potential:     Dimension 6:  Recovery/Living Environment:     ASAM Severity Score:    ASAM Recommended Level of Treatment:     Substance use Disorder (SUD)    Recommendations for Services/Supports/Treatments: Recommendations for Services/Supports/Treatments Recommendations For Services/Supports/Treatments: Other (Comment) (Pt to be admitted to [facility] for Continuous Assessment.)  Discharge Disposition:    DSM5 Diagnoses: Patient Active Problem List   Diagnosis Date Noted   Chlamydia infection 01/04/2020   Acanthosis nigricans 11/02/2018   Obesity 04/13/2018   Psychosis ([place]) 06/03/2017   MDD (major depressive disorder), recurrent episode, severe ([place]) 06/02/2017     Referrals to Alternative Service(s): Referred to Alternative Service(s):   Place:   Date:   Time:    Referred to Alternative Service(s):   Place:   Date:   Time:    Referred to Alternative Service(s):   Place:   Date:   Time:    Referred to Alternative  Service(s):   Place:   Date:   Time:     Vertell Novak, [facility] Comprehensive Clinical Assessment (CCA) Screening, Triage and Referral Note  12/03/2021 Valerie [name] [id number]  Chief Complaint: No chief complaint on file.  Visit Diagnosis:   Patient Reported Information How did you hear about [place]? Family/Friend  What Is the Reason for Your Visit/Call Today? Pt reports, she came in because she had a fever, pt then responses that her behaviors are not good. Clinician asked the pt to described her behavors however she wanted her mother to. Per mother, the pt is not focusing, forgetful and confused. Pt mothers denies the pt has aggressive behaviors. Per mother, the hears noises however the pt denies, AVH. Per father the pt is sensitive, not focused and can't sleep. Pt also denies, SI, HI, self-injurious behaviors and access to weapons. Clinician asked what type of help is she seeking. pt replied behavioral help. Pt's parents denies concerns of SI, HI, self-injurious behaviors and access to weapons. Pt and parents can contract for safety.  How Long Has This Been Causing You Problems? No data recorded What Do You Feel Would Help You the Most Today? Treatment for Depression or other mood problem; Medication(s)   Have You Recently Had Any Thoughts About Hurting Yourself? No  Are You Planning to Commit Suicide/Harm Yourself At This time? No   Have you Recently Had Thoughts About [place]? No  Are You Planning to Harm Someone at This Time? No  Explanation: No data recorded  Have You Used Any Alcohol or Drugs in the Past 24 Hours? No  How Long Ago Did You Use Drugs or Alcohol? No data recorded What Did You Use and How Much? No data recorded  Do You Currently Have a Therapist/Psychiatrist? Yes  Name of Therapist/Psychiatrist: [name], medication management. Per mother, pt takes her medications as prescribed.   Have You Been Recently Discharged From Any Office Practice  or Programs? No data recorded Explanation of Discharge From Practice/Program: No data recorded   CCA Screening Triage Referral Assessment Type of Contact: Face-to-Face  Telemedicine Service Delivery:   Is this Initial or Reassessment? No data recorded Date Telepsych consult ordered in CHL:  No data recorded Time Telepsych consult ordered in CHL:  No data recorded Location of Assessment: [facility] Assessment Services  Provider Location: [facility] Assessment Services   Collateral Involvement: [place] and Sami Roes, parents.   Does Patient Have a Stage manager Guardian? No data  recorded Name and Contact of Legal Guardian: No data recorded If Minor and Not Living with Parent(s), Who has Custody? No data recorded Is CPS involved or ever been involved? No data recorded Is APS involved or ever been involved? No data recorded  Patient Determined To Be At Risk for Harm To Self or Others Based on Review of Patient Reported Information or Presenting Complaint? No  Method: No data recorded Availability of Means: No data recorded Intent: No data recorded Notification Required: No data recorded Additional Information for Danger to Others Potential: No data recorded Additional Comments for Danger to Others Potential: No data recorded Are There Guns or Other Weapons in Your Home? No data recorded Types of Guns/Weapons: No data recorded Are These Weapons Safely Secured?                            No data recorded Who Could Verify You Are Able To Have These Secured: No data recorded Do You Have any Outstanding Charges, Pending Court Dates, Parole/Probation? No data recorded Contacted To Inform of Risk of Harm To Self or Others: No data recorded  Does Patient Present under Involuntary Commitment? No  IVC Papers Initial File Date: No data recorded  [place] of Residence: No data recorded  Patient Currently Receiving the Following Services: Medication Management   Determination of  Need: Routine (7 days)   Options For Referral: Medication Management; Inpatient Hospitalization; [facility]; Facility-Based Crisis; Outpatient Therapy   Discharge Disposition:     Vertell Novak, [place], [place], [facility], [place] [facility] Triage Specialist [phone]

## 2021-12-03 NOTE — Progress Notes (Signed)
[facility] MD/PA/NP OP Progress Note  12/03/2021 4:21 PM [name]  MRN:  [id number] Virtual Visit via Video Note  I connected with Valerie [name] on 12/03/21 at  3:30 PM EST by a video enabled telemedicine application and verified that I am speaking with the correct person using two identifiers.  Location: Patient: At home Provider: At [place]   I discussed the limitations of evaluation and management by telemedicine and the availability of in person appointments. The patient expressed understanding and agreed to proceed.  History of Present Illness:See EPIC note    Observations/Objective:See EPIC note   Assessment and Plan:See EPIC note   Follow Up Instructions:See EPIC note    I discussed the assessment and treatment plan with the patient. The patient was provided an opportunity to ask questions and all were answered. The patient agreed with the plan and demonstrated an understanding of the instructions.   The patient was advised to call back or seek an in-person evaluation if the symptoms worsen or if the condition fails to improve as anticipated.  I provided 30 minutes of non-face-to-face time during this encounter.   [name], PA-C   Chief Complaint:  Chief Complaint   Follow-up; Agitation    HPI: 1 week FU FOR PSYCHOTIC RELAPSE AS FOLLOWS Received message from pt's father Vessie Olmsted this morning concerning pt's recent behavior. Mr. Crego states that pt is not sleeping due to increase in auditory hallucinations and paranoia. Father states that they are "up all night taking care of her" and would like some direction from you. Pt next appointment is scheduled for 12/09/21. Please review and advise. Thanks.    On 11/10/2021 I contacted father by phone. Xanax worked to put her to sleep and quell her agitation on awakening.She was also cleared metally in terms of hallucinosis and paranoia with return to 10 mg dose of Abilify. Father inquired about daughter's desire  to have marriage and children.Informed with proper care she should be able to.Will make her an appt for 11/19/21 -He will call sooner if needed    Electronically signed by Alison Murray, LPN at 1/74/9449  6:75 AM  Electronically signed by Dara Hoyer, PA-C at 11/12/2021  3:24 PM Patient ID: Valerie [name], female   DOB: May 23, 1996, 26 y.o.   MRN: [id number] See Phone call from Mother-pt not sleeping again paranoia-had breif response to initial changes. No furthes mention of self harm activity/behaviors.Mom recalls BID dosing of Abilify.  Will adjust medications -pt has appt Thursday Abilify 10 mg BID  Lorazepam 2 mg TID PRN (1 at bedtime) Trazadone 50 mg HS with repeat PRN Has appt Thursday Mom to call if any problems/worsenimng. May need admission for medication stabilization?        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 She is showing improvement with increase in her Abilify and benzodiaepene therapy Xanax initially  now Ativan. Originaaly thought her relapse was due to attempt to taper Abilify but today she admits to not taking her medication.Mom reports she had gotten into giving meds to her to take but not checking to see if she took it.  Assessment Improving         and Plan: Increase Abilify to 10 mg BID Continue Lorazepam FU 1 week   11/26/2021 4:44 PM Shikita Vaillancourt  MRN:  [id number] Virtual Visit via Video Note   CC;Follow-up; Paranoid; Medication Refill; Obesity; Insomnia      HPI: 1 week FU to monitor progress/response to medications for her Schizophrenic break  originally thought to be rek lated to attempt to reduce Abilify. Subsequently patient admitted she had stopped taking her medications. Mom had not been monitoring her- just giving them to her to take. Her psychosis has cleared. She is still taking Ativan 2x daily/once at bedtime. She is still crying but she says this is due to her feelings about past behaviors (has + Chlamydia hx). She uses her Mom as Social worker and confidant and does not want outside therapy. Assessment : Improved psychosis-mood disturbed -?guilt over past and Plan: Decrease Ativan-pt advised this will stop after this rx and alternative meds used.Continue Ambien at 20 mg Plan to decrease to 10 mg Mom given Family leave to continue to watch daughter .Hpoefully she will be back to fully functional in next 2 weeks? FU 1 week [name], PA-C  TODAY [name] and Mom return as scheduled but Mom reports Valerie [name] isnt responding as hoped. She continues to be agitated and inappropriate behaviors at times. She wants to drive. Mad at mom because she is told she cannot drive right now.  Visit Diagnosis:    ICD-10-CM   1. Other schizoaffective disorders ([place])  F25.8     2. Obsessive-compulsive disorder, unspecified type  F42.9     3. Medication management  Z79.899     4. Steatohepatitis  K75.81     5. Class 2 obesity without serious comorbidity with body mass index (BMI) of 35.0 to 35.9 in adult, unspecified obesity type  E66.9    Z68.35     6. Prediabetes  R73.03     7. GAD (generalized anxiety disorder)  F41.1      Past Psychiatric History:   Past Medical History:  Past Medical History: Diagnosis Date  Anxiety   Depression   Fatigue   Headache   Psychosis ([place]) 06/03/2017  Current Medications: Current Outpatient Medications  Medication Sig Dispense Refill   omeprazole (PRILOSEC) 20 MG capsule Take by mouth.     ARIPiprazole (ABILIFY) 10 MG tablet Take 1 tablet (10 mg total) by mouth in the morning and at bedtime. 60  tablet 2   ascorbic acid (VITAMIN C) 1000 MG tablet Take by mouth.     Calcium Citrate-Vitamin D 315-250 MG-UNIT TABS Take by mouth.     ciprofloxacin-dexamethasone (CIPRODEX) OTIC suspension SMARTSIG:In Ear(s)     clomiPRAMINE (ANAFRANIL) 25 MG capsule Take 1 capsule (25 mg total) by mouth at bedtime. 90 capsule 0   ketoconazole (NIZORAL) 2 % cream      LORazepam (ATIVAN) 2 MG tablet TAKE 1 TABLET BY MOUTH THREE TIMES DAILY AS NEEDED FOR ANXIETY OR  SLEEP  FOR  UP  TO  7  DAYS 21 tablet 0   norgestimate-ethinyl estradiol (ORTHO-CYCLEN) 0.25-35 MG-MCG tablet Take 1 tablet by mouth daily.     Omega-3 Fatty Acids (FISH OIL) 1000 MG CAPS Take by mouth.     omeprazole (PRILOSEC) 20 MG capsule Take 20 mg by mouth daily.     simethicone (GAS-X) 80 MG chewable tablet Chew 1 tablet (80 mg total) by mouth every 6 (six) hours as needed for flatulence. 30 tablet 0  tiZANidine (ZANAFLEX) 2 MG tablet Take 1-2 tablets at night as needed by mouth before bed for back pain or muscle spasms     traZODone (DESYREL) 50 MG tablet Take 1 tablet (50 mg total) by mouth at bedtime and may repeat dose one time if needed. 60 tablet 1   No current facility-administered medications for this visit.   Musculoskeletal: Strength & Muscle Tone: Telepsych visit-Grossly normal Musculoskeletal and cranial nerve inspections Gait & Station: NA Patient leans: N/A    Psychiatric Specialty Exam: Review of Systems  Constitutional:  Positive for activity change. Negative for appetite change, chills, diaphoresis, fatigue, fever and unexpected weight change.  Neurological:  Negative for dizziness, tremors, seizures, syncope, facial asymmetry, speech difficulty, weakness, light-headedness, numbness and headaches.  Psychiatric/Behavioral:  Positive for agitation, behavioral problems, confusion, dysphoric mood and sleep disturbance. Negative for decreased concentration, hallucinations, self-injury and suicidal ideas. The patient is  nervous/anxious. The patient is not hyperactive.   All other systems reviewed and are negative.  There were no vitals taken for this visit.There is no height or weight on file to calculate BMI.My Chart Visit  General Appearance: Casual and Neat  Eye Contact:  Good  Speech:  Clear and Coherent  Volume:  Normal  Mood:  Anxious  Affect:  Flat  Thought Process:  Coherent and Descriptions of Associations: Intact  Orientation:  Full (Time, Place, and Person)  Thought Content: Rumination   Suicidal Thoughts:  No  Homicidal Thoughts:  No  Memory:   ? Troubled by past memories of behavior  Judgement:  Impaired  Insight:  Lacking  Psychomotor Activity:  Decreased by limited view  Concentration:  Concentration: Negative and Attention Span: Negative  Recall:  Negative  Fund of Knowledge:  WDL  Language: Good  Akathisia:  No  Handed:  Right  AIMS (if indicated): not done  Assets:  Desire for Improvement Financial Resources/Insurance Housing Resilience Social Support Transportation Vocational/Educational  ADL's:  Impaired  Cognition: Impaired,  Moderate  Sleep:  Poor   Screenings: [facility] [place] Office Visit from 09/06/2019 in [facility] Office Visit from 07/13/2018 in [facility] Office Visit from 01/19/2018 in [facility] Office Visit from 10/27/2017 in [facility] Admission (Discharged) from 06/02/2017 in [facility] Total Score 0 0 0 0 0      AUDIT    Flowsheet Row Admission (Discharged) from 06/02/2017 in [facility] CHILD/[facility]  Alcohol Use Disorder Identification Test Final Score (AUDIT) 0        Assessment Not responding fully to attempt at Outpatient recovery    and Plan: Recommend admission to [facility].for complete stabilization and medication  management [place] texted to Mother Nurse will notify [facility] Mom to call if needed. FU on discharge Addendum-Mom sys problem with FMLA ? Names mixed up so daughter is beneficiary. Nurse will call 800 number    [name], PA-C 12/03/2021, 4:21 PM

## 2021-12-03 NOTE — ED Provider Notes (Addendum)
Behavioral Health Urgent Care Medical Screening Exam  Patient Name: Valerie [name] MRN: [id number] Date of Evaluation: 12/04/21 Chief Complaint:  "[place]"  Diagnosis:  Final diagnoses:  Other schizoaffective disorders ([place])  Obsessive-compulsive disorder, unspecified type  GAD (generalized anxiety disorder)    History of Present illness: Valerie [name] ("Valerie [name]") is a 26 y.o. female with past psychiatric history significant for other schizoaffective disorders, psychosis, psychotic paranoia, brief psychotic disorder, paranoid schizophrenia, OCD, GAD, and MDD, as well as past medical history significant for obesity, acanthosis nigricans, steatohepatitis, and prediabetes, who presents to the [facility] behavioral health urgent care [facility]) as a voluntary walk-in accompanied by her mother Fayola Meckes: [phone]) and father Truly Stankiewicz: [phone]) for walk-in evaluation.  With patient's consent, patient's father present during the evaluation and provided information during the evaluation in addition to information provided by the patient.  Patient states that she came to the [facility] for "behavioral health help".  When patient is asked to provide further details regarding this, patient's father states that the patient sees an outpatient psychiatric provider [name], PA-C) through [place] [facility] for psychotropic medication management, that the patient had an outpatient psychiatry appointment today and during this appointment, patient's psychiatric provider recommended to patient and patient's mother for the patient to come to the [facility] because "her meds are not helping".  Per chart review, it appears that patient had an outpatient psychotropic medication management appointment today on 12/03/2021 and is documented in this 12/03/21 outpatient visit that it was recommended for the patient to be admitted to [facility]) and that information for  the [facility] was provided to patient's mother.    Patient denies SI on exam.  She denies experiencing any SI recently over the past few weeks.  Patient denies history of any previous suicide attempts.  Patient denies history of self-injurious behavior via intentionally cutting or burning herself.  She denies HI.  Patient's father denies any history of the patient making any suicidal or homicidal statements to him over the past few months.  She denies AVH currently on exam.  Patient and patient's father did report that the patient has been experiencing intermittent AVH.  As for patient's auditory hallucinations, patient's father reports that the patient hears "voices and noises" and also will think that she is hearing her mother or father calling her name when they are actually not calling her name.  Patient's father describes patient's visual hallucinations as the patient "seeing sayings at times".  Patient does not or AVH, but she does agree with her father's description of her AVH.  Patient and patient's father did not provide further details regarding frequency of AVH, when patient last experienced AVH, or how long patient has specifically been experiencing AVH, although patient does report that she has been experiencing AVH for "a long time".  Patient denies paranoia, but patient's father reports that the patient does experience intermittent paranoia at times, specifically that sometimes the patient will be suspicious that people she sees in public may be following her.  Patient also states that her mom finds issues with the way that she cleans at home.  Patient states that she does not believe that she cleans excessively at home or engages in overly repetitive cleaning behaviors.  Patient and her father described patient's sleep as poor, stating the patient slept about 6 to 7 hours last night but that the patient slept about 2 to 4 hours on the 2-3 nights prior to last night.  Patient denies anhedonia.  She  does endorse intermittent feelings of guilt, hopelessness, and worthlessness.  She also endorses intermittent declines in energy and concentration.  Patient describes her appetite as normal and patient's father states that he believes the patient has been eating more recently than she used to, which father attributes to recent adjustments of patient's medications.  Patient and patient's father deny any recent significant weight changes and the patient in the past month.  Patient's father reports that the patient does not have a therapist at this time. Per chart review, patient was psychiatrically hospitalized at [facility]) from 06/02/2017 to 06/10/2017 for psychosis.  Patient's father denies any additional inpatient psychiatric hospitalizations for the patient since this 2018 [facility] admission noted above.  Patient's home psychotropic medication regimen reviewed with patient's father, in which patient's father confirms that patient's home psychotropic medication regimen consists of Abilify 10 mg p.o. twice daily (father reports that this was recently increased from once daily to twice daily), clomipramine 25 mg p.o. at bedtime, Ativan 2 mg p.o. 3 times daily as needed for anxiety or sleep, and trazodone 50 mg p.o. at bedtime with 1 time repeat as needed for sleep.  These medications appear to be consistent with patient's father's report per my chart review.  Per PDMP review, patient recently had 7-day supply of 21 Ativan 2 mg tablets filled on 11/25/2021.  Patient's father reports that patient has not taken much of the Ativan prescription noted above at this point.  Patient lives in [place] with her mother, father, and paternal uncle.  Patient's father denies access to firearms in the home.  Patient denies alcohol, tobacco/nicotine, or illicit substance use.  Patient is unemployed at this time.  Patient's highest level of education is some college.  Patient states that her main sources of  support are her mother and father.  Patient verbally contracts for safety with this Probation officer.  Patient's father states that he does not have any safety concerns regarding the patient at this time.  Patient also states that she feels like she has a fever.  Patient's temperature 100.2 F here at the [facility]  Patient denies any additional physical symptoms at this time.  On exam, patient is sitting upright in no acute distress.  Eye contact is good.  Speech is clear and coherent with normal volume.  Speech rate is slightly increased at times.  Mood is euthymic with congruent affect.  Thought process is coherent, goal directed, and linear.  Patient is alert and oriented to person, place, time, and situation.  Patient is cooperative and answers all questions asked during the evaluation.  No indication of patient is responding to internal/external stimuli at this time.  No delusional thought content noted on exam at this time.  With patient's consent, this provider and TTS counselor Merian Capron [place], [facility]) obtained further collateral information by speaking with patient's mother Desia Saban: [phone]).  Patient's mother reports that the patient has not been sleeping well for the past 2 to 3 weeks.  Mother also reports that the patient wants to clean at home but will often not finish other tasks that she started beforehand.  Patient's mother also states that the patient has intermittently been confused over the past 2 to 3 weeks.  When patient's mother is asked to provide further details regarding this, patient's mother states that earlier today when she asked the patient to get ingredients at the grocery store to make chicken soup for dinner, the patient only remembered  to buy one of the ingredients.  Patient's mother confirms patient's psychotropic medication regimen noted above and she states that the patient takes all of her medications as prescribed, but does also state that the patient has not been  taking much of her above Ativan prescription.  Patient's mother denies any concerns regarding the patient having suicidal or homicidal ideation.  Patient's mother states that she does not have any safety concerns regarding the patient at this time.  Further obtained collateral information from patient's mother is shown below (as documented in [place], [facility] 12/03/21 note):   "Pt's mother reports, she's not focusing, she's forgetful (she has to repeat herself) and is confused. Pt's mother denies pt's behaviors are aggressive. Per mother, the pt paces. Pt's mother reports, the hears voices and ask her if she called her name."  Psychiatric Specialty Exam  Presentation  General Appearance:Bizarre; Well Groomed  Eye Contact:Good  Speech:Clear and Coherent (Speech rate slightly increased at times.)  Speech Volume:Normal  Handedness:No data recorded  Mood and Affect  Mood:Euthymic  Affect:Congruent   Thought Process  Thought Processes:Coherent; Goal Directed; Linear  Descriptions of Associations:Intact  Orientation:Full (Time, Place and Person)  Thought Content:Logical (Patient denies paranoia, but patient's father reports patient has been experiencing episodes of mild intermittent paranoia (see HPI for details).)    Hallucinations:-- (Patient denies AVH on exam, but patient and patient's father report that the patient has been experiencing intermittent AVH (see HPI for details).)  Ideas of Reference:-- (Patient denies paranoia, but patient's father reports patient has been experiencing episodes of mild intermittent paranoia (see HPI for details).)  Suicidal Thoughts:No  Homicidal Thoughts:No   Sensorium  Memory:Immediate Fair; Recent Fair; Remote Fair  Judgment:Fair  Insight:Fair   Executive Functions  Concentration:Fair  Attention Span:Fair  [place]  Language:Good   Psychomotor Activity  Psychomotor  Activity:Normal   Assets  Assets:Communication Skills; Desire for Improvement; Financial Resources/Insurance; Housing; Leisure Time; Physical Health; Resilience; Social Support; Transportation   Sleep  Sleep:Poor  Number of hours: 6   No data recorded  Physical Exam: Physical Exam Vitals reviewed.  Constitutional:      General: She is not in acute distress.    Appearance: She is not ill-appearing, toxic-appearing or diaphoretic.  HENT:     Head: Normocephalic and atraumatic.     Nose: Nose normal.  Eyes:     General:        Right eye: No discharge.        Left eye: No discharge.     Conjunctiva/sclera: Conjunctivae normal.  Cardiovascular:     Rate and Rhythm: Normal rate.  Pulmonary:     Effort: Pulmonary effort is normal. No respiratory distress.  Musculoskeletal:        General: Normal range of motion.     Cervical back: Normal range of motion.  Neurological:     General: No focal deficit present.     Mental Status: She is alert and oriented to person, place, and time.     Comments: No tremor noted.   Psychiatric:        Mood and Affect: Mood and affect normal.        Speech: Speech normal.        Behavior: Behavior is not agitated, slowed, aggressive, withdrawn, hyperactive or combative. Behavior is cooperative.        Thought Content: Thought content is not delusional. Thought content does not include homicidal or suicidal ideation.     Comments:  Patient denies AVH on exam, but patient and patient's father report that the patient has been experiencing intermittent AVH (see HPI for details). See HPI for details regarding paranoia.    Review of Systems  Constitutional:  Positive for fever and malaise/fatigue. Negative for chills, diaphoresis and weight loss.       Patient reports that she feels like she has a fever ([facility] 100[facility]  HENT:  Negative for congestion.   Respiratory:  Negative for cough and shortness of breath.   Cardiovascular:   Negative for chest pain and palpitations.  Gastrointestinal:  Negative for abdominal pain, constipation, diarrhea, nausea and vomiting.  Musculoskeletal:  Negative for joint pain and myalgias.  Neurological:  Negative for dizziness and headaches.  Psychiatric/Behavioral:  Positive for depression and hallucinations. Negative for memory loss, substance abuse and suicidal ideas. The patient has insomnia.   All other systems reviewed and are negative.  Vitals: Blood pressure 118/81, pulse (!) 107, temperature 100.2 F (37.9 C), temperature source Oral, resp. rate 18, SpO2 99 %. There is no height or weight on file to calculate BMI.  Musculoskeletal: Strength & Muscle Tone: within normal limits Gait & Station: normal Patient leans: N/A   [place] MSE Discharge Disposition for Follow up and Recommendations: Based on my evaluation, the patient does not appear to have an emergency medical condition at this time.  Based on patient's presentation, my evaluation of the patient, and collateral information obtained from patient's mother and father (see HPI for details), it was recommended by this provider for patient to be admitted to overnight [facility] continuous assessment at this time with psychiatry reevaluation on 12/04/2021.  Upon informing the patient of this recommendation, the patient declined this offer and stated that she wanted to go home instead.  Although patient does have history of psychosis and does appear to be experiencing intermittent symptoms of AVH and paranoia, these symptoms do not appear to be decompensated to the point that the patient is a danger to herself or others at this time.  Furthermore, patient denies SI or HI and is not actively psychotic on exam.  Patient denies history of any previous suicide attempts.  Patient verbally contracts for safety with this provider.  Patient's father denies access to firearms at home.  Patient's mother and father report that they do not have any safety  concerns regarding the patient at this time and that they do not have any suspicion that the patient would try to harm herself or others if she was to return home with them this evening.  Patient's mother and father state that they do not have any safety concerns regarding the patient returning home this evening.  Mother and father state that they feel safe having the patient return home with them this evening.  Patient's mother reports that she stays at home and will be able to monitor the patient at home.  Based on these factors, the patient is not an imminent danger/risk to herself or others at this time and thus, patient does not meet IVC criteria at this time.  Patient has another outpatient psychotropic medication management appointment on 12/09/2021 at 1600.  Recommend that patient take her current psychotropic medication regimen as prescribed until this appointment and also recommend that patient attend this appointment in order to discuss future potential psychotropic medication adjustments at the time of that appointment.  Patient does not have a therapist at this time.  [place] therapy resources (including [facility] [employer] health  outpatient) provided to the patient, patient's mother, and patient's father to utilize if they would like to initiate therapy for the patient.  Safety planning done at length with the patient, patient's father, and patient's mother regarding appropriate actions to take/resources to utilize [facility], nearest ED, 911, suicide prevention Lifeline) if the patient becomes suicidal or homicidal, if the patient's condition rapidly deteriorates/worsens/does not improve, or if the patient begins to experience a mental health crisis.  Of note, patient reports that she feels like she has a fever.  Patient's temperature 100.2 F here at the behavioral health urgent care.  Patient denies any additional physical symptoms at this time. Remainder of patient's vital signs  stable: BP 118/81, Pulse 107 (slightly tachycardic but not elevated to emergent value at this time), Resp 18, SPO2 99% on room air. This provider notified patient and patient's father of patient's temperature here at the behavioral health urgent care.  This provider also recommended to the patient and patient's father that patient's temperature be rechecked upon patient returning home this evening and that patient's temperature should be checked periodically over the next 24 hours. This provider also instructed patient and patient's father to monitor for any potential development of additional physical symptoms that might be indicative of physical illness and for patient to seek any necessary further medical care/evaluation at a facility such as an urgent care or emergency department if necessary depending on potential progression of patient's symptoms.  Patient and patient's father also instructed that the patient may take over-the-counter Tylenol at home for her temperature and/or if she develops additional physical symptoms such as myalgias, body aches, or headache.   Patient, patient's mother, and patient's father verbalized understanding and agreement of the overall plan and recommendations above, including safety plan.  All patient's, patient's mother's, and patient's father's questions answered and concerns addressed.  Patient discharged home with her mother and father.  Prescilla Sours, PA-C 12/04/2021, 2:51 AM

## 2021-12-03 NOTE — Progress Notes (Signed)
°   12/03/21 2024  Patient Reported Information  How Did You Hear About [place]? Family/Friend  What Is the Reason for Your Visit/Call Today? Pt reports, she came in because she had a fever, pt then responses that her behaviors are not good. Clinician asked the pt to described her behavors however she wanted her mother to. Per mother, the pt is not focusing, forgetful and confused. Pt mothers denies the pt has aggressive behaviors. Per mother, the hears noises however the pt denies, AVH. Per father the pt is sensitive, not focused and can't sleep. Pt also denies, SI, HI, self-injurious behaviors and access to weapons. Clinician asked what type of help is she seeking. pt replied behavioral help. Pt's parents denies concerns of SI, HI, self-injurious behaviors and access to weapons. Pt and parents can contract for safety.  What Do You Feel Would Help You the Most Today? Treatment for Depression or other mood problem;Medication(s)  Have You Recently Had Any Thoughts About Hurting Yourself? No  Are You Planning to Commit Suicide/Harm Yourself At This time? No  Have you Recently Had Thoughts About [place]? No  Are You Planning To Harm Someone At This Time? No  Have You Used Any Alcohol or Drugs in the Past 24 Hours? No  Do You Currently Have a Therapist/Psychiatrist? Yes  Name of Therapist/Psychiatrist [name], medication management. Per mother, pt takes her medications as prescribed.  [facility] Screening Triage Referral Assessment  Type of Contact Face-to-Face  Location of Assessment [facility] Assessment Services  Provider location [facility] Assessment Services  Collateral Involvement [place] and Jace Dowe, parents.  Patient Determined To Be At Risk for Harm To Self or Others Based on Review of Patient Reported Information or Presenting Complaint? No  Does Patient Present under Involuntary Commitment? No  Patient Currently Receiving the Following Services: Medication Management  Determination  of Need Routine (7 days)  Options For Referral Medication Management;Inpatient Hospitalization;BH Urgent Care;Facility-Based Crisis    Determination of need: Routine.    Vertell Novak, [place], [facility], [facility] Triage Specialist [phone]

## 2021-12-03 NOTE — Discharge Instructions (Addendum)
°  Discharge recommendations:  Patient is to take medications as prescribed. Please see information for follow-up appointment with psychiatry and therapy. Please follow up with your primary care provider for all medical related needs.   Therapy: We recommend that patient participate in individual therapy to address mental health concerns.  Medications: The patient is to contact a medical professional and/or outpatient provider to address any new side effects that develop. Patient should update outpatient providers of any new medications and/or medication changes.   Atypical antipsychotics: If you are prescribed an atypical antipsychotic, it is recommended that your height, weight, BMI, blood pressure, fasting lipid panel, and fasting blood sugar be monitored by your outpatient providers.  Safety:  The patient should abstain from use of illicit substances/drugs and abuse of any medications. If symptoms worsen or do not continue to improve or if the patient becomes actively suicidal or homicidal then it is recommended that the patient return to the closest hospital emergency department, the [facility], or call 911 for further evaluation and treatment. National Suicide Prevention Lifeline 1-800-SUICIDE or 706-867-3002.  About 988 988 offers 24/7 access to trained crisis counselors who can help people experiencing mental health-related distress. People can call or text 988 or chat 988lifeline.org for themselves or if they are worried about a loved one who may need crisis support.

## 2021-12-09 ENCOUNTER — Other Ambulatory Visit: Payer: Self-pay

## 2021-12-09 ENCOUNTER — Telehealth (HOSPITAL_BASED_OUTPATIENT_CLINIC_OR_DEPARTMENT_OTHER): Payer: 59 | Admitting: Medical

## 2021-12-09 DIAGNOSIS — Z79899 Other long term (current) drug therapy: Secondary | ICD-10-CM | POA: Diagnosis not present

## 2021-12-09 DIAGNOSIS — K7581 Nonalcoholic steatohepatitis (NASH): Secondary | ICD-10-CM | POA: Diagnosis not present

## 2021-12-09 DIAGNOSIS — R7303 Prediabetes: Secondary | ICD-10-CM

## 2021-12-09 DIAGNOSIS — F258 Other schizoaffective disorders: Secondary | ICD-10-CM

## 2021-12-09 DIAGNOSIS — F411 Generalized anxiety disorder: Secondary | ICD-10-CM

## 2021-12-09 DIAGNOSIS — E669 Obesity, unspecified: Secondary | ICD-10-CM

## 2021-12-09 DIAGNOSIS — F429 Obsessive-compulsive disorder, unspecified: Secondary | ICD-10-CM | POA: Diagnosis not present

## 2021-12-09 DIAGNOSIS — Z6835 Body mass index (BMI) 35.0-35.9, adult: Secondary | ICD-10-CM

## 2021-12-09 MED ORDER — ARIPIPRAZOLE 10 MG PO TABS: 10.0000 mg | ORAL_TABLET | Freq: Two times a day (BID) | ORAL | 2 refills | Status: DC

## 2021-12-09 MED ORDER — CLOMIPRAMINE HCL 25 MG PO CAPS: 25.0000 mg | ORAL_CAPSULE | Freq: Every day | ORAL | 0 refills | Status: DC

## 2021-12-09 MED ORDER — HYDROXYZINE PAMOATE 50 MG PO CAPS: 50.0000 mg | ORAL_CAPSULE | Freq: Three times a day (TID) | ORAL | 2 refills | Status: DC | PRN

## 2021-12-09 MED ORDER — HYDROXYZINE HCL 50 MG PO TABS: 50.0000 mg | ORAL_TABLET | Freq: Three times a day (TID) | ORAL | Status: DC | PRN

## 2021-12-09 NOTE — Progress Notes (Signed)
BH MD/PA/NP OP Progress Note     12/09/2021 4:20 PM Valerie [name]  MRN:  [id number] Musculoskeletal: Strength & Muscle Tone: within normal limits Gait & Station: normal Patient leans: N/A Virtual Visit via Video Note  I connected with Valerie [name] on 12/17/21 at  4:00 PM EST by a video enabled telemedicine application and verified that I am speaking with the correct person using two identifiers.  Location: Patient: Home with [name] Provider: [facility] [name]   I discussed the limitations of evaluation and management by telemedicine and the availability of in person appointments. The patient expressed understanding and agreed to proceed.   History of Present Illness:See EPIC note    Observations/Objective:See EPIC note   Assessment and Plan:See EPIC note   Follow Up Instructions:See EPIC note    I discussed the assessment and treatment plan with the patient. The patient was provided an opportunity to ask questions and all were answered. The patient agreed with the plan and demonstrated an understanding of the instructions.   The patient was advised to call back or seek an in-person evaluation if the symptoms worsen or if the condition fails to improve as anticipated.  I provided 20 minutes of non-face-to-face time during this encounter.   [name], PA-C   Chief Complaint:  Chief Complaint  Patient presents with   Follow-up   Medication Management   Altered Mental Status   HPI: 1 week fu for recurrence of psychosis/schizophrenia with OCD component and medication management At last visit: 12/03/2021 4:21 PM TODAY Valerie [name] and [name] return as scheduled but [name] reports Valerie [name] isnt responding as hoped. She continues to be agitated and inappropriate behaviors at times. She wants to drive. Mad at [name] because she is told she cannot drive right now.  and Plan: Recommend admission to [facility]for complete stabilization and medication management [place] texted to Mother Nurse will notify  Valerie [name] to call if needed. FU on discharge Addendum-[name] says problem with FMLA ? Names mixed up so daughter is beneficiary. Nurse will call 800 number  [facility] Discharge Disposition for Follow up and Recommendations: Based on my evaluation, the patient does not appear to have an emergency medical condition at this time.   Based on patient's presentation, my evaluation of the patient, and collateral information obtained from patient's mother and father (see HPI for details), it was recommended by this provider for patient to be admitted to overnight [facility] continuous assessment at this time with psychiatry reevaluation on 12/04/2021.  Upon informing the patient of this recommendation, the patient declined this offer and stated that she wanted to go home instead.  Although patient does have history of psychosis and does appear to be experiencing intermittent symptoms of AVH and paranoia, these symptoms do not appear to be decompensated to the point that the patient is a danger to herself or others at this time.  Furthermore, patient denies SI or HI and is not actively psychotic on exam.  Patient denies history of any previous suicide attempts.  Patient verbally contracts for safety with this provider.  Patient's father denies access to firearms at home.  Patient's mother and father report that they do not have any safety concerns regarding the patient at this time and that they do not have any suspicion that the patient would try to harm herself or others if she was to return home with them this evening.  Patient's mother and father state that they do not have any safety concerns regarding the patient returning  home this evening.  Mother and father state that they feel safe having the patient return home with them this evening.  Patient's mother reports that she stays at home and will be able to monitor the patient at home.  Based on these factors, the patient is not an imminent danger/risk to herself or others  at this time and thus, patient does not meet IVC criteria at this time.   Patient has another outpatient psychotropic medication management appointment on 12/09/2021 at 1600.  Recommend that patient take her current psychotropic medication regimen as prescribed until this appointment and also recommend that patient attend this appointment in order to discuss future potential psychotropic medication adjustments at the time of that appointment.   Patient does not have a therapist at this time.  [place] therapy resources (including [employer] health outpatient) provided to the patient, patient's mother, and patient's father to utilize if they would like to initiate therapy for the patient.   Safety planning done at length with the patient, patient's father, and patient's mother regarding appropriate actions to take/resources to utilize [facility], nearest ED, 911, suicide prevention Lifeline) if the patient becomes suicidal or homicidal, if the patient's condition rapidly deteriorates/worsens/does not improve, or if the patient begins to experience a mental health crisis.   Of note, patient reports that she feels like she has a fever.  Patient's temperature 100.2 F here at the behavioral health urgent care.  Patient denies any additional physical symptoms at this time. Remainder of patient's vital signs stable: BP 118/81, Pulse 107 (slightly tachycardic but not elevated to emergent value at this time), Resp 18, SPO2 99% on room air. This provider notified patient and patient's father of patient's temperature here at the behavioral health urgent care.  This provider also recommended to the patient and patient's father that patient's temperature be rechecked upon patient returning home this evening and that patient's temperature should be checked periodically over the next 24 hours. This provider also instructed patient and patient's father to monitor for any potential development of additional  physical symptoms that might be indicative of physical illness and for patient to seek any necessary further medical care/evaluation at a facility such as an urgent care or emergency department if necessary depending on potential progression of patient's symptoms.  Patient and patient's father also instructed that the patient may take over-the-counter Tylenol at home for her temperature and/or if she develops additional physical symptoms such as myalgias, body aches, or headache.    Patient, patient's mother, and patient's father verbalized understanding and agreement of the overall plan and recommendations above, including safety plan.   All patient's, patient's mother's, and patient's father's questions answered and concerns addressed.  Patient discharged home with her mother and father. Prescilla Sours, PA-C 12/04/2021, 2:51 AM Unfortunately Valerie [name] did not accept the recommendation she stay for observation.[name] says she does leave her at home for brief periods but does not fel she is ready to be on her own. sHE IS STILL REQUIRING aTIVAN FOR ANXIETY AND SLEEP.Valerie [name] has asked to drive herself but [name] has refused and says Valerie [name] GETS MAD AT HER. [name] also reports she was told her FMLA was not approved (Nurse reports she was told it was)?   Visit Diagnosis:    ICD-10-CM   1. Other schizoaffective disorders ([place])  F25.8     2. Obsessive-compulsive disorder, unspecified type  F42.9     3. Medication management  Z79.899     4. Steatohepatitis  K75.81  5. Class 2 obesity without serious comorbidity with body mass index (BMI) of 35.0 to 35.9 in adult, unspecified obesity type  E66.9    Z68.35     6. Prediabetes  R73.03     7. GAD (generalized anxiety disorder)  F41.1       Past Psychiatric History:    OUTPATIENT 03/20/2015 to Present  [place] Patient and father noted that 1st episode began in November with a few days of irritability, anxiety,  depression, and anger. This resolved.  She has had two more severe episodes since then between February and now where she is irritable, short tempered, appears to be responding to internal stimulation, reporting auditory hallucinations, cursing at family. Her grades have dropped from As and Bs to possibly needing Summer school.  Followed from 03/20/2015 to 02/24/2017 when she appeared in complete remission INPATIENT Date of Admission:  06/02/2017 Date of Discharge: 06/10/2017 Discharge Diagnoses:   Diagnosis Date Noted   Psychosis [F29] 06/03/2017   MDD (major depressive disorder), recurrent episode, severe ([place]) [X38.    Past Medical History:  Past Medical History:  Diagnosis Date   Anxiety    Depression    Fatigue    Headache    Psychosis ([place]) 06/03/2017   No past surgical history on file.  Family Psychiatric History:  NOVANT Mental illness Maternal Uncle     Family History:  NOVANT Medical History Relation Name Comments  Stroke Maternal Grandfather      Asthma Maternal Grandmother      Mental illness Maternal Uncle      Diabetes Paternal Grandmother      Hypertension Paternal Grandmother        Socioeconomic History   Marital status: Single      Spouse name: Not on file   Number of children: Not on file   Years of education: 13   Highest education level: Completing Aesthetics Course at [employer]  Occupational History   Student  Tobacco Use   Smoking status: Never   Smokeless tobacco: Never  Vaping Use   Vaping Use: Never used  Substance and Sexual Activity   Alcohol use: No   Drug use: No   Sexual activity: Not Currently      Birth control/protection: Pill, Abstinence  Other Topics Concern   Not on file  Social History Narrative   Not on file       Allergies:  Allergies  Allergen Reactions   Sulfa Antibiotics Hives and Itching    Hives on face    Metabolic Disorder Labs: Lab Results  Component Value Date   HGBA1C 5.3 06/03/2017   MPG  105.41 06/03/2017   Lab Results  Component Value Date   PROLACTIN 79.1 (H) 06/04/2017   Lab Results  Component Value Date   CHOL 172 (H) 06/04/2017   TRIG 144 06/04/2017   HDL 69 06/04/2017   CHOLHDL 2.5 06/04/2017   VLDL 29 06/04/2017   LDLCALC 74 06/04/2017   Lab Results  Component Value Date   TSH 2.863 06/03/2017    Therapeutic Level Labs: No results found for: LITHIUM No results found for: VALPROATE No components found for:  CBMZ  Current Medications: Current Outpatient Medications  Medication Sig Dispense Refill   hydrOXYzine (VISTARIL) 50 MG capsule Take 1 capsule (50 mg total) by mouth 3 (three) times daily as needed for anxiety. 30 capsule 2   ARIPiprazole (ABILIFY) 10 MG tablet Take 1 tablet (10 mg total) by mouth in the  morning and at bedtime. 60 tablet 2   ascorbic acid (VITAMIN C) 1000 MG tablet Take by mouth.     Calcium Citrate-Vitamin D 315-250 MG-UNIT TABS Take by mouth.     ciprofloxacin-dexamethasone (CIPRODEX) OTIC suspension SMARTSIG:In Ear(s)     clomiPRAMINE (ANAFRANIL) 25 MG capsule Take 1 capsule (25 mg total) by mouth at bedtime. 90 capsule 0   ketoconazole (NIZORAL) 2 % cream      LORazepam (ATIVAN) 2 MG tablet TAKE 1 TABLET BY MOUTH THREE TIMES DAILY AS NEEDED FOR ANXIETY OR  SLEEP  FOR  UP  TO  7  DAYS 21 tablet 0   norgestimate-ethinyl estradiol (ORTHO-CYCLEN) 0.25-35 MG-MCG tablet Take 1 tablet by mouth daily.     Omega-3 Fatty Acids (FISH OIL) 1000 MG CAPS Take by mouth.     omeprazole (PRILOSEC) 20 MG capsule Take 20 mg by mouth daily.     omeprazole (PRILOSEC) 20 MG capsule Take by mouth.     simethicone (GAS-X) 80 MG chewable tablet Chew 1 tablet (80 mg total) by mouth every 6 (six) hours as needed for flatulence. 30 tablet 0   tiZANidine (ZANAFLEX) 2 MG tablet Take 1-2 tablets at night as needed by mouth before bed for back pain or muscle spasms     traZODone (DESYREL) 50 MG tablet Take 1 tablet (50 mg total) by mouth at bedtime and may  repeat dose one time if needed. 60 tablet 1   No current facility-administered medications for this visit.   Musculoskeletal: Strength & Muscle Tone: Telepsych visit-Grossly normal Musculoskeletal and cranial nerve inspections Gait & Station: NA Patient leans: N/A  Psychiatric Specialty Exam: Review of Systems  Constitutional:  Positive for activity change and fever (See [place] note). Negative for appetite change, chills, diaphoresis, fatigue and unexpected weight change.  Neurological:  Negative for dizziness, tremors, seizures, syncope, facial asymmetry, speech difficulty, weakness, light-headedness, numbness and headaches.  Psychiatric/Behavioral:  Positive for agitation, decreased concentration and sleep disturbance. Negative for behavioral problems, confusion, dysphoric mood, hallucinations, self-injury and suicidal ideas. The patient is nervous/anxious. The patient is not hyperactive.    There were no vitals taken for this visit.There is no height or weight on file to calculate BMI.My Chart visit  General Appearance: Casual  Eye Contact:  Fair  Speech:  Clear and Coherent  Volume:  Normal  Mood:   Full range  Affect:  Appropriate and Congruent  Thought Process:  Coherent, Goal Directed, and Descriptions of Associations: Intact  Orientation:  Full (Time, Place, and Person)  Thought Content: WDL   Suicidal Thoughts:  No  Homicidal Thoughts:  No  Memory:  Negative  Judgement:  Other:  Improving  Insight:  Lacking  Psychomotor Activity:  Negative  Concentration:  Concentration: Good and Attention Span: Fair  Recall:  [place] of Knowledge:  WDL  Language: Good  Akathisia:  No  Handed:  Right  AIMS (if indicated): not done  Assets:  Desire for Improvement Financial Resources/Insurance Housing Resilience Social Support Transportation Vocational/Educational  ADL's:  Impaired-[name] reports still ready to be own/drive  Cognition: Impaired,  Mild and Moderate  Sleep:   with  medications   Screenings: [place] Office Visit from 09/06/2019 in [place] Office Visit from 07/13/2018 in [place] Office Visit from 01/19/2018 in [facility] [place] Office Visit from 10/27/2017 in [place] Admission (Discharged) from 06/02/2017 in  [facility] CHILD/ADOL[facility]  AIMS Total Score 0 0 0 0 0      AUDIT    Flowsheet Row Admission (Discharged) from 06/02/2017 in [facility] CHILD/ADOLES [facility]  Alcohol Use Disorder Identification Test Final Score (AUDIT) 0      Flowsheet Row ED from 12/03/2021 in [facility] No Risk        Assessment and Plan: Slow improvement. Will reduce abilfyto 15 mg attempting to gether stable at 10 mg as before Consider switching to Rexulti due to oesity issues if Abilify cant restabilize.Consider long acting antipsychotic if Abilify restabilizes? FU 1 week Med nurse to reinvestigate [name]'s FMLA   Collaboration of Care: Collaboration of Care: Medication Management AEB    Patient/Guardian was advised Release of Information must be obtained prior to any record release in order to collaborate their care with an outside provider. Patient/Guardian was advised if they have not already done so to contact the registration department to sign all necessary forms in order for [place] to release information regarding their care.   Consent: Patient/Guardian gives verbal consent for treatment and assignment of benefits for services provided during this visit. Patient/Guardian expressed understanding and agreed to proceed.    [name], PA-C 4:20 PM 12/09/2021

## 2021-12-10 ENCOUNTER — Telehealth (HOSPITAL_COMMUNITY): Payer: Self-pay | Admitting: Psychiatry

## 2021-12-10 NOTE — Telephone Encounter (Signed)
D:  [name], PA-C requesting a female, spanish speaking therapist for pt.  Also, requesting family education re: Schizophrenia for pt and family.  A:  Placed call to pt in order to provide her with the information.  Pt requesting the case mgr to put everything in the mail.  Requesting it not to be sent via email because she just got a new email.  Case manager asked if her mother was home; pt states she is currently at work.  "You can put everything in the mail for her also."  Case manager will type up all the information and put it in the mail and mail separately to pt and mom.  Spanish-Speaking Therapists:  1) Andi Hence, [place]; Dominica Severin, PhD at [facility] [phone]; [facility] [phone]; Sharalyn Ink [phone].  Informed pt she can always contact her insurance company for approved, local, spanish-speaking therapists. Pt & Family Education:  [place] ([email or web address]) or [employer]. Inform [name], PA-C. R:  Pt receptive.

## 2021-12-14 ENCOUNTER — Telehealth (HOSPITAL_COMMUNITY): Payer: Self-pay | Admitting: Medical

## 2021-12-14 NOTE — Telephone Encounter (Signed)
Call top pt Mother

## 2021-12-15 ENCOUNTER — Telehealth (HOSPITAL_COMMUNITY): Payer: Self-pay

## 2021-12-15 NOTE — Telephone Encounter (Signed)
WRITER CALLED THE [facility] [phone]) TO FOLLOWUP ON GLORIA Mizner'S LEAVE CLAIM # [id number] TO HELP WITH Novi Vea'S CARE.  S/W CAMILLE WHO STATED THAT THE CLAIM HAS BEEN APPROVED AS OF 12/08/21. CAMILLE ALSO STATED THAT BOTH GLORIA AND WE WILL BE RECEIVING THE APPROVAL IN WRITING

## 2021-12-17 ENCOUNTER — Encounter (HOSPITAL_COMMUNITY): Payer: Self-pay | Admitting: Medical

## 2021-12-17 ENCOUNTER — Other Ambulatory Visit: Payer: Self-pay

## 2021-12-17 ENCOUNTER — Telehealth (HOSPITAL_BASED_OUTPATIENT_CLINIC_OR_DEPARTMENT_OTHER): Payer: 59 | Admitting: Medical

## 2021-12-17 DIAGNOSIS — F429 Obsessive-compulsive disorder, unspecified: Secondary | ICD-10-CM

## 2021-12-17 DIAGNOSIS — Z79899 Other long term (current) drug therapy: Secondary | ICD-10-CM

## 2021-12-17 DIAGNOSIS — F411 Generalized anxiety disorder: Secondary | ICD-10-CM

## 2021-12-17 DIAGNOSIS — R7303 Prediabetes: Secondary | ICD-10-CM

## 2021-12-17 DIAGNOSIS — E669 Obesity, unspecified: Secondary | ICD-10-CM

## 2021-12-17 DIAGNOSIS — K7581 Nonalcoholic steatohepatitis (NASH): Secondary | ICD-10-CM

## 2021-12-17 DIAGNOSIS — F258 Other schizoaffective disorders: Secondary | ICD-10-CM

## 2021-12-17 DIAGNOSIS — Z6835 Body mass index (BMI) 35.0-35.9, adult: Secondary | ICD-10-CM

## 2021-12-17 NOTE — Progress Notes (Signed)
[facility] MD/PA/NP OP Progress Note  12/17/2021 4:15 PM Valerie [name]  MRN:  [id number] Virtual Visit via Video Note  I connected with [name] on 12/17/21 at  4:00 PM EST by a video enabled telemedicine application and verified that I am speaking with the correct person using two identifiers.  Location: Patient: At home with Mom Provider: Working Virtually from home out of [facility] OP [employer]   I discussed the limitations of evaluation and management by telemedicine and the availability of in person appointments. The patient expressed understanding and agreed to proceed.  History of Present Illness:See EPIC note    Observations/Objective:See EPIC note   Assessment and Plan:See EPIC note   Follow Up Instructions:See EPIC note    I discussed the assessment and treatment plan with the patient. The patient was provided an opportunity to ask questions and all were answered. The patient agreed with the plan and demonstrated an understanding of the instructions.   The patient was advised to call back or seek an in-person evaluation if the symptoms worsen or if the condition fails to improve as anticipated.  I provided 20 minutes of non-face-to-face time during this encounter.   [name], PA-C   Chief Complaint:  Chief Complaint  Patient presents with   Follow-up   Schizophrenia   Medication Management   Insomnia   HPI: Valerie [name] is seen 8 days since her last visit for her psychotic disorder/(Schizophrenia vs Schizoaffective) OCD and Anxiety. She continues to improve .[name] is doing well with Vistaril in place of Lorazepam ,15 mg of Abilify and Anafranil and Trazodone HS. She is sleeping 7-8 hrs now. Crying spells have stopped.Mom has had her FMLA approved.Request for counseling has been submitted.  Visit Diagnosis:    ICD-10-CM   1. Other schizoaffective disorders ([place])  F25.8     2. Obsessive-compulsive disorder, unspecified type  F42.9     3. Medication management   Z79.899     4. Steatohepatitis  K75.81     5. Class 2 obesity without serious comorbidity with body mass index (BMI) of 35.0 to 35.9 in adult, unspecified obesity type  E66.9    Z68.35     6. Prediabetes  R73.03     7. GAD (generalized anxiety disorder)  F41.1       Past Psychiatric History:  OUTPATIENT 03/20/2015 to Present  [place] Patient and father noted that 1st episode began in November with a few days of irritability, anxiety, depression, and anger. This resolved.  She has had two more severe episodes since then between February and now where she is irritable, short tempered, appears to be responding to internal stimulation, reporting auditory hallucinations, cursing at family. Her grades have dropped from As and Bs to possibly needing Summer school.  Followed from 03/20/2015 to 02/24/2017 when she appeared in complete remission Returned to [facility] OP aftercare for recurrence 05/2017 to present INPATIENT Date of Admission:  06/02/2017 Date of Discharge: 06/10/2017 Discharge Diagnoses:   Diagnosis Date Noted   Psychosis [F29] 06/03/2017   MDD (major depressive disorder), recurrent episode, severe ([place]) [name]       Past Medical History:  Past Medical History:  Diagnosis Date   Anxiety    Depression    Fatigue    Headache    Psychosis ([place]) 06/03/2017   No past surgical history on file.  Family Psychiatric History:  NOVANT Mental illness Maternal Uncle     Family History:  [place] History Relation Name  Comments  Stroke Maternal Grandfather      Asthma Maternal Grandmother      Mental illness Maternal Uncle      Diabetes Paternal Grandmother      Hypertension Paternal Grandmother     Social History:   Marital status: Single      Spouse name: Not on file   Number of children: Not on file   Years of education: 13   Highest education level: Completing Aesthetics Course at [employer]  Occupational History    Student  Tobacco Use   Smoking status: Never   Smokeless tobacco: Never  Vaping Use   Vaping Use: Never used  Substance and Sexual Activity   Alcohol use: No   Drug use: No   Sexual activity: Not Currently      Birth control/protection: Pill, Abstinence  Other Topics Concern   Not on file  Social History Narrative   Not on file      Allergies:  Allergies  Allergen Reactions   Sulfa Antibiotics Hives and Itching    Hives on face    Metabolic Disorder Labs: Lab Results  Component Value Date   HGBA1C 5.3 06/03/2017   MPG 105.41 06/03/2017   Lab Results  Component Value Date   PROLACTIN 79.1 (H) 06/04/2017   Lab Results  Component Value Date   CHOL 172 (H) 06/04/2017   TRIG 144 06/04/2017   HDL 69 06/04/2017   CHOLHDL 2.5 06/04/2017   VLDL 29 06/04/2017   LDLCALC 74 06/04/2017   Lab Results  Component Value Date   TSH 2.863 06/03/2017   Current Medications: Current Outpatient Medications  Medication Sig Dispense Refill   ARIPiprazole (ABILIFY) 10 MG tablet Take 1 tablet (10 mg total) by mouth in the morning and at bedtime. 60 tablet 2   ascorbic acid (VITAMIN C) 1000 MG tablet Take by mouth.     Calcium Citrate-Vitamin D 315-250 MG-UNIT TABS Take by mouth.     ciprofloxacin-dexamethasone (CIPRODEX) OTIC suspension SMARTSIG:In Ear(s)     clomiPRAMINE (ANAFRANIL) 25 MG capsule Take 1 capsule (25 mg total) by mouth at bedtime. 90 capsule 0   hydrOXYzine (VISTARIL) 50 MG capsule Take 1 capsule (50 mg total) by mouth 3 (three) times daily as needed for anxiety. 30 capsule 2   ketoconazole (NIZORAL) 2 % cream      LORazepam (ATIVAN) 2 MG tablet TAKE 1 TABLET BY MOUTH THREE TIMES DAILY AS NEEDED FOR ANXIETY OR  SLEEP  FOR  UP  TO  7  DAYS 21 tablet 0   norgestimate-ethinyl estradiol (ORTHO-CYCLEN) 0.25-35 MG-MCG tablet Take 1 tablet by mouth daily.     Omega-3 Fatty Acids (FISH OIL) 1000 MG CAPS Take by mouth.     omeprazole (PRILOSEC) 20 MG capsule Take 20 mg by  mouth daily.     omeprazole (PRILOSEC) 20 MG capsule Take by mouth.     simethicone (GAS-X) 80 MG chewable tablet Chew 1 tablet (80 mg total) by mouth every 6 (six) hours as needed for flatulence. 30 tablet 0   tiZANidine (ZANAFLEX) 2 MG tablet Take 1-2 tablets at night as needed by mouth before bed for back pain or muscle spasms     traZODone (DESYREL) 50 MG tablet Take 1 tablet (50 mg total) by mouth at bedtime and may repeat dose one time if needed. 60 tablet 1   No current facility-administered medications for this visit.     Musculoskeletal: Strength & Muscle Tone: Telepsych visit-Grossly normal Musculoskeletal and  cranial nerve inspections Gait & Station: NA Patient leans: N/A   Psychiatric Specialty Exam: Review of Systems  Constitutional:  Negative for activity change, appetite change, chills, diaphoresis, fatigue, fever and unexpected weight change.  Neurological:  Negative for dizziness, tremors, seizures, syncope, facial asymmetry, speech difficulty, weakness, light-headedness, numbness and headaches.  Psychiatric/Behavioral:  Negative for agitation, behavioral problems, confusion, decreased concentration, dysphoric mood, hallucinations, self-injury, sleep disturbance and suicidal ideas. The patient is not nervous/anxious and is not hyperactive.    There were no vitals taken for this visit.There is no height or weight on file to calculate BMI.My Chart visit  General Appearance: Neat and Well Groomed  Eye Contact:  Good  Speech:  Clear and Coherent  Volume:  Normal  Mood:  Euthymic  Affect:  Appropriate and Congruent  Thought Process:  Coherent, Goal Directed, and Descriptions of Associations: Intact  Orientation:  Full (Time, Place, and Person)  Thought Content: WDL and Logical   Suicidal Thoughts:  No  Homicidal Thoughts:  No  Memory:  Negative  Judgement:  Other:  Improved  Insight:   Improved  Psychomotor Activity:  Normal and Limited view  Concentration:   Concentration: Good and Attention Span: Good  Recall:  Negative  Fund of Knowledge:  WDL  Language: Good Bilingual  Akathisia:  No  Handed:  Right  AIMS (if indicated): done  Assets:  Desire for Improvement Financial Resources/Insurance Housing Resilience Social Support Transportation Vocational/Educational  ADL's:  Intact  Cognition: WNL  Sleep:   Improved with medications   Screenings: [place] Office Visit from 09/06/2019 in [place] Office Visit from 07/13/2018 in [place] Office Visit from 01/19/2018 in [place] Office Visit from 10/27/2017 in [place] Admission (Discharged) from 06/02/2017 in [place] Total Score 0 0 0 0 0      AUDIT    Flowsheet Row Admission (Discharged) from 06/02/2017 in [place] CHILD/ADOLES 100B  Alcohol Use Disorder Identification Test Final Score (AUDIT) 0      Flowsheet [place] ED from 12/03/2021 in [place] No Risk        Assessment and Plan: Continues to show improvement .Will try 10 mg Abilify otherwise no changes FU 1 week. Mentioned LA Abilify if she is stable on it  Collaboration of Care: Collaboration of Care: Medication Management AEB    Patient/Guardian was advised Release of Information must be obtained prior to any record release in order to collaborate their care with an outside provider. Patient/Guardian was advised if they have not already done so to contact the registration department to sign all necessary forms in order for [place] to release information regarding their care.   Consent: Patient/Guardian gives verbal consent for treatment and assignment of benefits for services provided during this visit. Patient/Guardian expressed  understanding and agreed to proceed.    [name], PA-C 12/17/2021, 4:15 PM

## 2021-12-24 ENCOUNTER — Other Ambulatory Visit: Payer: Self-pay

## 2021-12-24 ENCOUNTER — Encounter (HOSPITAL_COMMUNITY): Payer: Self-pay | Admitting: Medical

## 2021-12-24 ENCOUNTER — Telehealth (HOSPITAL_BASED_OUTPATIENT_CLINIC_OR_DEPARTMENT_OTHER): Payer: 59 | Admitting: Medical

## 2021-12-24 DIAGNOSIS — F258 Other schizoaffective disorders: Secondary | ICD-10-CM

## 2021-12-24 DIAGNOSIS — E669 Obesity, unspecified: Secondary | ICD-10-CM

## 2021-12-24 DIAGNOSIS — Z79899 Other long term (current) drug therapy: Secondary | ICD-10-CM | POA: Diagnosis not present

## 2021-12-24 DIAGNOSIS — F429 Obsessive-compulsive disorder, unspecified: Secondary | ICD-10-CM

## 2021-12-24 DIAGNOSIS — R7303 Prediabetes: Secondary | ICD-10-CM

## 2021-12-24 DIAGNOSIS — Z6835 Body mass index (BMI) 35.0-35.9, adult: Secondary | ICD-10-CM

## 2021-12-24 DIAGNOSIS — F411 Generalized anxiety disorder: Secondary | ICD-10-CM

## 2021-12-24 DIAGNOSIS — K7581 Nonalcoholic steatohepatitis (NASH): Secondary | ICD-10-CM

## 2021-12-24 MED ORDER — TRAZODONE HCL 50 MG PO TABS: 50.0000 mg | ORAL_TABLET | Freq: Every evening | ORAL | 1 refills | Status: DC | PRN

## 2021-12-24 NOTE — Progress Notes (Signed)
[facility] MD/PA/NP OP Progress Note  3/2/20233:51 PM Valerie [name]  MRN:  [id number] Virtual Visit via Video Note  I connected with Valerie [name] on 12/31/21 at  3:30 PM EST by a video enabled telemedicine application and verified that I am speaking with the correct person using two identifiers.  Location: Patient: At home on computer Provider: [place]   I discussed the limitations of evaluation and management by telemedicine and the availability of in person appointments. The patient expressed understanding and agreed to proceed.   History of Present Illness:See EPIC note    Observations/Objective:See EPIC note   Assessment and Plan:See EPIC note   Follow Up Instructions:See EPIC note    I discussed the assessment and treatment plan with the patient. The patient was provided an opportunity to ask questions and all were answered. The patient agreed with the plan and demonstrated an understanding of the instructions.   The patient was advised to call back or seek an in-person evaluation if the symptoms worsen or if the condition fails to improve as anticipated.  I provided 20 minutes of non-face-to-face time during this encounter.   [name], PA-C   Chief Complaint:  Chief Complaint  Patient presents with   Follow-up   Medication Management   Schizophrenia   Anxiety   Insomnia   HPI: Valerie [name] is seen for weekly fu after recurrence of her psychotic disorder (? Schizoaffective vs schizophrenia disorder) related to her not taking her medications and coincidental attempt to taper her off Abilify.She is now 71/2 weeks post recurrence. Both she and her mother report full abatement of psychosis and emotional labiality.at 10 mg of Abilify (She wqas taoered from 58m ) And Vistaril PRN. She continues with her HS Clomipt ramine. She has been taking Trazodone for sleep as needed. Mentioned injectable abilify but she is reluctant at this time. She has not heard about a  cSocial worker  Visit Diagnosis:    ICD-10-CM   1. Other schizoaffective disorders ([place]  F25.8     2. Obsessive-compulsive disorder, unspecified type  F42.9     3. Medication management  Z79.899     4. Steatohepatitis  K75.81     5. Class 2 obesity without serious comorbidity with body mass index (BMI) of 35.0 to 35.9 in adult, unspecified obesity type  E66.9    Z68.35     6. Prediabetes  R73.03     7. GAD (generalized anxiety disorder)  F41.1       Past Psychiatric History:  OUTPATIENT 03/20/2015 to Present  [place] noted that 1st episode began in November with a few days of irritability, anxiety, depression, and anger. This resolved.  She has had two more severe episodes since then between February and now where she is irritable, short tempered, appears to be responding to internal stimulation, reporting auditory hallucinations, cursing at family. Her grades have dropped from As and Bs to possibly needing Summer school.  Followed from 03/20/2015 to 02/24/2017 when she appeared in complete remission [facility]/2017 to present INPATIENT Date of Admission:  06/02/2017 Date of Discharge: 06/10/2017 Discharge Diagnoses:   Diagnosis Date Noted   Psychosis [F29] 06/03/2017   MDD (major depressive disorder), recurrent episode, severe ([place] [[X41   Past Medical History:  Past Medical History:  Diagnosis Date   Anxiety    Depression    Fatigue    Headache    Psychosis ([place] 06/03/2017  History reviewed. No pertinent surgical history.  Family Psychiatric History:  NOVANT Mental illness Maternal Uncle   Family History:  NOVANT Medical History Relation Name Comments  Stroke Maternal Grandfather      Asthma Maternal Grandmother      Mental illness Maternal Uncle      Diabetes Paternal Grandmother      Hypertension Paternal Grandmother    Social History:        Marital status:  Single      Spouse name: Not on file   Number of children: Not on file   Years of education: 13   Highest education level: Completing Aesthetics Course at [employer]  Occupational History   Student  Tobacco Use   Smoking status: Never   Smokeless tobacco: Never  Vaping Use   Vaping Use: Never used  Substance and Sexual Activity   Alcohol use: No   Drug use: No   Sexual activity: Not Currently      Birth control/protection: Pill, Abstinence  Other Topics Concern   Not on file  Social History Narrative   Not on file    Allergies:  Allergies  Allergen Reactions   Sulfa Antibiotics Hives and Itching    Hives on face    Metabolic Disorder Labs: Lab Results  Component Value Date   HGBA1C 5.3 06/03/2017   MPG 105.41 06/03/2017   Lab Results  Component Value Date   PROLACTIN 79.1 (H) 06/04/2017   Lab Results  Component Value Date   CHOL 172 (H) 06/04/2017   TRIG 144 06/04/2017   HDL 69 06/04/2017   CHOLHDL 2.5 06/04/2017   VLDL 29 06/04/2017   LDLCALC 74 06/04/2017   Lab Results  Component Value Date   TSH 2.863 06/03/2017    Therapeutic Level Labs:NA  Current Medications: Current Outpatient Medications  Medication Sig Dispense Refill   ARIPiprazole (ABILIFY) 10 MG tablet Take 1 tablet (10 mg total) by mouth in the morning and at bedtime. 60 tablet 2   ascorbic acid (VITAMIN C) 1000 MG tablet Take by mouth.     Calcium Citrate-Vitamin D 315-250 MG-UNIT TABS Take by mouth.     ciprofloxacin-dexamethasone (CIPRODEX) OTIC suspension SMARTSIG:In Ear(s)     clomiPRAMINE (ANAFRANIL) 25 MG capsule Take 1 capsule (25 mg total) by mouth at bedtime. 90 capsule 0   hydrOXYzine (VISTARIL) 50 MG capsule Take 1 capsule (50 mg total) by mouth 3 (three) times daily as needed for anxiety. 30 capsule 2   ketoconazole (NIZORAL) 2 % cream      LORazepam (ATIVAN) 2 MG tablet TAKE 1 TABLET BY MOUTH THREE TIMES DAILY AS NEEDED FOR ANXIETY OR  SLEEP  FOR  UP  TO  7  DAYS 21  tablet 0   norgestimate-ethinyl estradiol (ORTHO-CYCLEN) 0.25-35 MG-MCG tablet Take 1 tablet by mouth daily.     Omega-3 Fatty Acids (FISH OIL) 1000 MG CAPS Take by mouth.     omeprazole (PRILOSEC) 20 MG capsule Take 20 mg by mouth daily.     omeprazole (PRILOSEC) 20 MG capsule Take by mouth.     simethicone (GAS-X) 80 MG chewable tablet Chew 1 tablet (80 mg total) by mouth every 6 (six) hours as needed for flatulence. 30 tablet 0   tiZANidine (ZANAFLEX) 2 MG tablet Take 1-2 tablets at night as needed by mouth before bed for back pain or muscle spasms     traZODone (DESYREL) 50 MG tablet Take 1 tablet (50 mg total) by mouth at bedtime and  may repeat dose one time if needed. 60 tablet 1   No current facility-administered medications for this visit.     Musculoskeletal: Strength & Muscle Tone: Telepsych visit-Grossly normal Musculoskeletal and cranial nerve inspections Gait & Station: NA Patient leans: N/A  Psychiatric Specialty Exam: Review of Systems  Constitutional:  Positive for activity change (Improved mental status. Now working out/psychosis has cleared). Negative for appetite change, chills, diaphoresis, fatigue, fever and unexpected weight change.  HENT:  Negative for congestion, dental problem, ear pain, hearing loss, mouth sores, nosebleeds, postnasal drip, rhinorrhea, sinus pressure, sinus pain, sneezing, sore throat, tinnitus, trouble swallowing and voice change.   Eyes:  Negative for photophobia, pain, discharge, redness, itching and visual disturbance.  Respiratory: Negative.    Cardiovascular: Negative.   Gastrointestinal: Negative.   Genitourinary:  Negative for decreased urine volume, difficulty urinating, dyspareunia, dysuria, enuresis, flank pain, frequency, genital sores, hematuria, menstrual problem, pelvic pain, urgency, vaginal bleeding, vaginal discharge and vaginal pain.  Musculoskeletal:  Negative for arthralgias, back pain, gait problem, joint swelling, myalgias,  neck pain and neck stiffness.  Skin:  Negative for color change, pallor, rash and wound.  Neurological:  Negative for tremors, seizures, syncope, facial asymmetry, speech difficulty, weakness, light-headedness, numbness and headaches.  Psychiatric/Behavioral:  Negative for agitation, behavioral problems, confusion, decreased concentration, dysphoric mood, hallucinations, self-injury, sleep disturbance and suicidal ideas. The patient is not nervous/anxious and is not hyperactive.    There were no vitals taken for this visit.There is no height or weight on file to calculate BMI.My Chart visit  General Appearance: Well Groomed  Eye Contact:  Good  Speech:  Clear and Coherent and Normal Rate  Volume:  Normal  Mood:  Euthymic  Affect:   Smiling  Thought Process:  Coherent, Goal Directed, and Descriptions of Associations: Intact  Orientation:  Full (Time, Place, and Person)  Thought Content: WDL and Logical   Suicidal Thoughts:  No  Homicidal Thoughts:  No  Memory:  Negative  Judgement:  Intact  Insight:  Present  Psychomotor Activity:  Normal  Concentration:  Concentration: Good and Attention Span: Good  Recall:  Good  Fund of Knowledge:  WDL  Language: Good  Akathisia:  NA  Handed:  Right  AIMS (if indicated): NA  Assets:  Desire for Improvement Financial Resources/Insurance Housing Resilience Social Support Talents/Skills Transportation Vocational/Educational  ADL's:  Intact  Cognition: WNL  Sleep:   Improved   Screenings: [place] Office Visit from 09/06/2019 in [place] Office Visit from 07/13/2018 in [place] Office Visit from 01/19/2018 in [place] Office Visit from 10/27/2017 in [place] Admission (Discharged) from 06/02/2017 in [place] Total Score 0  0 0 0 0      AUDIT    Flowsheet Row Admission (Discharged) from 06/02/2017 in [place] CHILD/ADOLES 100B  Alcohol Use Disorder Identification Test Final Score (AUDIT) 0      Flowsheet [place] ED from 12/03/2021 in [place] No Risk        Assessment: Crisis has passed     and Plan: Continue current medications. Referred to Cordella Register LCSW for [facility] at [facility].FU 1 month-soone if needed   [name], PA-C 3:51 PM 12/24/2021

## 2022-01-28 ENCOUNTER — Telehealth (HOSPITAL_BASED_OUTPATIENT_CLINIC_OR_DEPARTMENT_OTHER): Payer: 59 | Admitting: Medical

## 2022-01-28 ENCOUNTER — Encounter (HOSPITAL_COMMUNITY): Payer: Self-pay | Admitting: Medical

## 2022-01-28 DIAGNOSIS — F258 Other schizoaffective disorders: Secondary | ICD-10-CM

## 2022-01-28 DIAGNOSIS — K7581 Nonalcoholic steatohepatitis (NASH): Secondary | ICD-10-CM | POA: Diagnosis not present

## 2022-01-28 DIAGNOSIS — Z79899 Other long term (current) drug therapy: Secondary | ICD-10-CM

## 2022-01-28 DIAGNOSIS — E669 Obesity, unspecified: Secondary | ICD-10-CM

## 2022-01-28 DIAGNOSIS — Z6835 Body mass index (BMI) 35.0-35.9, adult: Secondary | ICD-10-CM

## 2022-01-28 DIAGNOSIS — F429 Obsessive-compulsive disorder, unspecified: Secondary | ICD-10-CM | POA: Diagnosis not present

## 2022-01-28 DIAGNOSIS — R7303 Prediabetes: Secondary | ICD-10-CM

## 2022-01-28 NOTE — Progress Notes (Signed)
[place] MD/PA/NP OP Progress Note ? ?01/28/2022 4:54 PM ?Valerie [name]  ?MRN:  [id number] ?Virtual Visit via Video Note ? ?I connected with Valerie [name] on 01/28/22 at  4:00 PM EDT by a video enabled telemedicine application and verified that I am speaking with the correct person using two identifiers. ? ?Location: ?Patient: Home ?Provider: [place] ?  ?I discussed the limitations of evaluation and management by telemedicine and the availability of in person appointments. The patient expressed understanding and agreed to proceed. ? ? ?History of Present Illness:See EPIC note ? ?  ?Observations/Objective:See EPIC note ? ? ?Assessment and Plan:See EPIC note ? ? ?Follow Up Instructions:See EPIC note  ?I discussed the assessment and treatment plan with the patient. The patient was provided an opportunity to ask questions and all were answered. The patient agreed with the plan and demonstrated an understanding of the instructions. ?  ?The patient was advised to call back or seek an in-person evaluation if the symptoms worsen or if the condition fails to improve as anticipated. ? ?I provided 20 minutes of non-face-to-face time during this encounter. ? ? ?[name], PA-C ? ? ?Chief Complaint:  ?Chief Complaint  ?Patient presents with  ? Follow-up  ? Schizophrenia  ? Obesity  ? ?HPI:  ?Valerie [name] returns for scheduled FU for her Schizophrenia after recent psychotic episode related to her not taking her medications.Today she reports a full recovery/remission from her psychosis and ZMom confirms same.  ?They continue to have concerns about Valerie [name]'s FMLA saying it has been rejected. However paperwork sent to office dated 12/10/2021 says there is additional information needed regarging [name] disability /inability to perform ADLs (3). ? ?Visit Diagnosis:  ?  ICD-10-CM   ?1. Other schizoaffective disorders ([place])  F25.8   ?  ?2. Obsessive-compulsive disorder, unspecified type  F42.9   ?  ?3. Medication management  Z79.899   ?  ?4.  Steatohepatitis  K75.81   ?  ?5. Class 2 obesity without serious comorbidity with body mass index (BMI) of 35.0 to 35.9 in adult, unspecified obesity type  E66.9   ? Z68.35   ?  ?6. Prediabetes  R73.03   ?  ? ? ?Past Psychiatric History:  ?OUTPATIENT ?03/20/2015 to Present ? [place] ?Patient and father noted that 1st episode began in November with a few days of irritability, anxiety, depression, and anger. This resolved.  She has had two more severe episodes since then between February and now where she is irritable, short tempered, appears to be responding to internal stimulation, reporting auditory hallucinations, cursing at family. Her grades have dropped from As and Bs to possibly needing Summer school. ? Followed from 03/20/2015 to 02/24/2017 when she appeared in complete remission ?Returned to [facility] OP aftercare for recurrence 05/2017 to present ?INPATIENT ?Date of Admission:  06/02/2017 ?Date of Discharge: 06/10/2017 ?Discharge Diagnoses: ?  Diagnosis Date Noted  ? Psychosis [F29] 06/03/2017  ? MDD (major depressive disorder), recurrent episode, severe ([place]) [X32   ? ?Past Medical History:  ? ?CARE EVERYWHERE REVIEW 01/28/22 ?Encounter Details ?Date Type Department Care Team Description  ?01/20/2022 Office Visit [place]   ?[place] [address]   ?[phone]   Swatzel, Hayley E, PA-C   ?[place]   ?[place], [place] [address]   ?[phone] (Work)   ?[phone] (Fax)   ?  ?[place], [place]   ?[address]   ?[address]   ?[place], [place] [address]   ?[phone] (Work)   ?[phone] (Fax)   Leukocytosis, unspecified type  ? ?Impression  ?1. Leukocytosis, unspecified type CBC And Differential  ?Comprehensive Metabolic Panel  ?BCR-ABL1 Detection for CML & ALL QNT PCR  ?Pathology Peripheral Smear Request  ?Plan  ?1. Leukocytosis: Fortunately, on review of her blood work today and on  previous blood draws, differential shows appropriate percentage of both neutrophils and lymphocytes as well as hemoglobin and platelet count which suggests to me that her CBC is a product of a healthy bone marrow and more than likely reactive cause for her leukocytosis. However, we did discuss plans to proceed with testing today of BCR-ABL as well as a peripheral smear with flow cytometry to rule out CML and CLL. We will tentatively have her return again in 4 weeks to review these results although I will reach out sooner with any concerning findings with a sooner office visit. Otherwise, all of her and her Valerie [name]'s questions were answered to their apparent satisfaction ? ?Patient is instructed to call prior to next appointment for questions, concerns or new symptoms. The patient voiced understanding of disease state and current treatment/instructions. Questions were answered appropriately. ? ?[place], PA-C ?01/20/2022 / 2:14 PM ?Final Diagnosis    ?  ?Peripheral Blood Smear Interpretation: ?  ?- Mild leukocytosis. ?- Normal flow cytometric studies. ?- Negative for increased schistocytes, significant dysplasia, or blasts.  ? ?Final Diagnosis    ?  ?Peripheral Blood, flow cytometric immunophenotyping: ?  ?- No immunophenotypic abnormalities identified. ?- See comment. ?  ?Comment: The specimen is adequate for flow cytometric studies. There is no immunophenotypic evidence of an acute leukemia, primary marrow disorder, or lymphoproliferative disorder.  Note that certain large cell lymphomas, myelodysplastic syndromes, or myeloproliferative neoplasms may not be detected by flow cytometric methods. Correlation with morphology is recommended.  ? ? ?Past Medical History:  ?Diagnosis Date  ? Anxiety   ? Depression   ? Fatigue   ? Headache   ? Psychosis ([place]) 06/03/2017  ?  ?Family Psychiatric History:  ?NOVANT ?Mental illness Maternal Uncle  ?  ? ?Family History:  ?NOVANT ?Medical History Relation Name Comments   ?Stroke Maternal Grandfather      ?Asthma Maternal Grandmother      ?Mental illness Maternal Uncle      ?Diabetes Paternal Grandmother      ?Hypertension Paternal Grandmother    ? ?Social History:  ?         ? Marital status: Single  ?    Spouse name: Not on file  ? Number of children: Not on file  ? Years of education: 20  ? Highest education level: Completing Aesthetics Course at [employer]  ?Occupational History  ? Student  ?Tobacco Use  ? Smoking status: Never  ? Smokeless tobacco: Never  ?Vaping Use  ? Vaping Use: Never used  ?Substance and Sexual Activity  ? Alcohol use: No  ? Drug use: No  ? Sexual activity: Not Currently  ?    Birth control/protection: Pill, Abstinence  ?Other Topics Concern  ? Not on file  ?Social History Narrative  ? Not on file  ?  ?Allergies:  ?Allergies  ?Allergen Reactions  ? Sulfa Antibiotics Hives and Itching  ?  Hives on face  ? ? ?Metabolic Disorder Labs: ?Lab Results  ?Component Value Date  ? HGBA1C 5.3 06/03/2017  ? MPG 105.41 06/03/2017  ? ?Lab Results  ?Component Value Date  ? PROLACTIN  79.1 (H) 06/04/2017  ? ?Lab Results  ?Component Value Date  ? CHOL 172 (H) 06/04/2017  ? TRIG 144 06/04/2017  ? HDL 69 06/04/2017  ? CHOLHDL 2.5 06/04/2017  ? VLDL 29 06/04/2017  ? [place] 74 06/04/2017  ? ?Lab Results  ?Component Value Date  ? TSH 2.863 06/03/2017  ? ? ?Therapeutic Level Labs:NA ? ?No results found for: VALPROATE ?No components found for:  CBMZ ? ?Current Medications: ?Current Outpatient Medications  ?Medication Sig Dispense Refill  ? ARIPiprazole (ABILIFY) 10 MG tablet Take 1 tablet (10 mg total) by mouth in the morning and at bedtime. 60 tablet 2  ? ascorbic acid (VITAMIN C) 1000 MG tablet Take by mouth.    ? Calcium Citrate-Vitamin D 315-250 MG-UNIT TABS Take by mouth.    ? ciprofloxacin-dexamethasone (CIPRODEX) OTIC suspension SMARTSIG:In Ear(s)    ? clomiPRAMINE (ANAFRANIL) 25 MG capsule Take 1 capsule (25 mg total) by mouth at bedtime. 90 capsule 0  ? hydrOXYzine  (VISTARIL) 50 MG capsule Take 1 capsule (50 mg total) by mouth 3 (three) times daily as needed for anxiety. 30 capsule 2  ? ketoconazole (NIZORAL) 2 % cream     ? norgestimate-ethinyl estradiol (ORTHO-CYCLEN) 0.25-

## 2022-01-29 ENCOUNTER — Telehealth (HOSPITAL_COMMUNITY): Payer: Self-pay

## 2022-01-29 NOTE — Telephone Encounter (Signed)
Writer S/W [employer] regarding Valerie [name]'s paperwork to take care of Valerie daughter, [name]. They stated that Valerie claim is Under Review and has not been closed out yet; it's just incomplete. ?They just need "3 Daily Activities that the patient needs help with" for further review. ?Please review and advise. Thank you ?

## 2022-03-04 ENCOUNTER — Telehealth (HOSPITAL_BASED_OUTPATIENT_CLINIC_OR_DEPARTMENT_OTHER): Payer: 59 | Admitting: Medical

## 2022-03-04 ENCOUNTER — Encounter (HOSPITAL_COMMUNITY): Payer: Self-pay | Admitting: Medical

## 2022-03-04 DIAGNOSIS — K7581 Nonalcoholic steatohepatitis (NASH): Secondary | ICD-10-CM | POA: Diagnosis not present

## 2022-03-04 DIAGNOSIS — F258 Other schizoaffective disorders: Secondary | ICD-10-CM

## 2022-03-04 DIAGNOSIS — E669 Obesity, unspecified: Secondary | ICD-10-CM

## 2022-03-04 DIAGNOSIS — F429 Obsessive-compulsive disorder, unspecified: Secondary | ICD-10-CM

## 2022-03-04 DIAGNOSIS — Z79899 Other long term (current) drug therapy: Secondary | ICD-10-CM

## 2022-03-04 DIAGNOSIS — R7303 Prediabetes: Secondary | ICD-10-CM

## 2022-03-04 DIAGNOSIS — Z6835 Body mass index (BMI) 35.0-35.9, adult: Secondary | ICD-10-CM

## 2022-03-04 MED ORDER — CLOMIPRAMINE HCL 25 MG PO CAPS: 25.0000 mg | ORAL_CAPSULE | Freq: Every day | ORAL | 0 refills | Status: DC

## 2022-03-04 MED ORDER — HYDROXYZINE PAMOATE 50 MG PO CAPS: 50.0000 mg | ORAL_CAPSULE | Freq: Three times a day (TID) | ORAL | 2 refills | Status: AC | PRN

## 2022-03-04 MED ORDER — ARIPIPRAZOLE 10 MG PO TABS: 10.0000 mg | ORAL_TABLET | Freq: Two times a day (BID) | ORAL | 2 refills | Status: DC

## 2022-03-04 NOTE — Progress Notes (Addendum)
[facility] MD/PA/NP OP Progress Note  03/04/2022 2:34 PM Valerie [name]  MRN:  [id number] Virtual Visit via Video Note  I connected with Valerie [name] on 03/04/22 at  2:30 PM EDT by a video enabled telemedicine application and verified that I am speaking with the correct person using two identifiers.  Location: Patient: Home Provider: [place]   I discussed the limitations of evaluation and management by telemedicine and the availability of in person appointments. The patient expressed understanding and agreed to proceed.   History of Present Illness:See EPIC note    Observations/Objective:See EPIC note   Assessment and Plan:See EPIC note   Follow Up Instructions:See EPIC note    I discussed the assessment and treatment plan with the patient. The patient was provided an opportunity to ask questions and all were answered. The patient agreed with the plan and demonstrated an understanding of the instructions.   The patient was advised to call back or seek an in-person evaluation if the symptoms worsen or if the condition fails to improve as anticipated.  I provided 20 minutes of non-face-to-face time during this encounter.   [name], PA-C   Chief Complaint:  Chief Complaint  Patient presents with   Follow-up   Altered Mental Status   HPI: Valerie [name] is seen for scheduled 1 month FU for Schizoaffective vs Schizophrenic disorder with associated OCD; having recently experienced a psychotic break due to noncompliance with medication. She hid her noncompliance from her mother and provider who was trialing a decrease of her Abilify unaware she wasnt taking it as prescribed. Today she remains in full remission on 20 mg of Abilify. She is reluctant to take injection. Her OCD continues to be managed by Clomipramine 50 mg HS. She does have rx for Vistaril PRN she uses rarely now, She has stopped taking Trazodonr for sleep as she no longer has insomnia. She is busy working around the house. She  is unclear about returning to College at present. Her parents are letting her drive again and she has gone shopping on her own. Mom has returned to work. Mom's FMLA was resolved. She did not follow thru on Counseling wanting to use har mother as before .  Visit Diagnosis:    ICD-10-CM   1. Other schizoaffective disorders ([place])  F25.8     2. Obsessive-compulsive disorder, unspecified type  F42.9     3. Medication management  Z79.899     4. Steatohepatitis  K75.81     5. Class 2 obesity without serious comorbidity with body mass index (BMI) of 35.0 to 35.9 in adult, unspecified obesity type  E66.9    Z68.35     6. Prediabetes  R73.03       Past Psychiatric History:  OUTPATIENT 03/20/2015 to Present  [place] Patient and father noted that 1st episode began in November with a few days of irritability, anxiety, depression, and anger. This resolved.  She has had two more severe episodes since then between February and now where she is irritable, short tempered, appears to be responding to internal stimulation, reporting auditory hallucinations, cursing at family. Her grades have dropped from As and Bs to possibly needing Summer school.  Followed from 03/20/2015 to 02/24/2017 when she appeared in complete remission Returned to [facility] OP aftercare for recurrence 05/2017 to present INPATIENT Date of Admission:  06/02/2017 Date of Discharge: 06/10/2017 Discharge Diagnoses:   Diagnosis Date Noted   Psychosis [F29] 06/03/2017   MDD (major depressive disorder),  recurrent episode, severe ([place]) [facility].Credit    Past Medical History:  Past Medical History:  Diagnosis Date   Anxiety    Depression    Fatigue    Headache    Psychosis ([place]) 06/03/2017   No past surgical history on file.  CARE EVERYWHERE Review:-no change 03/02/2022 3:30 PM EDT Office Visit [facility]   [address]   [place], [place] [address]    [phone]   [place], [place],  [address]   [phone] (Work)   [phone] (Fax)   Pain in both feet (Primary Dx);  Pes planus of both feetAuthored on: Mar 02, 2022 4:00 PM,Source organization: [place] / Plan:   Assessment 1. Pain in both feet (Primary) - XR Foot Bilateral Ap Lateral And Oblique; Future 2. Pes planus of both feet   Active Orders (72h ago, onward)  Ordered  XR Foot Bilateral Ap Lateral And Oblique  03/02/22 1522  Plan Discussed with patient and advised to obtain supportive sturdier shoes with supportive heel counter and supportive sole. I also advised her that she can look into over-the-counter inserts for mild arch support. I have discussed with her that she may benefit from being measured both in length and width.  Turn as needed  Family Psychiatric History:  Mental illness Maternal Uncle    Family History:  NOVANT Medical History Relation Name Comments  Stroke Maternal Grandfather      Asthma Maternal Grandmother      Mental illness Maternal Uncle      Diabetes Paternal Grandmother      Hypertension Paternal Grandmother        Social History:  Social History    Marital status: Single      Spouse name: Not on file   Number of children: Not on file   Years of education: 13   Highest education level: Completing Aesthetics Course at [employer]  Occupational History   Student  Tobacco Use   Smoking status: Never   Smokeless tobacco: Never  Vaping Use   Vaping Use: Never used  Substance and Sexual Activity   Alcohol use: No   Drug use: No   Sexual activity: Not Currently      Birth control/protection: Pill, Abstinence  Other Topics Concern   Not on file  Social History Narrative   Not on file   Social Determinants of Health   Financial Resource Strain: No  Food Insecurity: Obesity  Transportation Needs: No-has License  Physical Activity: Has  started working out  Stress: concerned about marriage and children in the future  Social Connections: Family mainly    Allergies:  Allergies  Allergen Reactions   Sulfa Antibiotics Hives and Itching    Hives on face    Marital status: Single      Spouse name: Not on file   Number of children: Not on file   Years of education: 13   Highest education level: Completing Aesthetics Course at [employer]  Occupational History   Student  Tobacco Use   Smoking status: Never   Smokeless tobacco: Never  Vaping Use   Vaping Use: Never used  Substance and Sexual Activity   Alcohol use: No   Drug use: No   Sexual activity: Not Currently      Birth control/protection: Pill, Abstinence  Other Topics Concern   Not on file  Social History Narrative   Not on file   Metabolic Disorder Labs: Lab Results  Component Value Date   HGBA1C 5.3 06/03/2017   MPG 105.41 06/03/2017   Lab Results  Component Value Date   PROLACTIN 79.1 (H) 06/04/2017   Lab Results  Component Value Date   CHOL 172 (H) 06/04/2017   TRIG 144 06/04/2017   HDL 69 06/04/2017   CHOLHDL 2.5 06/04/2017   VLDL 29 06/04/2017   LDLCALC 74 06/04/2017   Lab Results  Component Value Date   TSH 2.863 06/03/2017    Therapeutic Level Labs: No results found for: LITHIUM No results found for: VALPROATE No components found for:  CBMZ  Current Medications: Current Outpatient Medications  Medication Sig Dispense Refill   ARIPiprazole (ABILIFY) 10 MG tablet Take 1 tablet (10 mg total) by mouth in the morning and at bedtime. 60 tablet 2   ascorbic acid (VITAMIN C) 1000 MG tablet Take by mouth.     Calcium Citrate-Vitamin D 315-250 MG-UNIT TABS Take by mouth.     ciprofloxacin-dexamethasone (CIPRODEX) OTIC suspension SMARTSIG:In Ear(s)     clomiPRAMINE (ANAFRANIL) 25 MG capsule Take 1 capsule (25 mg total) by mouth at bedtime. 90 capsule 0   hydrOXYzine (VISTARIL) 50 MG capsule Take 1 capsule (50 mg total) by mouth  3 (three) times daily as needed for anxiety. 30 capsule 2   ketoconazole (NIZORAL) 2 % cream      norgestimate-ethinyl estradiol (ORTHO-CYCLEN) 0.25-35 MG-MCG tablet Take 1 tablet by mouth daily.     Omega-3 Fatty Acids (FISH OIL) 1000 MG CAPS Take by mouth.     omeprazole (PRILOSEC) 20 MG capsule Take by mouth.     simethicone (GAS-X) 80 MG chewable tablet Chew 1 tablet (80 mg total) by mouth every 6 (six) hours as needed for flatulence. 30 tablet 0   tiZANidine (ZANAFLEX) 2 MG tablet Take 1-2 tablets at night as needed by mouth before bed for back pain or muscle spasms     traZODone (DESYREL) 50 MG tablet Take 1 tablet (50 mg total) by mouth at bedtime and may repeat dose one time if needed. 60 tablet 1   No current facility-administered medications for this visit.    Musculoskeletal: Strength & Muscle Tone: Telepsych visit-Grossly normal Musculoskeletal and cranial nerve inspections Gait & Station: NA Patient leans: N/A   Psychiatric Specialty Exam: Review of Systems  Constitutional:  Positive for activity change (returned to normal activities and ADLs). Negative for appetite change, chills, diaphoresis, fatigue, fever and unexpected weight change.  Musculoskeletal:  Negative for arthralgias, back pain, gait problem (see [place]), joint swelling, myalgias, neck pain and neck stiffness.  Neurological:  Negative for dizziness, tremors, seizures, syncope, facial asymmetry, speech difficulty, weakness, light-headedness, numbness and headaches.  Psychiatric/Behavioral:  Negative for agitation, behavioral problems, confusion, decreased concentration, dysphoric mood, hallucinations, self-injury, sleep disturbance and suicidal ideas. The patient is not nervous/anxious and is not hyperactive.    There were no vitals taken for this visit.There is no height or weight on file to calculate BMI.MY CHART Visit  General Appearance: Well Groomed  Eye Contact:  Good  Speech:  Clear and  Coherent and Normal Rate  Volume:  Normal  Mood:  Euthymic  Affect:   Smiling  Thought Process:  Coherent, Goal Directed, and Descriptions of Associations: Intact  Orientation:  Full (Time, Place, and Person)  Thought Content: WDL and Logical   Suicidal Thoughts:  No  Homicidal Thoughts:  No  Memory:  Negative  Judgement:  Other:  Improved  Insight:  Fair  Psychomotor Activity:  Negative  Concentration:  Concentration: Good and Attention Span: Good  Recall:  Negative  Fund of Knowledge:  WDL  Language:  Bilingual  Akathisia:  Negative  Handed:  Right  AIMS (if indicated): NA  Assets:  Desire for Improvement Financial Resources/Insurance Housing Resilience Social Support Talents/Skills Transportation Vocational/Educational  ADL's:  Intact  Cognition: WNL  Sleep:  Negative   Screenings: [place] Office Visit from 09/06/2019 in [facility] Office Visit from 07/13/2018 in [place] Office Visit from 01/19/2018 in [facility] Office Visit from 10/27/2017 in [facility] Admission (Discharged) from 06/02/2017 in [place] CH[facility]  AIMS Total Score 0 0 0 0 0      AUDIT    Flowsheet Row Admission (Discharged) from 06/02/2017 in [facility]  Alcohol Use Disorder Identification Test Final Score (AUDIT) 0      Flowsheet Row ED from 12/03/2021 in [facility] No Risk        Assessment : In full remission      and Plan: Continue current medications fOLLOWUP VISIT 3 months-sooner if neded    [name], PA-C 03/04/2022, 2:34 PM

## 2022-06-03 ENCOUNTER — Telehealth (HOSPITAL_BASED_OUTPATIENT_CLINIC_OR_DEPARTMENT_OTHER): Payer: 59 | Admitting: Medical

## 2022-06-03 ENCOUNTER — Encounter (HOSPITAL_COMMUNITY): Payer: Self-pay | Admitting: Medical

## 2022-06-03 DIAGNOSIS — K7581 Nonalcoholic steatohepatitis (NASH): Secondary | ICD-10-CM | POA: Diagnosis not present

## 2022-06-03 DIAGNOSIS — Z79899 Other long term (current) drug therapy: Secondary | ICD-10-CM

## 2022-06-03 DIAGNOSIS — F258 Other schizoaffective disorders: Secondary | ICD-10-CM

## 2022-06-03 DIAGNOSIS — F429 Obsessive-compulsive disorder, unspecified: Secondary | ICD-10-CM | POA: Diagnosis not present

## 2022-06-03 DIAGNOSIS — E669 Obesity, unspecified: Secondary | ICD-10-CM

## 2022-06-03 DIAGNOSIS — Z6835 Body mass index (BMI) 35.0-35.9, adult: Secondary | ICD-10-CM

## 2022-06-03 DIAGNOSIS — R7303 Prediabetes: Secondary | ICD-10-CM

## 2022-06-03 MED ORDER — ARIPIPRAZOLE 10 MG PO TABS: 10.0000 mg | ORAL_TABLET | Freq: Every day | ORAL | 0 refills | Status: DC

## 2022-06-03 MED ORDER — CLOMIPRAMINE HCL 25 MG PO CAPS: 25.0000 mg | ORAL_CAPSULE | Freq: Every day | ORAL | 0 refills | Status: DC

## 2022-06-03 NOTE — Progress Notes (Addendum)
Virtual Visit via Video Note  I connected with Valerie [name] on 06/03/22 at  2:30 PM EDT by a video enabled telemedicine application and verified that I am speaking with the correct person using two identifiers.  Location: Patient: Home  Provider: [place]   I discussed the limitations of evaluation and management by telemedicine and the availability of in person appointments. The patient expressed understanding and agreed to proceed.   History of Present Illness:See EPIC note    Observations/Objective:See EPIC note   Assessment and Plan:See EPIC note   Follow Up Instructions:See EPIC note    I discussed the assessment and treatment plan with the patient. The patient was provided an opportunity to ask questions and all were answered. The patient agreed with the plan and demonstrated an understanding of the instructions.   The patient was advised to call back or seek an in-person evaluation if the symptoms worsen or if the condition fails to improve as anticipated.  I provided 20  minutes of non-face-to-face time during this encounter.   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 She hid her noncompliance from her mother and provider who was trialing a decrease of her Abilify unaware she wasnt taking it as prescribed.  By March she was in complete remission on 10 mg of Abilify with aded OCD medication Anafranil and Trazodone for sleep/PRN Vistaril for anxiety.SWhe refused injectable Abilify treatment.She stopped her Trazodone in March and continues to have no difficulty sleeping. She denies any symptoms of  anxiety and/or depression. She is back to driving. 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  Visit Diagnosis:    ICD-10-CM   1. Other schizoaffective disorders ([place])  F25.8     2. Obsessive-compulsive disorder, unspecified type  F42.9     3. Medication management  Z79.899     4. Steatohepatitis  K75.81     5. Class 2 obesity without serious comorbidity with body mass index (BMI) of 35.0 to 35.9 in adult, unspecified obesity type  E66.9    Z68.35     6. Prediabetes  R73.03       Past Psychiatric History:  OUTPATIENT 03/20/2015 to Present  [name] and father noted that 1st episode began in November with a few days of irritability, anxiety, depression, and anger. This resolved.  She has had two more severe episodes since then between February and now where she is irritable, short tempered, appears to be responding to internal stimulation, reporting auditory hallucinations, cursing at family. Her grades have dropped from As and Bs to possibly needing Summer school.  Followed from 03/20/2015 to 02/24/2017 when she appeared in complete remission Returned to [facility] OP aftercare for recurrence 05/2017 to present INPATIENT Date of Admission:  06/02/2017 Date of Discharge: 06/10/2017 Discharge Diagnoses:   Diagnosis Date Noted   Psychosis [F29] 06/03/2017   MDD (major depressive disorder), recurrent episode, severe ([name]) Valerie [name]    Past Medical History: CARE EVERYWHERE Review  Encounter Details  Encounter Details Date Type Department Care Team Description  03/02/2022 3:30 PM EDT  Office Visit [facility]  [address]  [place], [place] [address]  [phone]  Belva Chimes, [place], [place] [address]  [phone] (Work)  [phone] (Fax)  Pain in both feet (Primary Dx); Pes planus of both feet    Encounter Details Date Type Department Care Team Description  03/24/2022 3:00 PM EDT Office Visit [facility]  [address]  [place], [place] [address]  [phone]  [name]  [address]  [place], [place] [address]  [phone] (Work)  [phone] (Fax)  Class 1 obesity due to excess calories without serious comorbidity with body mass index (BMI) of 34.0 to 34.9 in adult (Primary Dx); Obsessive-compulsive disorder, unspecified type; Schizoaffective disorder, unspecified type (*); Gastroesophageal reflux disease, unspecified whether esophagitis present; Acute cough; Sore throat  Visit Diagnoses - documented in this encounter Visit Diagnoses Diagnosis  Class 1 obesity due to excess calories without serious comorbidity with body mass index (BMI) of 34.0 to 34.9 in adult - Primary   Obsessive-compulsive disorder, unspecified type   Schizoaffective disorder, unspecified type (*)   Gastroesophageal reflux disease, unspecified whether esophagitis present   Acute cough   Sore throat  Acute pharyngitis    CARE EVERYWHERE REVIEW 01/28/22  Encounter Details Date Type Department Care Team Description  01/20/2022 Office Visit [facility]   [address]   [place]   [place], [place] [address]   [phone]   [name]   [address]   [place], [place] [address]   [phone] (Work)   [phone] ([address]     [place], [place], [place]   [place], [place] [address]   [phone] (Work)   [phone] (Fax)   Leukocytosis, unspecified type    Impression  1. Leukocytosis, unspecified type CBC And Differential  Comprehensive Metabolic Panel  BCR-ABL1 Detection for CML & ALL QNT PCR  Pathology Peripheral Smear Request  Plan  1. Leukocytosis: Fortunately, on review of her blood work today and on previous blood draws, differential shows  appropriate percentage of both neutrophils and lymphocytes as well as hemoglobin and platelet count which suggests to me that her CBC is a product of a healthy bone marrow and more than likely reactive cause for her leukocytosis. However, we did discuss plans to proceed with testing today of BCR-ABL as well as a peripheral smear with flow cytometry to rule out CML and CLL. We will tentatively have her return again in 4 weeks to review these results although I will reach out sooner with any concerning findings with a sooner office visit. Otherwise, all of her and her mother's questions were answered to their apparent satisfaction  Patient is instructed to call prior to next appointment for questions, concerns or new symptoms. The patient voiced understanding of disease state and current treatment/instructions. Questions were answered appropriately.  [place], PA-C 01/20/2022 / 2:14 PM Final Diagnosis       Peripheral Blood Smear Interpretation:   - Mild leukocytosis. - Normal flow cytometric studies. - Negative for increased schistocytes, significant dysplasia, or blasts.    Final Diagnosis       Peripheral Blood, flow cytometric immunophenotyping:   - No immunophenotypic abnormalities identified. - See comment.   Comment: The specimen is adequate for flow cytometric studies. There is no immunophenotypic evidence of an acute leukemia, primary marrow disorder,  or lymphoproliferative disorder.  Note that certain large cell lymphomas, myelodysplastic syndromes, or myeloproliferative neoplasms may not be detected by flow cytometric methods. Correlation with morphology is recommended.    Past Medical History:  Diagnosis Date   Anxiety    Depression    Fatigue    Headache    Psychosis ([place]) 06/03/2017   No past surgical history on file.  Family Psychiatric History:  Mental illness Maternal Uncle   Family History:  NOVANT Medical History Relation Name Comments  Stroke Maternal  Grandfather      Asthma Maternal Grandmother      Mental illness Maternal Uncle      Diabetes Paternal Grandmother      Hypertension Paternal Grandmother     Social History:   Marital status: Single      Spouse name: Not on file   Number of children: Not on file   Years of education: 13   Highest education level: Started Aesthetics Course at [employer]  Occupational History   Student/Homemaker  Tobacco Use   Smoking status: Never   Smokeless tobacco: Never  Vaping Use   Vaping Use: Never used  Substance and Sexual Activity   Alcohol use: No   Drug use: No   Sexual activity: Not Currently      Birth control/protection: Pill, Abstinence  Other Topics Concern   Not on file  Social History Narrative   Not on file     Allergies:  Allergies  Allergen Reactions   Sulfa Antibiotics Hives and Itching    Hives on face    Metabolic Disorder Labs: Lab Results  Component Value Date   HGBA1C 5.3 06/03/2017   MPG 105.41 06/03/2017   Lab Results  Component Value Date   PROLACTIN 79.1 (H) 06/04/2017   Lab Results  Component Value Date   CHOL 172 (H) 06/04/2017   TRIG 144 06/04/2017   HDL 69 06/04/2017   CHOLHDL 2.5 06/04/2017   VLDL 29 06/04/2017   LDLCALC 74 06/04/2017   Lab Results  Component Value Date   TSH 2.863 06/03/2017    Therapeutic Level Labs:NA  Current Medications: Current Outpatient Medications  Medication Sig Dispense Refill   ARIPiprazole (ABILIFY) 10 MG tablet Take 1 tablet (10 mg total) by mouth daily for 90 doses. 90 tablet 0   ascorbic acid (VITAMIN C) 1000 MG tablet Take by mouth.     Calcium Citrate-Vitamin D 315-250 MG-UNIT TABS Take by mouth.     ciprofloxacin-dexamethasone (CIPRODEX) OTIC suspension SMARTSIG:In Ear(s)     clomiPRAMINE (ANAFRANIL) 25 MG capsule Take 1 capsule (25 mg total) by mouth at bedtime. 90 capsule 0   ketoconazole (NIZORAL) 2 % cream      metroNIDAZOLE (METROGEL) 0.75 % vaginal gel Insert 1 applicator  vaginally every night x 5 nights     norgestimate-ethinyl estradiol (ORTHO-CYCLEN) 0.25-35 MG-MCG tablet Take 1 tablet by mouth daily.     Omega-3 Fatty Acids (FISH OIL) 1000 MG CAPS Take by mouth.     omeprazole (PRILOSEC) 20 MG capsule Take by mouth.     simethicone (GAS-X) 80 MG chewable tablet Chew 1 tablet (80 mg total) by mouth every 6 (six) hours as needed for flatulence. 30 tablet 0   tiZANidine (ZANAFLEX) 2 MG tablet Take 1-2 tablets at night as needed by mouth before bed for back pain or muscle spasms     No current facility-administered medications for this visit.    Musculoskeletal: Strength & Muscle Tone: Telepsych visit-Grossly normal Musculoskeletal  and cranial nerve inspections Gait & Station: NA Patient leans: N/A  Psychiatric Specialty Exam: Review of Systems  Constitutional:  Negative for activity change, appetite change, chills, diaphoresis, fatigue, fever and unexpected weight change.  HENT: Negative.    Eyes: Negative.   Respiratory: Negative.    Cardiovascular: Negative.   Gastrointestinal:  Positive for abdominal pain. Negative for abdominal distention, anal bleeding, blood in stool, constipation, diarrhea, nausea, rectal pain and vomiting.       Hx of GERD  Endocrine: Negative for cold intolerance, heat intolerance, polydipsia, polyphagia and polyuria.       Obesity Prediabetes  Genitourinary:  Negative for decreased urine volume, difficulty urinating, dyspareunia, dysuria, enuresis, flank pain, frequency, genital sores, hematuria, menstrual problem, pelvic pain, urgency, vaginal bleeding, vaginal discharge and vaginal pain.  Musculoskeletal: Negative.  Negative for arthralgias, back pain, gait problem, joint swelling, myalgias, neck pain and neck stiffness.  Allergic/Immunologic: Negative.   Neurological:  Negative for dizziness, tremors, seizures, syncope, facial asymmetry, speech difficulty, weakness, light-headedness, numbness and headaches.  Hematological:  Negative.   Psychiatric/Behavioral:  Positive for sleep disturbance. Negative for agitation, behavioral problems, confusion, decreased concentration, dysphoric mood, hallucinations, self-injury and suicidal ideas. The patient is not nervous/anxious and is not hyperactive.     There were no vitals taken for this visit.There is no height or weight on file to calculate BMI.MY CHART VIDEO visit  General Appearance: Casual Well groomed Smiling  Eye Contact:  Good  Speech:  Hispanic accent /WNL  Volume:  WNL  Mood: Euthymic  Affect:  Congruent  Thought Process: Logical WDL Associations intact   Orientation:  Full  Thought Content:  WDL   Suicidal Thoughts:  No  Homicidal Thoughts:  No  Memory:  Negative  Judgement: Intact   Insight:  Seems good  Psychomotor Activity:  WNL  Concentration:  Good/WNL  Recall:  WNL  Fund of Knowledge: WDL  Language: Bilingual  Akathisia:  NA  Handed:  Right  AIMS (if indicated): NA  Assets:  Desire for Improvement Financial Resources/Insurance Housing Leisure Time Resilience Social Support Transportation Vocational/Educational  ADL's:  Intact  Cognition:WDL  Sleep:  No problems   Screenings: [place] Office Visit from 09/06/2019 in [place] Office Visit from 07/13/2018 in [place] Office Visit from 01/19/2018 in [place] Office Visit from 10/27/2017 in [place] Admission (Discharged) from 06/02/2017 in [place] CHILD/[facility]  AIMS Total Score 0 0 0 0 0      AUDIT    Flowsheet Row Admission (Discharged) from 06/02/2017 in [place] CHILD/[facility]  Alcohol Use Disorder Identification Test Final Score (AUDIT) 0      Flowsheet Row ED from 12/03/2021 in [place] No Risk         Assessment : Continues in stable remission     and Plan: Continue current medications FU 3 months/sooner if needed    Valerie Russian, PA-C 06/03/2022, 2:30 PM

## 2022-09-02 ENCOUNTER — Telehealth (HOSPITAL_BASED_OUTPATIENT_CLINIC_OR_DEPARTMENT_OTHER): Payer: 59 | Admitting: Medical

## 2022-09-02 ENCOUNTER — Encounter (HOSPITAL_COMMUNITY): Payer: Self-pay | Admitting: Medical

## 2022-09-02 DIAGNOSIS — Z79899 Other long term (current) drug therapy: Secondary | ICD-10-CM | POA: Diagnosis not present

## 2022-09-02 DIAGNOSIS — Z6835 Body mass index (BMI) 35.0-35.9, adult: Secondary | ICD-10-CM

## 2022-09-02 DIAGNOSIS — F258 Other schizoaffective disorders: Secondary | ICD-10-CM

## 2022-09-02 DIAGNOSIS — F429 Obsessive-compulsive disorder, unspecified: Secondary | ICD-10-CM | POA: Diagnosis not present

## 2022-09-02 DIAGNOSIS — K7581 Nonalcoholic steatohepatitis (NASH): Secondary | ICD-10-CM

## 2022-09-02 DIAGNOSIS — R7303 Prediabetes: Secondary | ICD-10-CM

## 2022-09-02 DIAGNOSIS — E669 Obesity, unspecified: Secondary | ICD-10-CM

## 2022-09-02 MED ORDER — ARIPIPRAZOLE 10 MG PO TABS: 10.0000 mg | ORAL_TABLET | Freq: Every day | ORAL | 0 refills | Status: DC

## 2022-09-02 MED ORDER — CLOMIPRAMINE HCL 25 MG PO CAPS: 25.0000 mg | ORAL_CAPSULE | Freq: Every day | ORAL | 0 refills | Status: DC

## 2022-09-02 NOTE — Progress Notes (Signed)
[facility] MD/PA/NP OP Progress Note  09/02/2022 3:13 PM Valerie [name]  MRN:  [id number] Virtual Visit via Video Note  I connected with Valerie [name] on 09/02/22 at  2:00 PM EST by a video enabled telemedicine application and verified that I am speaking with the correct person using two identifiers.  Location: Patient: At home Provider: [place]   I discussed the limitations of evaluation and management by telemedicine and the availability of in person appointments. The patient expressed understanding and agreed to proceed.   History of Present Illness:See EPIC note    Observations/Objective:See EPIC note   Assessment and Plan:See EPIC note   Follow Up Instructions:See EPIC note   I discussed the assessment and treatment plan with the patient. The patient was provided an opportunity to ask questions and all were answered. The patient agreed with the plan and demonstrated an understanding of the instructions.   The patient was advised to call back or seek an in-person evaluation if the symptoms worsen or if the condition fails to improve as anticipated.  I provided 20 minutes of non-face-to-face time during this encounter.   Valerie Russian, PA-C   Chief Complaint:  Chief Complaint  Patient presents with   Follow-up   Schizophrenia   OCD   Medication Refill   HPI: Valerie [name] returns for scheduled 3 month FU now 5 months into full remission of her Schizoaffective/Schizophrenic  disorder with associated OCD. She continue to be hallucination free without any anxiety or mood disorder complaints. She continues to sleep well .Valerie [name] is staying at home taking v care of house while parents work.She is seen alone today..Care everywhere is reviewed as well Visit Diagnosis:    ICD-10-CM   1. Other schizoaffective disorders ([place])  F25.8     2. Obsessive-compulsive disorder, unspecified type  F42.9     3. Medication management  Z79.899     4. Steatohepatitis  K75.81     5. Class 2  obesity without serious comorbidity with body mass index (BMI) of 35.0 to 35.9 in adult, unspecified obesity type  E66.9    Z68.35     6. Prediabetes  R73.03      Past Psychiatric History:  OUTPATIENT 03/20/2015 to Present  [place] Patient and father noted that 1st episode began in November with a few days of irritability, anxiety, depression, and anger. This resolved.  She has had two more severe episodes since then between February and now where she is irritable, short tempered, appears to be responding to internal stimulation, reporting auditory hallucinations, cursing at family. Her grades have dropped from As and Bs to possibly needing Summer school.  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   [place], [place] [address]   [phone]   Belva Chimes, [place], [place] [address]   [phone] (Work)   [phone] (Fax)   Pain in both feet (Primary Dx);  Pes planus of both feetAuthored on: Mar 02, 2022 4:00 PM,Source organization:  [facility]     Assessment / Plan:   Assessment 1. Pain in both feet (Primary) - XR Foot Bilateral Ap Lateral And Oblique; Future 2. Pes planus of both feet   Active Orders (72h ago, onward)  Ordered  XR Foot Bilateral Ap Lateral And Oblique  03/02/22 1522  Plan Discussed with patient and advised to obtain supportive sturdier shoes with supportive heel counter and  supportive sole. I also advised her that she can look into over-the-counter inserts for mild arch support. I have discussed with her that she may benefit from being measured both in length and width.  Turn as needed  Past Medical History:  Diagnosis Date   Anxiety    Depression    Fatigue    Headache    Psychosis ([place]) 06/03/2017   History reviewed. No pertinent surgical history.  Family Psychiatric History:  Mental illness Maternal Uncle    Family History:  NOVANT Medical History Relation Name Comments  Stroke Maternal Grandfather      Asthma Maternal Grandmother      Mental illness Maternal Uncle      Diabetes Paternal Grandmother      Hypertension Paternal Grandmother        Social History:  Social History    Marital status: Single       Spouse name: Not on file   Number of children: Not on file   Years of education: 13   Highest education level: Completing Aesthetics Course at [employer]  Occupational History   Student  Tobacco Use   Smoking status: Never   Smokeless tobacco: Never  Vaping Use   Vaping Use: Never used  Substance and Sexual Activity   Alcohol use: No   Drug use: No   Sexual activity: Not Currently      Birth control/protection: Pill, Abstinence  Other Topics Concern   Not on file  Social History Narrative   Not on file    Social Determinants of Health   Financial Resource Strain: No  Food Insecurity: No  Transportation Needs: No  Physical Activity: No  Stress: Denies  Social Connections: Lives at home Has DL    Allergies:  Allergies  Allergen Reactions   Sulfa Antibiotics Hives and Itching    Hives on face    Metabolic Disorder Labs: Lab Results  Component Value Date   HGBA1C 5.3 06/03/2017   MPG 105.41 06/03/2017   Lab Results  Component Value Date   PROLACTIN 79.1 (H) 06/04/2017   Lab Results  Component Value Date   CHOL 172 (H) 06/04/2017   TRIG 144 06/04/2017   HDL 69 06/04/2017   CHOLHDL 2.5 06/04/2017    VLDL 29 06/04/2017   LDLCALC 74 06/04/2017   Lab Results  Component Value Date   TSH 2.863 06/03/2017    Therapeutic Level Labs:NA  Current Medications: Current Outpatient Medications  Medication Sig Dispense Refill   ARIPiprazole (ABILIFY) 10 MG tablet Take 1 tablet (10 mg total) by mouth daily for 90 doses. 90 tablet 0   ascorbic acid (VITAMIN C) 1000 MG tablet Take by mouth.     Calcium Citrate-Vitamin D 315-250 MG-UNIT TABS Take by mouth.     ciprofloxacin-dexamethasone (CIPRODEX) OTIC suspension SMARTSIG:In Ear(s)     clomiPRAMINE (ANAFRANIL) 25 MG capsule Take 1 capsule (25 mg total) by mouth at bedtime. 90 capsule 0   ketoconazole (NIZORAL) 2 % cream      metroNIDAZOLE (METROGEL) 0.75 % vaginal gel Insert 1 applicator vaginally every night  x 5 nights     norgestimate-ethinyl estradiol (ORTHO-CYCLEN) 0.25-35 MG-MCG tablet Take 1 tablet by mouth daily.     Omega-3 Fatty Acids (FISH OIL) 1000 MG CAPS Take by mouth.     omeprazole (PRILOSEC) 20 MG capsule Take by mouth.     simethicone (GAS-X) 80 MG chewable tablet Chew 1 tablet (80 mg total) by mouth every 6 (six) hours as needed for flatulence. 30 tablet 0   tiZANidine (ZANAFLEX) 2 MG tablet Take 1-2 tablets at night as needed by mouth before bed for back pain or muscle spasms     No current facility-administered medications for this visit.     Musculoskeletal: Strength & Muscle Tone: Telepsych visit-Grossly normal Musculoskeletal and cranial nerve inspections Gait & Station: NA Patient leans: N/A Psychiatric Specialty Exam: Review of Systems  Constitutional:  Negative for activity change, appetite change, chills, diaphoresis, fatigue, fever and unexpected weight change.  Respiratory: Negative.    Cardiovascular: Negative.   Gastrointestinal: Negative.   Endocrine: Negative for cold intolerance, heat intolerance, polydipsia, polyphagia and polyuria.  Genitourinary: Negative.   Neurological:  Negative for dizziness,  tremors, seizures, syncope, facial asymmetry, speech difficulty, weakness, light-headedness, numbness and headaches.  Psychiatric/Behavioral:  Negative for agitation, behavioral problems, confusion, decreased concentration, dysphoric mood, hallucinations, self-injury, sleep disturbance and suicidal ideas. The patient is not nervous/anxious and is not hyperactive.     There were no vitals taken for this visit.There is no height or weight on file to calculate BMI.MY CHART VIDEO visit  General Appearance: Casual and Well Groomed  Eye Contact:  Good  Speech:  Clear and Coherent and Normal Rate  Volume:  Normal  Mood:  Euthymic  Affect:  Appropriate and Congruent  Thought Process:  Coherent, Goal Directed, and Descriptions of Associations: Intact  Orientation:  Full (Time, Place, and Person)  Thought Content: WDL and Logical   Suicidal Thoughts:  No  Homicidal Thoughts:  No  Memory:  Negative  Judgement:  Intact  Insight:  Present  Psychomotor Activity:  Normal for video  Concentration:  Concentration: Good and Attention Span: Good  Recall:   Intact except for Psychotic episodes  Fund of Knowledge:  WDL  Language: Good  Akathisia:  NA  Handed:  Right  AIMS (if indicated): NA  Assets:  Desire for Improvement Financial Resources/Insurance Housing Resilience Social Support Transportation Vocational/Educational  ADL's:  Intact  Cognition: WNL  Sleep:  Good   Screenings: [place] Office Visit from 09/06/2019 in [place] Office Visit from 07/13/2018 in [place] Office Visit from 01/19/2018 in [place] Office Visit from 10/27/2017 in [place] Admission (Discharged) from 06/02/2017 in [place] Total Score 0 0 0 0 0      AUDIT    Flowsheet Row Admission  (Discharged) from 06/02/2017 in [place] CHILD/[facility] 100B  Alcohol Use Disorder Identification Test Final Score (AUDIT) 0      Flowsheet Row ED from 12/03/2021 in [place] No Risk        Assessment : Continued full     and Plan: Continue current medications FU 4 mos sooner if needed   Valerie Russian, PA-C 09/02/2022, 3:13 PM

## 2022-12-29 ENCOUNTER — Other Ambulatory Visit (HOSPITAL_COMMUNITY): Payer: Self-pay | Admitting: Medical

## 2023-01-05 ENCOUNTER — Telehealth (HOSPITAL_COMMUNITY): Payer: Self-pay | Admitting: *Deleted

## 2023-01-05 NOTE — Telephone Encounter (Signed)
Rx REFILL REQUEST::  clomiPRAMINE (ANAFRANIL) 25 MG capsule    [place], [place]:: 11/28/22   NEXT VISIT::01/05/22 LAST VISIT::09/12/22

## 2023-01-05 NOTE — Telephone Encounter (Signed)
RX REILL REQUEST:: ARIPiprazole (ABILIFY) 10 MG tablet   [place], [place]:: 11/28/22  NEXT VISIT::01/05/22 LAST VISIT::09/12/22

## 2023-01-06 ENCOUNTER — Telehealth (HOSPITAL_BASED_OUTPATIENT_CLINIC_OR_DEPARTMENT_OTHER): Payer: 59 | Admitting: Medical

## 2023-01-06 DIAGNOSIS — K7581 Nonalcoholic steatohepatitis (NASH): Secondary | ICD-10-CM | POA: Diagnosis not present

## 2023-01-06 DIAGNOSIS — E669 Obesity, unspecified: Secondary | ICD-10-CM

## 2023-01-06 DIAGNOSIS — R7303 Prediabetes: Secondary | ICD-10-CM | POA: Diagnosis not present

## 2023-01-06 DIAGNOSIS — F429 Obsessive-compulsive disorder, unspecified: Secondary | ICD-10-CM | POA: Diagnosis not present

## 2023-01-06 DIAGNOSIS — Z79899 Other long term (current) drug therapy: Secondary | ICD-10-CM

## 2023-01-06 DIAGNOSIS — F258 Other schizoaffective disorders: Secondary | ICD-10-CM | POA: Diagnosis not present

## 2023-01-06 DIAGNOSIS — Z6835 Body mass index (BMI) 35.0-35.9, adult: Secondary | ICD-10-CM

## 2023-01-06 MED ORDER — CLOMIPRAMINE HCL 25 MG PO CAPS: 25.0000 mg | ORAL_CAPSULE | Freq: Every day | ORAL | 0 refills | Status: DC

## 2023-01-06 MED ORDER — ARIPIPRAZOLE 10 MG PO TABS: 10.0000 mg | ORAL_TABLET | Freq: Every day | ORAL | 0 refills | Status: DC

## 2023-01-06 NOTE — Progress Notes (Signed)
[facility] MD/PA/NP OP Progress Note  01/06/2023 5:02 PM Valerie [name]  MRN:  [id number] Virtual Visit via Video Note  I connected with [name] on 01/06/23 at  2:00 PM EDT by a video enabled telemedicine application and verified that I am speaking with the correct person using two identifiers.  Location: Patient: Pt at home Provider: [facility] [place]   I discussed the limitations of evaluation and management by telemedicine and the availability of in person appointments. The patient expressed understanding and agreed to proceed.   History of Present Illness:See EPIC note    Observations/Objective:See EPIC note   Assessment and Plan:See EPIC note   Follow Up Instructions:See EPIC note   I discussed the assessment and treatment plan with the patient. The patient was provided an opportunity to ask questions and all were answered. The patient agreed with the plan and demonstrated an understanding of the instructions.   The patient was advised to call back or seek an in-person evaluation if the symptoms worsen or if the condition fails to improve as anticipated.  I provided 20 minutes of non-face-to-face time during this encounter.   [name], PA-C   Chief Complaint:  Chief Complaint  Patient presents with   Follow-up   Hallucinations   OCD   Medication Refill   HPI: Valerie [name] returns today for scheduled FU for Schizoaffective disorder with associated OCD traits. She was attempted to stop antipsychotic medication by taper but unbeknown to family and provider she had stopped taking her meds on her own.As a result, she relapsed into psychosis.She refused inpatient observation but did begin to respond to reinstitution of her meds and recovered within 71/2 weeks. She refused injectable Abilify. 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 She does not have any plans to return to school. She  is doing some dating.  Visit Diagnosis:    ICD-10-CM   1. Other schizoaffective disorders ([employer])  F25.8     2. Obsessive-compulsive disorder, unspecified type  F42.9     3. Medication management  Z79.899     4. Steatohepatitis  K75.81     5. Class 2 obesity without serious comorbidity with body mass index (BMI) of 35.0 to 35.9 in adult, unspecified obesity type  E66.9    Z68.35     6. Prediabetes  R73.03       Past Psychiatric History:  OUTPATIENT 03/20/2015 to Present  [place] Patient and father noted that 1st episode began in November with a few days of irritability, anxiety, depression, and anger. This resolved.  She has had two more severe episodes since then between February and now where she is irritable, short tempered, appears to be responding to internal stimulation, reporting auditory hallucinations, cursing at family. Her grades have dropped from As and Bs to possibly needing Summer school.  Followed from 03/20/2015 to 02/24/2017 when she appeared in complete remission Returned to [facility] OP aftercare for recurrence 05/2017 to present INPATIENT Date of Admission:  06/02/2017 Date of Discharge: 06/10/2017 Discharge Diagnoses:   Diagnosis Date Noted   Psychosis [F29] 06/03/2017   MDD (major depressive disorder), recurrent episode, severe ([place]) Leonardo.Credit      Past Medical History:  Care Everywhere Encounter Details Date Type Department Care Team Description  12/20/2022 10:30 AM EST Office Visit [facility]  [address]  [place]lis Porta  [place], [place] [address]  [phone]  Wil[place], [place]  [place]  [place], [place] [address]  [phone] (Work)  [phone] (Fax)  Prediabetes (Primary Dx); Class 2 obesity due to excess calories without serious comorbidity with body mass index (BMI) of 36.0 to 36.9 in adult; Dietary counseling; Exercise counseling  Encounter Details Date Type  Department Care Team Description  12/01/2022 10:30 AM EST [facility]  [address]  [place], [place] [address]  [phone]  Lenon Ahmadi, RN  Trichomonas vaginitis (Primary Dx)   Anatomical Region Laterality Modality  Pelvis -- Ultrasound  Impression GYN U/S:  Uterus anteverted - small 12m fibroid noted, endometrium 6.6 mm - no masses seen, right ovary normal - no masses seen +perfusion, left ovary normal - no masses seen + perfusion, no free fluid. 03/02/2022 3:30 PM EDT Assessment 1. Pain in both feet (Primary) - XR Foot Bilateral Ap Lateral And Oblique; Future 2. Pes planus of both feet  [facility]   Past Medical History:  Diagnosis Date   Anxiety    Depression    Fatigue    Headache    Psychosis ([place] 06/03/2017    No pertinent surgical history.   Family Psychiatric History:  Mental illness Maternal Uncle     Family History:  NOVANT Medical History Relation Name Comments  Stroke Maternal Grandfather      Asthma Maternal Grandmother      Mental illness Maternal Uncle      Diabetes Paternal Grandmother      Hypertension Paternal Grandmother      Social History:  Social History   Socioeconomic History   arital status: Single        Spouse name: NA   Number of children: NA   Years of education: 12   Highest education level: Unable to complete Aesthetics Course at [employer] Occupational History   Homemaker  Tobacco Use   Smoking status: Never   Smokeless tobacco: Never  Vaping Use   Vaping Use: Never used  Substance and Sexual Activity   Alcohol use: No   Drug use: No   Sexual activity: Not Currently      Birth control/protection: Pill,   Other Topics Concern   Not on file  Social History Narrative   Not on file      Social Determinants of Health    Financial Resource Strain: No  Food Insecurity: No  Transportation Needs: No  Physical Activity: No   Stress: Denies  Social Connections: Lives at home Has DL    Social Connections: Not on file    Allergies:  Allergies  Allergen Reactions   Sulfa Antibiotics Hives and Itching    Hives on face    Metabolic Disorder Labs: Lab Results  Component Value Date   HGBA1C 5.3 06/03/2017   MPG 105.41 06/03/2017   Lab Results  Component Value Date   PROLACTIN 79.1 (H) 06/04/2017   Lab Results  Component Value Date   CHOL 172 (H) 06/04/2017   TRIG 144 06/04/2017   HDL 69 06/04/2017   CHOLHDL 2.5 06/04/2017   VLDL 29 06/04/2017   LDLCALC 74 06/04/2017   Lab Results  Component Value Date   TSH 2.863 06/03/2017    Therapeutic Level Labs: No results found for: "LITHIUM" No results found for: "VALPROATE" No results found for: "CBMZ"  Current Medications: Current Outpatient Medications  Medication Sig Dispense Refill   ARIPiprazole (ABILIFY) 10  MG tablet Take 1 tablet (10 mg total) by mouth daily for 120 doses. 120 tablet 0   ascorbic acid (VITAMIN C) 1000 MG tablet Take by mouth.     Calcium Citrate-Vitamin D 315-250 MG-UNIT TABS Take by mouth.     ciprofloxacin-dexamethasone (CIPRODEX) OTIC suspension SMARTSIG:In Ear(s)     clomiPRAMINE (ANAFRANIL) 25 MG capsule Take 1 capsule (25 mg total) by mouth at bedtime. 120 capsule 0   ketoconazole (NIZORAL) 2 % cream      metroNIDAZOLE (METROGEL) 0.75 % vaginal gel Insert 1 applicator vaginally every night x 5 nights     norgestimate-ethinyl estradiol (ORTHO-CYCLEN) 0.25-35 MG-MCG tablet Take 1 tablet by mouth daily.     Omega-3 Fatty Acids (FISH OIL) 1000 MG CAPS Take by mouth.     omeprazole (PRILOSEC) 20 MG capsule Take by mouth.     simethicone (GAS-X) 80 MG chewable tablet Chew 1 tablet (80 mg total) by mouth every 6 (six) hours as needed for flatulence. 30 tablet 0   tiZANidine (ZANAFLEX) 2 MG tablet Take 1-2 tablets at night as needed by mouth before bed for back pain or muscle spasms     No current facility-administered  medications for this visit.   Musculoskeletal: Strength & Muscle Tone: Telepsych visit-Grossly normal Musculoskeletal and cranial nerve inspections Gait & Station: NA Patient leans: N/A  Psychiatric Specialty Exam: Review of Systems  Constitutional:  Negative for activity change, appetite change, chills, diaphoresis, fatigue, fever and unexpected weight change.  HENT: Negative.    Eyes: Negative.   Respiratory: Negative.    Cardiovascular: Negative.   Gastrointestinal:  Positive for abdominal pain (GERD). Negative for abdominal distention, anal bleeding, blood in stool, constipation, diarrhea, nausea, rectal pain and vomiting.  Endocrine: Negative for cold intolerance, heat intolerance, polydipsia, polyphagia and polyuria.  Genitourinary: Negative.   Musculoskeletal: Negative.   Skin:  Negative for color change, pallor, rash and wound.  Allergic/Immunologic: Negative for environmental allergies, food allergies and immunocompromised state.  Neurological:  Negative for dizziness, tremors, seizures, syncope, facial asymmetry, speech difficulty, weakness, light-headedness, numbness and headaches.  Hematological:  Negative for adenopathy. Does not bruise/bleed easily.  Psychiatric/Behavioral:  Negative for agitation, behavioral problems, confusion, decreased concentration, dysphoric mood, hallucinations, self-injury, sleep disturbance and suicidal ideas. The patient is not nervous/anxious and is not hyperactive.     There were no vitals taken for this visit.There is no height or weight on file to calculate BMI.My Chart  General Appearance: Casual and Well Groomed  Eye Contact:  Good  Speech:  Clear and Coherent  Volume:  Normal  Mood:  Euthymic  Affect:  Appropriate and Congruent  Thought Process:  Coherent, Goal Directed, and Descriptions of Associations: Intact  Orientation:  Full (Time, Place, and Person)  Thought Content: WDL and Logical   Suicidal Thoughts:  No  Homicidal Thoughts:   No  Memory:  Negative  Judgement:  Intact  Insight:   Limited  Psychomotor Activity:  Normal  Concentration:  Concentration: Good and Attention Span: Good  Recall:  Negative  Fund of Knowledge:  WDL  Language:  Bilingual Spanish primary  Akathisia:  Negative  Handed:  Right  AIMS (if indicated): NA  Assets:  Desire for Improvement Financial Resources/Insurance Housing Leisure Time Resilience Social Support Talents/Skills Transportation  ADL's:  Intact  Cognition: WNL  Sleep:   No complaint   Screenings: [place] Office Visit from 09/06/2019 in [place] at [place] Visit from 07/13/2018  in [place] at [place] Visit from 01/19/2018 in [place] at [facility] Visit from 10/27/2017 in [place] at [facility] Admission (Discharged) from 06/02/2017 in [place] [facility]  AIMS Total Score 0 0 0 0 0      AUDIT    Flowsheet Row Admission (Discharged) from 06/02/2017 in [place] [facility]  Alcohol Use Disorder Identification Test Final Score (AUDIT) 0      Flowsheet Row ED from 12/03/2021 in [place] No Risk        Assessment  Stable and doing well   and Plan:  Continue current medications and FU in 4 mos-sooner if necessary   ADDENDUM 01/10/2023 CAll on private line from Pt and Mother-[facility] in [place] does not have her medication (Clomipramine) and she has her last dose tonite.Mom called and found med at [facility] in [place] Rx sent  [name], PA-C 01/06/2023, 5:02 PM

## 2023-01-06 NOTE — Telephone Encounter (Signed)
Has appt

## 2023-01-10 MED ORDER — CLOMIPRAMINE HCL 25 MG PO CAPS: 25.0000 mg | ORAL_CAPSULE | Freq: Every day | ORAL | 0 refills | Status: DC

## 2023-01-10 MED ORDER — ARIPIPRAZOLE 10 MG PO TABS: 10.0000 mg | ORAL_TABLET | Freq: Every day | ORAL | 0 refills | Status: DC

## 2023-05-03 ENCOUNTER — Other Ambulatory Visit (HOSPITAL_COMMUNITY): Payer: Self-pay | Admitting: Medical

## 2023-05-05 ENCOUNTER — Other Ambulatory Visit (HOSPITAL_COMMUNITY): Payer: Self-pay | Admitting: Medical

## 2023-05-05 MED ORDER — CLOMIPRAMINE HCL 25 MG PO CAPS: 25.0000 mg | ORAL_CAPSULE | Freq: Every day | ORAL | 0 refills | Status: DC

## 2023-05-05 MED ORDER — ARIPIPRAZOLE 10 MG PO TABS: 10.0000 mg | ORAL_TABLET | Freq: Every day | ORAL | 0 refills | Status: DC

## 2023-05-05 NOTE — Progress Notes (Signed)
Pt mischeduled for Friday 7/12 rather than today which would have been a 4 month FU. She was contacted by front desk and is at work today and away next week.  Will reschedule in 3 weeks and provide meds to cover until then.

## 2023-05-05 NOTE — Telephone Encounter (Signed)
Pt has appt today

## 2023-05-06 ENCOUNTER — Telehealth (HOSPITAL_COMMUNITY): Payer: 59 | Admitting: Medical

## 2023-05-09 ENCOUNTER — Telehealth (HOSPITAL_COMMUNITY): Payer: Self-pay | Admitting: *Deleted

## 2023-05-09 NOTE — Telephone Encounter (Signed)
Pt called requesting full refill of the Abilify 10 mg, #21 sent on 05/05/23, as pt is going to [place] from 05/13/23 through 05/27/23 an will not have enough medication. Pt has an appointment upcoming on 06/02/23. Please review and advise. Thanks.

## 2023-05-11 ENCOUNTER — Telehealth (HOSPITAL_COMMUNITY): Payer: Self-pay | Admitting: *Deleted

## 2023-05-11 ENCOUNTER — Other Ambulatory Visit (HOSPITAL_COMMUNITY): Payer: Self-pay | Admitting: Medical

## 2023-05-11 ENCOUNTER — Other Ambulatory Visit (HOSPITAL_COMMUNITY): Payer: Self-pay | Admitting: *Deleted

## 2023-05-11 MED ORDER — ARIPIPRAZOLE 10 MG PO TABS: 10.0000 mg | ORAL_TABLET | Freq: Every day | ORAL | 0 refills | Status: DC

## 2023-05-11 NOTE — Telephone Encounter (Signed)
LVM for pt.

## 2023-05-11 NOTE — Progress Notes (Signed)
[facility] Abil[facility] # 23 10 mg until FU 8/8

## 2023-05-23 NOTE — Telephone Encounter (Signed)
Opened in error

## 2023-06-02 ENCOUNTER — Encounter (HOSPITAL_COMMUNITY): Payer: Self-pay | Admitting: Medical

## 2023-06-02 ENCOUNTER — Telehealth (HOSPITAL_BASED_OUTPATIENT_CLINIC_OR_DEPARTMENT_OTHER): Payer: 59 | Admitting: Medical

## 2023-06-02 VITALS — BP 122/84 | HR 107 | Ht 61.0 in | Wt 208.2 lb

## 2023-06-02 DIAGNOSIS — F258 Other schizoaffective disorders: Secondary | ICD-10-CM | POA: Diagnosis not present

## 2023-06-02 DIAGNOSIS — K7581 Nonalcoholic steatohepatitis (NASH): Secondary | ICD-10-CM

## 2023-06-02 DIAGNOSIS — Z6835 Body mass index (BMI) 35.0-35.9, adult: Secondary | ICD-10-CM

## 2023-06-02 DIAGNOSIS — E669 Obesity, unspecified: Secondary | ICD-10-CM

## 2023-06-02 DIAGNOSIS — F411 Generalized anxiety disorder: Secondary | ICD-10-CM

## 2023-06-02 DIAGNOSIS — Z79899 Other long term (current) drug therapy: Secondary | ICD-10-CM

## 2023-06-02 DIAGNOSIS — R7303 Prediabetes: Secondary | ICD-10-CM

## 2023-06-02 DIAGNOSIS — F429 Obsessive-compulsive disorder, unspecified: Secondary | ICD-10-CM | POA: Diagnosis not present

## 2023-06-02 DIAGNOSIS — E282 Polycystic ovarian syndrome: Secondary | ICD-10-CM

## 2023-06-02 MED ORDER — ARIPIPRAZOLE 10 MG PO TABS: 10.0000 mg | ORAL_TABLET | Freq: Every day | ORAL | 1 refills | Status: DC

## 2023-06-02 MED ORDER — CLOMIPRAMINE HCL 25 MG PO CAPS: 25.0000 mg | ORAL_CAPSULE | Freq: Every day | ORAL | 1 refills | Status: DC

## 2023-06-02 NOTE — Progress Notes (Signed)
[facility] MD/PA/NP OP Progress Note  06/02/2023 4:00 PM Swapna Grech  MRN:  [id number] Virtual Visit via Video Note  I connected with Valerie [name] on 06/02/23 at  3:30 PM EDT by a video enabled telemedicine application and verified that I am speaking with the correct person using two identifiers.  Location: Patient: At Home (with Mom) Provider: [facility] [place]   I discussed the limitations of evaluation and management by telemedicine and the availability of in person appointments. The patient expressed understanding and agreed to proceed.   History of Present Illness:See EPIC note    Observations/Objective:See EPIC note   Assessment and Plan:See EPIC note   Follow Up Instructions:See EPIC note   I discussed the assessment and treatment plan with the patient. The patient was provided an opportunity to ask questions and all were answered. The patient agreed with the plan and demonstrated an understanding of the instructions.   The patient was advised to call back or seek an in-person evaluation if the symptoms worsen or if the condition fails to improve as anticipated.  I provided 20 minutes of non-face-to-face time during this encounter.   [name], PA-C   Chief Complaint:  Chief Complaint  Patient presents with   Follow-up   Medication Refill   Schizoaffective DO   OCD   Anxiety   HPI: Valerie [name] returns today for scheduled FU for Schizoaffective disorder with associated OCD traits and medication management.She has had no further episodes of psychosis since restarting her medication that she stopped on her own in February of 2023 She recently took a trip to see family in [place]. 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 She is not currently dating  is on OBCP for her PCOS. She has not had a visit for her wgt since April 2024. She continues to be stable with her current meds and without side effect. She wishes to continue with same  Visit Diagnosis:     ICD-10-CM   1. Other schizoaffective disorders (HCC)  F25.8     2. Obsessive-compulsive disorder, unspecified type  F42.9     3. GAD (generalized anxiety disorder)  F41.1     4. Medication management  Z79.899     5. Steatohepatitis  K75.81     6. Class 2 obesity without serious comorbidity with body mass index (BMI) of 35.0 to 35.9 in adult, unspecified obesity type  E66.9    Z68.35     7. Prediabetes  R73.03     8. PCOS (polycystic ovarian syndrome)  E28.2       Past Psychiatric History:  OUTPATIENT 03/20/2015 to Present  [facility] Patient and father noted that 1st episode began in November with a few days of irritability, anxiety, depression, and anger. This resolved.  She has had two more severe episodes since then between February and now where she is irritable, short tempered, appears to be responding to internal stimulation, reporting auditory hallucinations, cursing at family. Her grades have dropped from As and Bs to possibly needing Summer school.  Followed from 03/20/2015 to 02/24/2017 when she appeared in complete remission Returned to [facility] OP aftercare for recurrence 05/2017 to present INPATIENT Date of Admission:  06/02/2017 Date of Discharge: 06/10/2017 Discharge Diagnoses:   Diagnosis Date Noted   Psychosis [F29] 06/03/2017   MDD (major depressive disorder), recurrent episode, severe (HCC) [name]      Past Medical History:  Care Everywhere: Encounter Details Date Type Department Care  Team Description  05/30/2023 2:00 PM EDT Annual Physical [facility] - [facility]  [address]  [place], [place] [address]  [phone]  Jonette Mate  [address]  [place], [place] [address]  [phone] (Work)  [phone] (Fax)  Well woman exam (Primary Dx); Encounter for screening breast examination; Cervical cancer screening; PCOS (polycystic ovarian syndrome)  Visit Diagnoses - documented  in this encounter  Visit Diagnoses Diagnosis  Well woman exam - Primary  Routine general medical examination at a health care facility   Encounter for screening breast examination   Cervical cancer screening  Screening for malignant neoplasm of the cervix   PCOS (polycystic ovarian syndrome)  Polycystic ovaries    Past Medical History:  Diagnosis Date   Anxiety    Depression    Fatigue    Headache    Psychosis (HCC) 06/03/2017   History reviewed. No pertinent surgical history.  Family Psychiatric History:  Mental illness Maternal Uncle      Family History:  NOVANT Medical History Relation Name Comments  Stroke Maternal Grandfather      Asthma Maternal Grandmother      Mental illness Maternal Uncle      Diabetes Paternal Grandmother      Hypertension Paternal Grandmother        Social History:  Social History   Socioeconomic History   Marital status: Single    Spouse name: Not on file   Number of children: Not on file   Years of education: Not on file   Highest education level: Not on file  Occupational History   Not on file  Tobacco Use   Smoking status: Never   Smokeless tobacco: Never  Vaping Use   Vaping status: Never Used  Substance and Sexual Activity   Alcohol use: No   Drug use: No   Sexual activity: Not Currently    Birth control/protection: Pill, Abstinence  Other Topics Concern   Not on file  Social History Narrative   Not on file   Social Determinants of Health   Financial Resource Strain: Low Risk  (05/27/2023)   Received from [facility]   Overall Financial Resource Strain (CARDIA)    Difficulty of Paying Living Expenses: Not hard at all  Food Insecurity: Unknown (05/27/2023)   Received from [facility]   Hunger Vital Sign    Worried About Running Out of Food in the Last Year: Never true    Ran Out of Food in the Last Year: Patient declined  Transportation Needs: Unknown (05/27/2023)   Received from [facility] -  Transportation    Lack of Transportation (Medical): Patient declined    Lack of Transportation (Non-Medical): No  Physical Activity: Unknown (05/27/2023)   Received from [facility]   Exercise Vital Sign    Days of Exercise per Week: Patient declined    Minutes of Exercise per Session: 60 min  Stress: Patient Declined (05/27/2023)   Received from [facility] of Occupational [facility] - Occupational Stress Questionnaire    Feeling of Stress : Patient declined  Social Connections: Socially Integrated (05/27/2023)   Received from [facility]   Social Network    How would you rate your social network (family, work, friends)?: Good participation with social networks    Allergies:  Allergies  Allergen Reactions   Sulfa Antibiotics Hives and Itching    Hives on face    Metabolic Disorder Labs: Lab Results  Component Value  Date   HGBA1C 5.3 06/03/2017   MPG 105.41 06/03/2017   Lab Results  Component Value Date   PROLACTIN 79.1 (H) 06/04/2017   Lab Results  Component Value Date   CHOL 172 (H) 06/04/2017   TRIG 144 06/04/2017   HDL 69 06/04/2017   CHOLHDL 2.5 06/04/2017   VLDL 29 06/04/2017   LDLCALC 74 06/04/2017   Lab Results  Component Value Date   TSH 2.863 06/03/2017    Therapeutic Level Labs: No results found for: "LITHIUM" No results found for: "VALPROATE" No results found for: "CBMZ"  Current Medications: Current Outpatient Medications  Medication Sig Dispense Refill   ARIPiprazole (ABILIFY) 10 MG tablet Take 1 tablet (10 mg total) by mouth daily. 90 tablet 1   ascorbic acid (VITAMIN C) 1000 MG tablet Take by mouth.     Calcium Citrate-Vitamin D 315-250 MG-UNIT TABS Take by mouth.     ciprofloxacin-dexamethasone (CIPRODEX) OTIC suspension SMARTSIG:In Ear(s)     clomiPRAMINE (ANAFRANIL) 25 MG capsule Take 1 capsule (25 mg total) by mouth at bedtime. 90 capsule 1   ketoconazole (NIZORAL) 2 % cream      metroNIDAZOLE (METROGEL) 0.75 % vaginal  gel Insert 1 applicator vaginally every night x 5 nights     norgestimate-ethinyl estradiol (ORTHO-CYCLEN) 0.25-35 MG-MCG tablet Take 1 tablet by mouth daily.     Omega-3 Fatty Acids (FISH OIL) 1000 MG CAPS Take by mouth.     omeprazole (PRILOSEC) 20 MG capsule Take by mouth.     simethicone (GAS-X) 80 MG chewable tablet Chew 1 tablet (80 mg total) by mouth every 6 (six) hours as needed for flatulence. 30 tablet 0   tiZANidine (ZANAFLEX) 2 MG tablet Take 1-2 tablets at night as needed by mouth before bed for back pain or muscle spasms     No current facility-administered medications for this visit.    Musculoskeletal: Strength & Muscle Tone: Telepsych visit-Grossly normal Musculoskeletal and cranial nerve inspections Gait & Station: NA Patient leans: N/A  Psychiatric Specialty Exam: Review of Systems  Constitutional:  Positive for activity change (working at [employer]). Negative for appetite change, chills, diaphoresis, fatigue, fever and unexpected weight change.  Gastrointestinal:        Central obesity  Endocrine: Negative for cold intolerance, heat intolerance, polydipsia, polyphagia and polyuria.  Genitourinary:  Negative for decreased urine volume, difficulty urinating, dyspareunia, dysuria, enuresis, flank pain, frequency, genital sores, hematuria, menstrual problem, pelvic pain, urgency, vaginal bleeding, vaginal discharge and vaginal pain.       PCOS PAP 05/30/2023 WNL  Allergic/Immunologic: Negative for environmental allergies, food allergies and immunocompromised state.  Neurological:  Negative for dizziness, tremors, seizures, syncope, facial asymmetry, speech difficulty, weakness, light-headedness, numbness and headaches.  Hematological:  Negative for adenopathy. Does not bruise/bleed easily.  Psychiatric/Behavioral:  Positive for sleep disturbance. Negative for agitation, behavioral problems, confusion, decreased concentration, dysphoric mood, hallucinations, self-injury and  suicidal ideas. The patient is nervous/anxious. The patient is not hyperactive.   All other systems reviewed and are negative.   Blood pressure 122/84, pulse (!) 107, height 5\' 1"  (1.549 m), weight 208 lb 3.2 oz (94.4 kg), last menstrual period 05/14/2023.Body mass index is 39.34 kg/m.  General Appearance: Well Groomed  Eye Contact:  Good  Speech:  Clear and Coherent and Normal Rate  Volume:  Normal  Mood:  Euthymic  Affect:  Appropriate and Full Range  Thought Process:  Coherent, Goal Directed, and Descriptions of Associations: Intact  Orientation:  Full (Time, Place, and Person)  Thought Content: WDL   Suicidal Thoughts:  No  Homicidal Thoughts:  No  Memory:  Negative  Judgement:  Intact  Insight:  Fair  Psychomotor Activity:  Negative  Concentration:  Concentration: Good and Attention Span: Good  Recall:  Negative  Fund of Knowledge:  WDL  Language: Good Bilingual  Akathisia:  NA  Handed:  Right  AIMS (if indicated): NA  Assets:  Desire for Improvement Financial Resources/Insurance Housing Resilience Social Support Transportation  ADL's:  Intact  Cognition: Impaired,  Mild and Moderate  Sleep:  Negative   Screenings: AIMS    Flowsheet Row Office Visit from 09/06/2019 in [facility] Office Visit from 07/13/2018 in [facility] Office Visit from 01/19/2018 in [facility] Office Visit from 10/27/2017 in [facility] Admission (Discharged) [facility]  AIMS Total Score 0 0 0 0 0      AUDIT    Flowsheet Row Admission (Discharged) from 06/02/2017 in [facility]  Alcohol Use Disorder Identification Test Final Score (AUDIT) 0      Flowsheet Row ED from 12/03/2021 in [facility]  C-SSRS RISK CATEGORY No Risk        Assessment Stable and improving socially     and Plan: Continue current medications, FU with Providers as scheduled FU here 6 mos sooner if neded    [name], PA-C 06/02/2023, 4:00 PM

## 2023-08-28 ENCOUNTER — Other Ambulatory Visit (HOSPITAL_COMMUNITY): Payer: Self-pay | Admitting: Medical

## 2023-09-01 ENCOUNTER — Telehealth (HOSPITAL_COMMUNITY): Payer: 59 | Admitting: Medical

## 2023-09-01 ENCOUNTER — Encounter (HOSPITAL_COMMUNITY): Payer: Self-pay | Admitting: Medical

## 2023-09-01 DIAGNOSIS — F429 Obsessive-compulsive disorder, unspecified: Secondary | ICD-10-CM

## 2023-09-01 DIAGNOSIS — R7303 Prediabetes: Secondary | ICD-10-CM

## 2023-09-01 DIAGNOSIS — F258 Other schizoaffective disorders: Secondary | ICD-10-CM | POA: Diagnosis not present

## 2023-09-01 DIAGNOSIS — F411 Generalized anxiety disorder: Secondary | ICD-10-CM | POA: Diagnosis not present

## 2023-09-01 DIAGNOSIS — K7581 Nonalcoholic steatohepatitis (NASH): Secondary | ICD-10-CM | POA: Diagnosis not present

## 2023-09-01 DIAGNOSIS — E282 Polycystic ovarian syndrome: Secondary | ICD-10-CM

## 2023-09-01 DIAGNOSIS — Z6835 Body mass index (BMI) 35.0-35.9, adult: Secondary | ICD-10-CM

## 2023-09-01 DIAGNOSIS — E66812 Obesity, class 2: Secondary | ICD-10-CM

## 2023-09-01 DIAGNOSIS — Z79899 Other long term (current) drug therapy: Secondary | ICD-10-CM

## 2023-09-01 MED ORDER — ARIPIPRAZOLE 10 MG PO TABS: 10.0000 mg | ORAL_TABLET | Freq: Every day | ORAL | 1 refills | Status: DC

## 2023-09-01 MED ORDER — CLOMIPRAMINE HCL 25 MG PO CAPS: 25.0000 mg | ORAL_CAPSULE | Freq: Every day | ORAL | 1 refills | Status: DC

## 2023-09-01 NOTE — Progress Notes (Signed)
BH MD/PA/NP OP Progress Note  09/01/2023 5:14 PM Valerie [name]  MRN:  [id number] Virtual Visit via Video Note  I connected with Valerie [name] on 09/01/23 at  5:00 PM EST by a video enabled telemedicine application and verified that I am speaking with the correct person using two identifiers.  Location: Patient: Home Provider: [facility]   I discussed the limitations of evaluation and management by telemedicine and the availability of in person appointments. The patient expressed understanding and agreed to proceed.   History of Present Illness:See EPIC note    Observations/Objective:See EPIC note   Assessment and Plan:See EPIC note   Follow Up Instructions:See EPIC note    I discussed the assessment and treatment plan with the patient. The patient was provided an opportunity to ask questions and all were answered. The patient agreed with the plan and demonstrated an understanding of the instructions.   The patient was advised to call back or seek an in-person evaluation if the symptoms worsen or if the condition fails to improve as anticipated.  I provided 20 minutes of non-face-to-face time during this encounter.   [name], PA-C   Chief Complaint:  Chief Complaint  Patient presents with   Follow-up   Medication Refill   Schizoaffective   OCD   HPI: Valerie [name] returns for FU for Schizoaffective disorder with associated OCD traits and medication management.She has had no further episodes of psychosis since restarting her medication that she stopped on her own in February of 2023  She continues to be symptom free. She driving now and working at [employer]. She is excitedtyo reports she and the family will be returning to [place] home town in [place] for 2 months. She is having no problems with her medications and does not want to change .  Visit Diagnosis:    ICD-10-CM   1. Other schizoaffective disorders (HCC)  F25.8     2. Obsessive-compulsive disorder,  unspecified type  F42.9     3. GAD (generalized anxiety disorder)  F41.1     4. Medication management  Z79.899     5. Steatohepatitis  K75.81     6. Class 2 obesity without serious comorbidity with body mass index (BMI) of 35.0 to 35.9 in adult, unspecified obesity type  E66.812    Z68.35     7. Prediabetes  R73.03     8. PCOS (polycystic ovarian syndrome)  E28.2       Past Psychiatric History:  UTPATIENT 03/20/2015 to Present  [facility] Patient and father noted that 1st episode began in November with a few days of irritability, anxiety, depression, and anger. This resolved.  She has had two more severe episodes since then between February and now where she is irritable, short tempered, appears to be responding to internal stimulation, reporting auditory hallucinations, cursing at family. Her grades have dropped from As and Bs to possibly needing Summer school.  Followed from 03/20/2015 to 02/24/2017 when she appeared in complete remission Returned to [facility] OP aftercare for recurrence 05/2017 to present INPATIENT Date of Admission:  06/02/2017 Date of Discharge: 06/10/2017 Discharge Diagnoses:   Diagnosis Date Noted   Psychosis [F29] 06/03/2017   MDD (major depressive disorder), recurrent episode, severe (HCC) [name]    Past Medical History:  CARE EVERYWHERE: Encounter Details  Encounter Details Date Type Department Care Team Description  08/26/2023 9:00 AM EDT Office Visit [facility]   Woodroe Chen, MD    Acute gastroenteritis (Primary Dx)  Impression   1. Acute gastroenteritis   Plan   She may very well have gastroenteritis associated with something that she ate which did not agree with her. I am choosing to empirically treat her with ciprofloxacin 500 mg twice daily for 1 week for possible bacterial gastroenteritis. She can continue to use over-the-counter products for diarrhea. If she is still having issues after finishing  antibiotics she needs to make me aware so that further assessment can be done.   Past Medical History:  Diagnosis Date   Anxiety    Depression    Fatigue    Headache    Psychosis (HCC) 06/03/2017   No past surgical history on file.  Family Psychiatric History:  Mental illness Maternal Uncle    Family History:  NOVANT Medical History Relation Name Comments  Stroke Maternal Grandfather      Asthma Maternal Grandmother      Mental illness Maternal Uncle      Diabetes Paternal Grandmother      Hypertension Paternal Grandmother        Social History:  Social History   Socioeconomic History   Marital status: Single    Spouse name: NA   Number of children: None   Years of education: 12   Highest education level: Some Quarry manager at [employer]  Tobacco Use   Smoking status: Never   Smokeless tobacco: Never  Vaping Use   Vaping status: Never Used  Substance and Sexual Activity   Alcohol use: No   Drug use: No   Sexual activity: Not Currently    Birth control/protection: Pill, Abstinence  Other Topics Concern   NA  Social History Narrative   Not on file   Social Determinants of Health   Financial Resource Strain: Low Risk  (05/27/2023)   Received from [facility]   Overall Financial Resource Strain (CARDIA)    Difficulty of Paying Living Expenses: Not hard at all  Food Insecurity: Unknown (05/27/2023)   Received from [facility]   Hunger Vital Sign    Worried About Running Out of Food in the Last Year: Never true    Ran Out of Food in the Last Year: Patient declined  Transportation Needs: Unknown (05/27/2023)   Received from [facility] - Transportation    Lack of Transportation (Medical): Patient declined    Lack of Transportation (Non-Medical): No  Physical Activity: Unknown (05/27/2023)   Received from [facility]   Exercise Vital Sign    Days of Exercise per Week: Patient declined    Minutes of Exercise per  Session: 60 min  Stress: Patient Declined (05/27/2023)   Received from [facility] - Occupational Stress Questionnaire    Feeling of Stress : Patient declined  Social Connections: Socially Integrated (05/27/2023)   Received from [facility]   Social Network    How would you rate your social network (family, work, friends)?: Good participation with social networks    Allergies:  Allergies  Allergen Reactions   Sulfa Antibiotics Hives and Itching    Hives on face    Metabolic Disorder Labs: Lab Results  Component Value Date   HGBA1C 5.3 06/03/2017   MPG 105.41 06/03/2017   Lab Results  Component Value Date   PROLACTIN 79.1 (H) 06/04/2017   Lab Results  Component Value Date   CHOL 172 (H) 06/04/2017   TRIG 144 06/04/2017   HDL 69 06/04/2017  CHOLHDL 2.5 06/04/2017   VLDL 29 06/04/2017   LDLCALC 74 06/04/2017   Lab Results  Component Value Date   TSH 2.863 06/03/2017    Therapeutic Level Labs: No results found for: "LITHIUM" No results found for: "VALPROATE" No results found for: "CBMZ"  Current Medications: Current Outpatient Medications  Medication Sig Dispense Refill   ARIPiprazole (ABILIFY) 10 MG tablet Take 1 tablet (10 mg total) by mouth daily. 90 tablet 1   ascorbic acid (VITAMIN C) 1000 MG tablet Take by mouth.     Calcium Citrate-Vitamin D 315-250 MG-UNIT TABS Take by mouth.     ciprofloxacin-dexamethasone (CIPRODEX) OTIC suspension SMARTSIG:In Ear(s)     clomiPRAMINE (ANAFRANIL) 25 MG capsule Take 1 capsule (25 mg total) by mouth at bedtime. 90 capsule 1   ketoconazole (NIZORAL) 2 % cream      metroNIDAZOLE (METROGEL) 0.75 % vaginal gel Insert 1 applicator vaginally every night x 5 nights     norgestimate-ethinyl estradiol (ORTHO-CYCLEN) 0.25-35 MG-MCG tablet Take 1 tablet by mouth daily.     Omega-3 Fatty Acids (FISH OIL) 1000 MG CAPS Take by mouth.     omeprazole (PRILOSEC) 20 MG capsule Take by mouth.      simethicone (GAS-X) 80 MG chewable tablet Chew 1 tablet (80 mg total) by mouth every 6 (six) hours as needed for flatulence. 30 tablet 0   tiZANidine (ZANAFLEX) 2 MG tablet Take 1-2 tablets at night as needed by mouth before bed for back pain or muscle spasms     No current facility-administered medications for this visit.      Musculoskeletal: Strength & Muscle Tone: Telepsych visit-Grossly normal Musculoskeletal and cranial nerve inspections Gait & Station: NA Patient leans: N/A   Psychiatric Specialty Exam: Review of Systems  Constitutional:  Positive for activity change (going to [place] for vacation). Negative for appetite change, chills, diaphoresis, fatigue, fever and unexpected weight change.  Respiratory:  Negative for apnea, cough, choking, chest tightness, shortness of breath, wheezing and stridor.   Gastrointestinal:  Negative for abdominal distention, abdominal pain, anal bleeding, blood in stool, constipation, diarrhea, nausea, rectal pain and vomiting.       Date Type Department Care Team Description 08/26/2023 9:00 AM EDT Office Visit [facility], [place], [place]    Acute gastroenteritis (Primary Dx)   Endocrine: Negative for cold intolerance, heat intolerance, polydipsia, polyphagia and polyuria.  Genitourinary:  Negative for decreased urine volume, difficulty urinating, dyspareunia, dysuria, enuresis, flank pain, frequency, genital sores, hematuria, menstrual problem, pelvic pain, urgency, vaginal bleeding, vaginal discharge and vaginal pain.  Allergic/Immunologic: Negative.   Neurological:  Negative for dizziness, tremors, seizures, syncope, facial asymmetry, speech difficulty, weakness, light-headedness, numbness and headaches.  Psychiatric/Behavioral:  Negative for agitation, behavioral problems, confusion, decreased concentration, dysphoric mood, hallucinations, self-injury, sleep disturbance and suicidal ideas. The patient is not nervous/anxious and is not  hyperactive.   All other systems reviewed and are negative.   There were no vitals taken for this visit.There is no height or weight on file to calculate BMI.Last Filed Vital Signs - documented in this encounter  Last Filed Vital Signs  [facility]   Vital Sign Reading Time Taken   Blood Pressure 120/76 08/26/2023 9:12 AM EDT   Pulse 110 08/26/2023 9:12 AM EDT   Temperature - -   Respiratory Rate 16 08/26/2023 9:12 AM EDT   Oxygen Saturation 94% 08/26/2023 9:12 AM EDT   Inhaled Oxygen Concentration - -   Weight 94.9 kg (209  lb 3.2 oz) 08/26/2023 9:12 AM EDT   Height 154.9 cm (5\' 1" ) 08/26/2023 9:12 AM EDT   Body Mass Index 39.53      General Appearance: Casual and Neat  Eye Contact:  Good  Speech:  Clear and Coherent and Normal Rate  Volume:  Normal  Mood:  Euthymic  Affect:  Appropriate and Congruent  Thought Process:  Coherent, Goal Directed, and Descriptions of Associations: Intact  Orientation:  Full (Time, Place, and Person)  Thought Content: WDL and Logical   Suicidal Thoughts:  No  Homicidal Thoughts:  No  Memory:  Negative  Judgement:  Intact  Insight:  Present  Psychomotor Activity:  Normal  Concentration:  Concentration: Good and Attention Span: Good  Recall:  Good  Fund of Knowledge:  WDL  Language: Good  Akathisia:  Negative  Handed:  Right  AIMS (if indicated): Grossly negative  Assets:  Communication Skills Desire for Improvement Financial Resources/Insurance Housing Resilience Social Support Talents/Skills Transportation Vocational/Educational  ADL's:  Intact  Cognition: WNL  Sleep:  Good   Screenings: AIMS    Flowsheet Row Office Visit from 09/06/2019 in [facility] Office Visit from 07/13/2018 in [facility] Office Visit from 01/19/2018 in [facility]  Office Visit from 10/27/2017 in [facility] Admission (Discharged) [facility]  AIMS Total Score 0 0 0 0 0      AUDIT    Flowsheet Row Admission (Discharged) from [facility]02/2017 [facility]  Alcohol Use Disorder Identification Test Final Score (AUDIT) 0      Flowsheet Row ED from 12/03/2021 in [facility]  C-SSRS RISK CATEGORY No Risk        Assessment : Stable and doing well    and Plan: Refill meds  FU 4 mos sooner if needed [place]    [name], PA-C 09/01/2023, 5:14 PM

## 2023-09-01 NOTE — Telephone Encounter (Signed)
Pt has rx from 05/2023 visit

## 2023-12-08 ENCOUNTER — Telehealth (HOSPITAL_COMMUNITY): Payer: 59 | Admitting: Medical

## 2023-12-08 ENCOUNTER — Encounter (HOSPITAL_COMMUNITY): Payer: Self-pay | Admitting: Medical

## 2023-12-08 VITALS — BP 120/76 | HR 110 | Ht 61.0 in | Wt 209.2 lb

## 2023-12-08 DIAGNOSIS — F429 Obsessive-compulsive disorder, unspecified: Secondary | ICD-10-CM

## 2023-12-08 DIAGNOSIS — R748 Abnormal levels of other serum enzymes: Secondary | ICD-10-CM

## 2023-12-08 DIAGNOSIS — F411 Generalized anxiety disorder: Secondary | ICD-10-CM

## 2023-12-08 DIAGNOSIS — K7581 Nonalcoholic steatohepatitis (NASH): Secondary | ICD-10-CM

## 2023-12-08 DIAGNOSIS — E66812 Obesity, class 2: Secondary | ICD-10-CM

## 2023-12-08 DIAGNOSIS — Z6835 Body mass index (BMI) 35.0-35.9, adult: Secondary | ICD-10-CM

## 2023-12-08 DIAGNOSIS — F258 Other schizoaffective disorders: Secondary | ICD-10-CM

## 2023-12-08 DIAGNOSIS — E282 Polycystic ovarian syndrome: Secondary | ICD-10-CM

## 2023-12-08 DIAGNOSIS — Z79899 Other long term (current) drug therapy: Secondary | ICD-10-CM

## 2023-12-08 DIAGNOSIS — R7303 Prediabetes: Secondary | ICD-10-CM

## 2023-12-08 MED ORDER — CLOMIPRAMINE HCL 25 MG PO CAPS: 25.0000 mg | ORAL_CAPSULE | Freq: Every day | ORAL | 1 refills | Status: DC

## 2023-12-08 MED ORDER — ARIPIPRAZOLE 10 MG PO TABS: 10.0000 mg | ORAL_TABLET | Freq: Every day | ORAL | 1 refills | Status: DC

## 2023-12-08 NOTE — Progress Notes (Signed)
BH MD/PA/NP OP Progress Note  12/08/2023 5:17 PM Valerie [name]  MRN:  [id number] Virtual Visit via Video Note  I connected with Valerie [name] on 12/08/23 at  4:00 PM EST by a video enabled telemedicine application and verified that I am speaking with the correct person using two identifiers.  Location: Patient: At home Provider: [facility] [place]   I discussed the limitations of evaluation and management by telemedicine and the availability of in person appointments. The patient expressed understanding and agreed to proceed.   History of Present Illness:See EPIC note    Observations/Objective:See EPIC note   Assessment and Plan:See EPIC note   Follow Up Instructions:See EPIC note:    I discussed the assessment and treatment plan with the patient. The patient was provided an opportunity to ask questions and all were answered. The patient agreed with the plan and demonstrated an understanding of the instructions.   The patient was advised to call back or seek an in-person evaluation if the symptoms worsen or if the condition fails to improve as anticipated.  I provided 20 minutes of non-face-to-face time during this encounter.   Valerie Morn, PA-C   Chief Complaint:  Chief Complaint  Patient presents with   Follow-up   Altered Mental Status   Hallucinations   Medication Refill   HPI: Back from Grandfather[place] ranch in [place] Feb 4.Doing well. Back at work. Social life report also positive. No problems with meds.No recurrent hallucinosis Review of [facility] -abnormal labs/A1C SGPT -No FU on LFTs Limited FU on Obesity (Pt refused COREL[facility] counseling)  Visit Diagnosis:    ICD-10-CM   1. Other schizoaffective disorders (HCC)  F25.8     2. Obsessive-compulsive disorder, unspecified type  F42.9     3. GAD (generalized anxiety disorder)  F41.1     4. Medication management  Z79.899     5. Steatohepatitis  K75.81     6. Class 2 obesity without serious comorbidity with  body mass index (BMI) of 35.0 to 35.9 in adult, unspecified obesity type  E66.812    Z68.35     7. Prediabetes  R73.03     8. PCOS (polycystic ovarian syndrome)  E28.2       Past Psychiatric History:  OUTPATIENT 03/20/2015 to Present  [facility] Patient and father noted that 1st episode began in November with a few days of irritability, anxiety, depression, and anger. This resolved.  She has had two more severe episodes since then between February and now where she is irritable, short tempered, appears to be responding to internal stimulation, reporting auditory hallucinations, cursing at family. Her grades have dropped from As and Bs to possibly needing Summer school.  Followed from 03/20/2015 to 02/24/2017 when she appeared in complete remission Returned to [facility] OP aftercare for recurrence 05/2017 to present INPATIENT Date of Admission:  06/02/2017 Date of Discharge: 06/10/2017 Discharge Diagnoses:   Diagnosis Date Noted   Psychosis [F29] 06/03/2017   MDD (major depressive disorder), recurrent episode, severe (HCC) Valerie [name]      Past Medical History:  CARE EVERYWHERE:  See HPI    05/30/2023 2:00 PM EDT Annual Physical  Assessment and Plan 1. Well woman exam   2. Encounter for screening breast examination   3. Cervical cancer screening Liquid-Based Pap Smear, Screening   4. PCOS (polycystic ovarian syndrome) drospirenone-ethinyl estradiol (LORYNA) 3-0.02 MG per tablet    Well woman exam (Primary Dx);  Encounter for screening breast examination;  Cervical cancer screening;  PCOS (polycystic ovarian syndrome)   COUNSELING: PNV/Daily multivitamin encouraged.  Self breast awareness encouraged.  Sunscreen use/skin check encouraged. Healthy diet and routine exercise encouraged - discussed modifiable risk factors to prevent CV disease.  Safe sex practices encouraged.  Tobacco and drug avoidance/cessation encouraged Automobile safety: regular  seatbelt use and avoidance of distracted driving ASCCP guidelines reviewed and HPV testing discussed  Follow up in about 1 year (around 05/29/2024) for Annual Well Woman Visit.  Well woman exam (Primary Dx); Encounter for screening breast examination; Cervical cancer screening; PCOS (polycystic ovarian syndrome)   Past Medical History:  Diagnosis Date   Anxiety    Depression    Fatigue    Headache    Psychosis (HCC) 06/03/2017   History reviewed. No pertinent surgical history.   Family Psychiatric History:  Mental illness Maternal Uncle    Family History:  NOVANT Medical History Relation Name Comments  Stroke Maternal Grandfather      Asthma Maternal Grandmother      Mental illness Maternal Uncle      Diabetes Paternal Grandmother      Hypertension Paternal Grandmother        Social History:  Social History         Socioeconomic History   Marital status: Single      Spouse name: NA   Number of children: None   Years of education: 12   Highest education level: Some Quarry manager at [employer]  Tobacco Use   Smoking status: Never   Smokeless tobacco: Never  Vaping Use   Vaping status: Never Used  Substance and Sexual Activity   Alcohol use: No   Drug use: No   Sexual activity: Not Currently      Birth control/protection: Pill, Abstinence  Other Topics Concern   NA  Social History Narrative   Not on file    Social Determinants of Health        Financial Resource Strain: Low Risk  (05/27/2023)    Received from [facility]    Overall Financial Resource Strain (CARDIA)     Difficulty of Paying Living Expenses: Not hard at all  Food Insecurity: Unknown (05/27/2023)    Received from [facility]    Hunger Vital Sign     Worried About Running Out of Food in the Last Year: Never true     Ran Out of Food in the Last Year: Patient declined  Transportation Needs: Unknown (05/27/2023)    Received from [facility] -  Transportation     Lack of Transportation (Medical): Patient declined     Lack of Transportation (Non-Medical): No  Physical Activity: Unknown (05/27/2023)    Received from [facility]    Exercise Vital Sign     Days of Exercise per Week: Patient declined     Minutes of Exercise per Session: 60 min  Stress: Patient Declined (05/27/2023)    Received from [facility] - Occupational Stress Questionnaire     Feeling of Stress : Patient declined  Social Connections: Socially Integrated (05/27/2023)    Received from [facility]    Social Network     How would you rate your social network (family, work, friends)?: Good participation with social networks    Allergies:  Allergies  Allergen Reactions   Sulfa Antibiotics Hives and Itching    Hives on face    Metabolic Disorder Labs: Lab Results  Component Value Date   HGBA1C 5.3 06/03/2017   MPG 105.41 06/03/2017  Recent Data from [facility] Related to Hemoglobin A1c Component 10/14/22 09/24/21 02/11/15  Hemoglobin A1c 5.9 High   5.6 5.3    Component 05/30/23 10/27/22 09/23/22 01/20/22 01/20/22 11/19/21  Glucose -- -- 89 84 -- 84   Lab Results  Component Value Date   PROLACTIN 79.1 (H) 5.9  06/04/2017 10/27/22 09:23    Lab Results  Component Value Date   CHOL 172 (H) 06/04/2017   TRIG 144 06/04/2017   HDL 69 06/04/2017   CHOLHDL 2.5 06/04/2017   VLDL 29 06/04/2017   LDLCALC 74 06/04/2017  Recent Data from [facility] Related to Lipid Panel Component 10/14/22 09/24/21 09/14/19 02/11/15  Cholesterol, Total 242 High  183 220 High  203 High   Triglycerides 253 High  164 High  323 High  107 High   HDL 55 65 -- 73   VLDL Cholesterol Cal 45 High  28 -- 21  LDL 142 High  90 -- 109   Lab Results  Component Value Date   TSH 2.863 06/03/2017   Recent Data from [facility] Related to [facility] Component 09/23/22 09/24/21 12/10/14  TSH 2.250 1.890 1.48   LIVER ENZYMES                                                                                           Component 05/30/23 10/27/22 09/23/22 01/20/22  11/19/21                         Alkaline Phosphatase 44 - 121 IU/L    AST 0 - 40 IU/L    ALT (SGPT) 0 - 32 IU/L     -- -- 36 Low   40  60 High     30 Low  16    21      30  Low  24  29                     Hep C Antibody : 10/27/22   Non Reactive   Therapeutic Level Labs:NA   Current Medications: Current Outpatient Medications  Medication Sig Dispense Refill   ARIPiprazole (ABILIFY) 10 MG tablet Take 1 tablet (10 mg total) by mouth daily. 90 tablet 1   ascorbic acid (VITAMIN C) 1000 MG tablet Take by mouth.     Calcium Citrate-Vitamin D 315-250 MG-UNIT TABS Take by mouth.     clomiPRAMINE (ANAFRANIL) 25 MG capsule Take 1 capsule (25 mg total) by mouth at bedtime. 90 capsule 1   ketoconazole (NIZORAL) 2 % cream      metroNIDAZOLE (METROGEL) 0.75 % vaginal gel Insert 1 applicator vaginally every night x 5 nights     norgestimate-ethinyl estradiol (ORTHO-CYCLEN) 0.25-35 MG-MCG tablet Take 1 tablet by mouth daily.     Omega-3 Fatty Acids (FISH OIL) 1000 MG CAPS Take by mouth.     tiZANidine (ZANAFLEX) 2 MG tablet Take 1-2 tablets at night as needed by mouth before bed for back pain or muscle spasms     No current facility-administered medications  for this visit.    Musculoskeletal: Strength & Muscle Tone: Telepsych visit-Grossly normal Musculoskeletal and cranial nerve inspections Gait & Station: NA Patient leans: N/A  Psychiatric Specialty Exam: Review of Systems  Constitutional:  Negative for activity change, appetite change, chills, diaphoresis, fatigue, fever and unexpected weight change.  Respiratory:  Negative for apnea, cough, choking, chest tightness, shortness of breath, wheezing and stridor.   Gastrointestinal:  Negative for abdominal distention, abdominal pain, anal bleeding, blood in stool, constipation, diarrhea, nausea, rectal  pain and vomiting.  Endocrine: Negative for cold intolerance, heat intolerance, polydipsia, polyphagia and polyuria.  Neurological:  Negative for dizziness, tremors, seizures, syncope, facial asymmetry, speech difficulty, weakness, light-headedness, numbness and headaches.  Psychiatric/Behavioral:  Negative for agitation, behavioral problems, confusion, decreased concentration, dysphoric mood, hallucinations, self-injury, sleep disturbance and suicidal ideas. The patient is not nervous/anxious and is not hyperactive.   All other systems reviewed and are negative.   There were no vitals taken for this visit.MY CHART Visit Last Filed Vital Signs - documented in this encounter  Last Filed Vital Signs Vital Sign Reading Time Taken Comments  Blood Pressure 120/76 08/26/2023 9:12 AM EDT    Pulse 110 08/26/2023 9:12 AM EDT    Temperature - -    Respiratory Rate 16 08/26/2023 9:12 AM EDT    Oxygen Saturation 94% 08/26/2023 9:12 AM EDT RA  Inhaled Oxygen Concentration - -    Weight 94.9 kg (209 lb 3.2 oz) 08/26/2023 9:12 AM EDT    Height 154.9 cm (5\' 1" ) 08/26/2023 9:12 AM EDT    Body Mass Index 39.53 08/26/2023 9:12 AM EDT      General Appearance: Casual  Eye Contact:  Good  Speech:  Clear and Coherent and Normal Rate  Volume:  Normal  Mood:  Euthymic  Affect:  Congruent  Thought Process:  Coherent and Descriptions of Associations: Intact  Orientation:  Full (Time, Place, and Person)  Thought Content: WDL and Logical   Suicidal Thoughts:  No  Homicidal Thoughts:  No  Memory:  Negative  Judgement:  Fair  Insight:   WDL  very dependent on Mom for some things but does venture out on her own  Psychomotor Activity:  Negative  Concentration:  Concentration: Good and Attention Span: Good  Recall:  Negative  Fund of Knowledge:  WDL  Language: Good  Akathisia: No  Handed:  Right  AIMS (if indicated): not done Grossly normal  Assets:  Desire for Improvement Financial  Resources/Insurance Housing Resilience Social Support Transportation  ADL's:  Impaired Wgt  Cognition: Impaired,  Moderate-lacks understanding of serious health issues   Sleep:  Negative   Screenings:  DEPRESSION SCREENING: PHQ9  10/14/2022 11/13/2021 02/17/2021 12/26/2019  Depression Screen  Please choose the category that best describes the patient's current state 0 0 0 1  Not eligible on the basis of Not applicable Not applicable Not applicable Not applicable  1. Little interest or pleasure in doing things 0 0 0 0  2. Feeling down, depressed, or hopeless 0 0 0 0  PHQ-2 Total Score 0 0 0 0  PHQ-2 Interpretation of Score for Depression (Payor) Negative Negative Negative -  3. Trouble falling or staying asleep - - - 0  4. Feeling tired or having little energy - - - 0  5. Poor appetite or overeating - - - 0  6. Feeling bad about yourself - or that you are a failure or have let yourself or your family down - - - 0  7. Trouble  concentrating on things, such as reading the newspaper or watching television - - - 0  8. Moving or speaking so slowly that other people could have noticed. Or the opposite - being so fidgety or restless that you have been moving around a lot more than usual. - - - 0  9. Thoughts that you would be better off dead, or of hurting yourself in some way. - - - 0  10. How difficult have these problems made it for you to do your work, take care of things at home or get along with other people? - - - Not difficult at all  PHQ Total Score - - - 0  Interpretation: - - - Minimal or No Depression  PHQ-9 Interpretation of Score for Depression (Payor) - - - Negative   AIMS    Flowsheet Row Office Visit from 09/06/2019 in [facility] Office Visit from 07/13/2018 in [facility] Office Visit from 01/19/2018 in [facility]  Office Visit from 10/27/2017 in [facility] Ad[facility] (Dis[facility]) [facility]  AIMS Total Score 0 0 0 0 0      AUDIT    Flowsheet Row Admission (Discharged) from [facility]  Alcohol Use Disorder Identification Test Final Score (AUDIT) 0      Flowsheet Row ED from 12/03/2021 in [facility]  C-SSRS RISK CATEGORY No Risk        Assessment Letetia remains free of psychosis and associated OCD.She is morbidly obese and has abnormal labs/and health conditions associated with her weight that appear to be inadequately addressed .  Her Psychiatric medications (Abilify in particular) may be contributory but last attempt to stop her antipsychotic resulted in full blow relapse (though she wasn't truly tapered as she stopped taking medes on her own without telling anyone)    and Plan: For now will continue her meds. Will discuss with her at next vivit concerns and ask her have her Mom participate as well.FU 6 mos-by then she should have had her Annual Wellness Exam    Valerie Morn, PA-C 12/08/2023, 5:17 PM

## 2024-05-31 ENCOUNTER — Encounter (HOSPITAL_COMMUNITY): Payer: Self-pay | Admitting: Medical

## 2024-05-31 ENCOUNTER — Telehealth (HOSPITAL_COMMUNITY): Payer: 59 | Admitting: Medical

## 2024-05-31 DIAGNOSIS — K7581 Nonalcoholic steatohepatitis (NASH): Secondary | ICD-10-CM

## 2024-05-31 DIAGNOSIS — F258 Other schizoaffective disorders: Secondary | ICD-10-CM

## 2024-05-31 DIAGNOSIS — Z6835 Body mass index (BMI) 35.0-35.9, adult: Secondary | ICD-10-CM

## 2024-05-31 DIAGNOSIS — R7303 Prediabetes: Secondary | ICD-10-CM

## 2024-05-31 DIAGNOSIS — E282 Polycystic ovarian syndrome: Secondary | ICD-10-CM

## 2024-05-31 DIAGNOSIS — E66812 Obesity, class 2: Secondary | ICD-10-CM

## 2024-05-31 DIAGNOSIS — Z79899 Other long term (current) drug therapy: Secondary | ICD-10-CM

## 2024-05-31 DIAGNOSIS — F411 Generalized anxiety disorder: Secondary | ICD-10-CM

## 2024-05-31 DIAGNOSIS — R748 Abnormal levels of other serum enzymes: Secondary | ICD-10-CM

## 2024-05-31 DIAGNOSIS — F429 Obsessive-compulsive disorder, unspecified: Secondary | ICD-10-CM

## 2024-05-31 MED ORDER — CLOMIPRAMINE HCL 25 MG PO CAPS: 25.0000 mg | ORAL_CAPSULE | Freq: Every day | ORAL | 1 refills | Status: DC

## 2024-05-31 MED ORDER — ARIPIPRAZOLE 10 MG PO TABS: 10.0000 mg | ORAL_TABLET | Freq: Every day | ORAL | 1 refills | Status: DC

## 2024-05-31 NOTE — Progress Notes (Signed)
 BH MD/PA/NP OP Progress Note  06/04/2024 2:10 PM Valerie [name]  MRN:  [id number] Virtual Visit via Video Note  I connected with Valerie [name] on 06/04/24 at  4:00 PM EDT by a video enabled telemedicine application and verified that I am speaking with the correct person using two identifiers.  Location: Patient: Home  Provider: [name]   I discussed the limitations of evaluation and management by telemedicine and the availability of in person appointments. The patient expressed understanding and agreed to proceed.   History of Present Illness:See EPIC note    Observations/Objective:See EPIC note   Assessment and Plan:See EPIC note   Follow Up Instructions:See EPIC note    I discussed the assessment and treatment plan with the patient. The patient was provided an opportunity to ask questions and all were answered. The patient agreed with the plan and demonstrated an understanding of the instructions.   The patient was advised to call back or seek an in-person evaluation if the symptoms worsen or if the condition fails to improve as anticipated.  I provided 20 minutes of non-face-to-face time during this encounter.   Valerie Emmer, PA-C   Chief Complaint:  Chief Complaint  Patient presents with   Follow-up   Medication Refill   Schizophrenia   OCD   Anxiety   HPI: Valerie [name] returns for her 6 month for Schizoaffective disorder with associated OCD traits and medication management.She has had no further episodes of psychosis since restarting her medication (Abilify ) that she stopped on her own in February of 2023  She continues to be symptom free and driving as well as working at [name].  She is addressing her weight issue with [facility] and reports (smiling) that she has lost 5lbs She continues to have no complaints related to her medications Her Mother is with her in the room today  Visit Diagnosis:    ICD-10-CM   1. Other schizoaffective disorders (HCC)  F25.8     2.  Obsessive-compulsive disorder, unspecified type  F42.9     3. GAD (generalized anxiety disorder)  F41.1     4. Steatohepatitis  K75.81     5. Medication management  Z79.899     6. Class 2 obesity without serious comorbidity with body mass index (BMI) of 35.0 to 35.9 in adult, unspecified obesity type  E66.812    Z68.35     7. Prediabetes  R73.03     8. PCOS (polycystic ovarian syndrome)  E28.2     9. Elevated liver enzymes  R74.8       Past Psychiatric History:  OUTPATIENT 03/20/2015 to Present  [facility] Patient and father noted that 1st episode began in November with a few days of irritability, anxiety, depression, and anger. This resolved.  She has had two more severe episodes since then between February and now where she is irritable, short tempered, appears to be responding to internal stimulation, reporting auditory hallucinations, cursing at family. Her grades have dropped from As and Bs to possibly needing Summer school.  Followed from 03/20/2015 to 02/24/2017 when she appeared in complete remission Returned to [facility] OP aftercare for recurrence 05/2017 to present INPATIENT Date of Admission:  06/02/2017 Date of Discharge: 06/10/2017 Discharge Diagnoses:   Diagnosis Date Noted   Psychosis [F29] 06/03/2017   MDD (major depressive disorder), recurrent episode, severe (HCC) [email or web address]      Past Medical History:  CARE EVERYWHERE  [facility] 06/30/2 Reason for Referral - Ultrasound (Routine) - Closed Specialty  Diagnoses / Procedures Referred By Contact Referred To Contact  Cardiology Diagnoses  Chest pain, unspecified type    Procedures  Echo Stress Exercise W Dop Comp and Clr Flw WO Enh Agnt  PR ECHO TTHRC R-T 2D W/WO M-MODE REST&STRS CONT ECG  Ruoff, Valerie BROCKS, PA-C  [address]  [address]  [place], [place] [address]  Phone: tel:[phone]  fax:[phone]  [facility]   [facility]  04/23/24   [facility] PHYSICAL-FEMALE   ECG 12 lead Impression Sinus rhythm 94 bpm without ischemic changes  [facility] : 05/23/24  Echo Stress Exercise W Dop Comp and Clr Flw WO Enh Agnt  Anatomical Region Laterality Modality  Chest -- Ultrasound   Impression  Left Ventricle: The left ventricular systolic function at rest is 60-65%. Systolic function is normal. Quantitative analysis of left ventricular Global Longitudinal Strain (GLS) imaging is -20.600%, consistent with normal strain.  The strain pattern is normal.   Stress ECG: 1.  No ST changes with exercise. 2.  No angina. 3.  No arrhythmia. 4.  Suboptimal exercise capacity for her age. 5.  Normal blood pressure response. 6.  She achieved 104% of the age-predicted maximum heart rate.   Post-stress: The post-stress echo showed normal wall motion.   Post-stress Impression: The ECG and Echo portions of the stress study are concordant with no evidence of inducible myocardial ische [facility] Outside Information Office Visit 04/23/2024 [facility] Valerie [name] - 28 y.o. Female; born [birth year] Encounter Summary , generated on Aug. 07, 2025August 07, 2025  Reason for Referral  Consultation (Routine) - Closed Reason for Referral - Consultation (Routine) - Closed Specialty Diagnoses / Procedures Referred By Contact Referred To Contact  Neurology Diagnoses  Disordered sleep  Excessive daytime sleepiness       Past Medical History:  Diagnosis Date   Anxiety    Depression    Fatigue    Headache    Psychosis (HCC) 06/03/2017   History reviewed. No pertinent surgical history.  Family Psychiatric History:  Mental illness Maternal Uncle     Family History:  [facility] Medical History Relation Name Comments  Stroke Maternal Grandfather      Asthma Maternal Grandmother      Mental illness Maternal Uncle      Diabetes Paternal Grandmother      Hypertension  Paternal Grandmother        Social History:  Socioeconomic History   Marital status: Single      Spouse name: NA   Number of children: None   Years of education: 12   Highest education level: Some Web designer History   [employer] at [name]  Tobacco Use   Smoking status: Never   Smokeless tobacco: Never  Vaping Use   Vaping status: Never Used  Substance and Sexual Activity   Alcohol use: No   Drug use: No   Sexual activity: Not Currently      Birth control/protection: Pill, Abstinence  Other Topics Concern   NA  Social History Narrative   Not on file    Social Determinants of Health           Financial Resource Strain: Low Risk  (05/27/2023)    Received from [facility]    Overall Financial Resource Strain (CARDIA)     Difficulty of Paying Living Expenses: Not hard at all  Food Insecurity: Unknown (05/27/2023)    Received from [facility]  Hunger Vital Sign     Worried About Running Out of Food in the Last Year: Never true     Ran Out of Food in the Last Year: Patient declined  Transportation Needs: Unknown (05/27/2023)    Received from [facility]    PRAPARE - Transportation     Lack of Transportation (Medical): Patient declined     Lack of Transportation (Non-Medical): No  Physical Activity: Unknown (05/27/2023)    Received from [facility]    Exercise Vital Sign     Days of Exercise per Week: Patient declined     Minutes of Exercise per Session: 60 min  Stress: Patient Declined (05/27/2023)    Received from [facility] - Occupational Stress Questionnaire     Feeling of Stress : Patient declined  Social Connections: Socially Integrated (05/27/2023)    [facility] from [facility]    Social Network     How would you rate your social network (family, work, friends)?: Good participation with social networks      Allergies:  Allergies  Allergen Reactions   Sulfa Antibiotics Hives and Itching     Hives on face    Metabolic Disorder Labs: Lab Results  Component Value Date   ComponentHemoglobin A1c   04/24/2511/21/2312/10/2202/19/16 6.1 Abnormal 5.9 High  5.65.3            Lab Results  Component Value Date   PROLACTIN 79.1 (H) 06/04/2017   Component 04/23/24 10/14/22 09/24/21 09/14/19 02/11/15  Cholesterol, Total 208 High  242 High  183 220 High  203 High   Triglycerides 287 High  253 High  164 High  323 High  107 High   HDL 55 55 65 -- 73   VLDL Cholesterol Cal 49 High  45 High  28 -- 21  LDL 104 High  142 High  90 -- 109   Lab Results  Component Value Date   TSH 2.863 06/03/2017   Component 04/23/24 09/23/22 09/24/21 12/10/14  TSH 1.370 2.250 1.890 1.48    Therapeutic Level Labs:NA   Current Medications: Current Outpatient Medications  Medication Sig Dispense Refill   drospirenone-ethinyl estradiol  (YAZ) 3-0.02 MG tablet Take 1 tablet by mouth daily.     ARIPiprazole  (ABILIFY ) 10 MG tablet Take 1 tablet (10 mg total) by mouth daily. 90 tablet 1   ascorbic acid (VITAMIN C) 1000 MG tablet Take by mouth.     Calcium Citrate-Vitamin D 315-250 MG-UNIT TABS Take by mouth.     clomiPRAMINE  (ANAFRANIL ) 25 MG capsule Take 1 capsule (25 mg total) by mouth at bedtime. 90 capsule 1   ketoconazole (NIZORAL) 2 % cream      metroNIDAZOLE (METROGEL) 0.75 % vaginal gel Insert 1 applicator vaginally every night x 5 nights     norgestimate -ethinyl estradiol  (ORTHO-CYCLEN) 0.25-35 MG-MCG tablet Take 1 tablet by mouth daily.     Omega-3 Fatty Acids (FISH OIL) 1000 MG CAPS Take by mouth.     tiZANidine (ZANAFLEX) 2 MG tablet Take 1-2 tablets at night as needed by mouth before bed for back pain or muscle spasms     No current facility-administered medications for this visit.    Musculoskeletal: Strength & Muscle Tone: Telepsych visit-Grossly normal Musculoskeletal and cranial nerve inspections Gait & Station: NA Patient leans: N/A  Psychiatric Specialty Exam: Review of  Systems  Constitutional:  Positive for activity change and appetite change (following Nutrition ed now). Negative for chills, diaphoresis, fatigue, fever and  unexpected weight change (Lost 5 lbs since Cardiac workup).  HENT:  Negative for congestion, dental problem, ear discharge, ear pain, facial swelling, hearing loss, mouth sores, nosebleeds, postnasal drip, rhinorrhea, sinus pressure, sinus pain, sneezing, sore throat, tinnitus and trouble swallowing.   Respiratory:  Negative for apnea, cough, choking, chest tightness, shortness of breath, wheezing and stridor.   Cardiovascular:  Positive for palpitations (see Care Everywhere). Negative for chest pain and leg swelling.  Endocrine: Negative for cold intolerance, heat intolerance, polydipsia, polyphagia and polyuria.  Genitourinary: Negative.  Negative for decreased urine volume, difficulty urinating, dyspareunia, dysuria, enuresis, flank pain, frequency, genital sores, hematuria, menstrual problem, pelvic pain, urgency, vaginal bleeding, vaginal discharge and vaginal pain.  Musculoskeletal:  Negative for arthralgias, back pain, gait problem, joint swelling, myalgias, neck pain and neck stiffness.  Skin:  Negative for color change, pallor, rash and wound.  Neurological:  Negative for dizziness, tremors, seizures, syncope, facial asymmetry, speech difficulty, weakness, light-headedness, numbness and headaches.  Psychiatric/Behavioral:  Positive for sleep disturbance (improved since cardiac eval). Negative for agitation, behavioral problems, confusion, decreased concentration, dysphoric mood, hallucinations (remains clear), self-injury and suicidal ideas. The patient is not nervous/anxious and is not hyperactive.     There were no vitals taken for this visit.There is no height or weight on file to calculate BMI.MY CHART VISIT Last Filed Vital Signs -  05/03/2024 [facility] Vital Sign Reading Time Taken  Comments  Blood Pressure 112/84 05/03/2024 10:41 AM EDT    Pulse 106 05/03/2024 10:41 AM EDT    Temperature - -    Respiratory Rate - -    Oxygen Saturation 95% 05/03/2024 10:41 AM EDT    Inhaled Oxygen Concentration - -    Weight 95.5 kg (210 lb 9.6 oz) 05/03/2024 10:41 AM EDT    Height 154.9 cm (5' 1) 05/03/2024 10:41 AM EDT    Body Mass Index 39.79 05/03/2024 10:41 AM EDT      General Appearance: Casual and Well Groomed  Eye Contact:  Good  Speech:  Clear and Coherent  Volume:  Normal  Mood:  Euthymic  Affect:  Congruent  Thought Process:  Coherent and Goal Directed  Orientation:  Full (Time, Place, and Person)  Thought Content: WDL and Logical   Suicidal Thoughts:  No  Homicidal Thoughts:  No  Memory:  Negative  Judgement:  Other:  WDL  Insight:  Fair  Psychomotor Activity:  Normal for visit  Concentration:  Concentration: Good and Attention Span: Good  Recall:  Negative  Fund of Knowledge: WDL  Language: Good  Akathisia:  Negative  Handed:  Right  AIMS (if indicated): Grossly negative  Assets:  Desire for Improvement Financial Resources/Insurance Housing Leisure Time Resilience Social Support Transportation  ADL's:  Intact  Cognition: WNL  Sleep:  Negative   Screenings: AIMS    Flowsheet Row Office Visit from 09/06/2019 in [facility] Office Visit from 07/13/2018 in [facility] Office Visit from 01/19/2018 in [facility] Office Visit from 10/27/2017 in [facility] Ad[facility] (Discharged[facility]  AIMS Total Score 0 0 0 0 0   AUDIT    Flowsheet Row Admission (Discharged) from [facility]  Alcohol Use Disorder Identification Test Final Score  (AUDIT) 0   Flowsheet Row  ED from 12/03/2021 in [facility]  C-SSRS RISK CATEGORY No Risk     Assessment  Airica remains Psychosis free.No further reports of OCD.She is functional driving and working. She has the full support of her parents  She appears to be doing better following up on her weight problem. She did have an episode of chest pain that turned out to be benign from a cardiac standpoint though she has significant risk factors that hopefully she will continue to address with her PCP and Specialist    and Plan: Continue current Therapeutic regimine. FU 1 month to monitor her heart symptoms  Valerie Emmer, PA-C 06/04/2024, 2:10 PM

## 2024-07-05 ENCOUNTER — Encounter (HOSPITAL_COMMUNITY): Payer: Self-pay | Admitting: Medical

## 2024-07-05 ENCOUNTER — Telehealth (HOSPITAL_BASED_OUTPATIENT_CLINIC_OR_DEPARTMENT_OTHER): Admitting: Medical

## 2024-07-05 DIAGNOSIS — F258 Other schizoaffective disorders: Secondary | ICD-10-CM | POA: Diagnosis not present

## 2024-07-05 DIAGNOSIS — K7581 Nonalcoholic steatohepatitis (NASH): Secondary | ICD-10-CM

## 2024-07-05 DIAGNOSIS — F411 Generalized anxiety disorder: Secondary | ICD-10-CM | POA: Diagnosis not present

## 2024-07-05 DIAGNOSIS — F429 Obsessive-compulsive disorder, unspecified: Secondary | ICD-10-CM

## 2024-07-05 DIAGNOSIS — R748 Abnormal levels of other serum enzymes: Secondary | ICD-10-CM

## 2024-07-05 DIAGNOSIS — E282 Polycystic ovarian syndrome: Secondary | ICD-10-CM

## 2024-07-05 DIAGNOSIS — Z79899 Other long term (current) drug therapy: Secondary | ICD-10-CM

## 2024-07-05 DIAGNOSIS — Z6835 Body mass index (BMI) 35.0-35.9, adult: Secondary | ICD-10-CM

## 2024-07-05 DIAGNOSIS — R7303 Prediabetes: Secondary | ICD-10-CM

## 2024-07-05 DIAGNOSIS — E66812 Obesity, class 2: Secondary | ICD-10-CM

## 2024-07-05 MED ORDER — ARIPIPRAZOLE 10 MG PO TABS: 10.0000 mg | ORAL_TABLET | Freq: Every day | ORAL | 1 refills | Status: AC

## 2024-07-05 MED ORDER — CLOMIPRAMINE HCL 25 MG PO CAPS: 25.0000 mg | ORAL_CAPSULE | Freq: Every day | ORAL | 1 refills | Status: AC

## 2024-07-05 NOTE — Progress Notes (Signed)
 BH MD/PA/NP OP Progress Note  07/05/2024 3:08 PM Valerie [name]  MRN:  [id number] Virtual Visit via Video Note  I connected with Valerie [name] on 07/05/24 at  3:00 PM EDT by a video enabled telemedicine application and verified that I am speaking with the correct person using two identifiers.  Location: Patient: At home Provider: [facility]   I discussed the limitations of evaluation and management by telemedicine and the availability of in person appointments. The patient expressed understanding and agreed to proceed.   History of Present Illness:See EPIC note    Observations/Objective:See EPIC note   Assessment and Plan:See EPIC note   Follow Up Instructions:See EPIC note    I discussed the assessment and treatment plan with the patient. The patient was provided an opportunity to ask questions and all were answered. The patient agreed with the plan and demonstrated an understanding of the instructions.   The patient was advised to call back or seek an in-person evaluation if the symptoms worsen or if the condition fails to improve as anticipated.  I provided 20 minutes of non-face-to-face time during this encounter.   Carlin Emmer, PA-C   Chief Complaint:  Chief Complaint  Patient presents with   Follow-up   HPI: Valerie [name] returns for her 1 month to monitor her heart symptoms discussed at last visit 06/04/2024  Assessment  She did have an episode of chest pain that turned out to be benign from a cardiac standpoint though she has significant risk factors that hopefully she will continue to address with her PCP and Specialist  and Plan: Continue current Therapeutic regimine. FU 1 month to monitor her heart symptoms>  She tells me her heart and everything is is OK  CARE EVERYWHERE confirms She had a Long Term Holter (8-15 Days) Recording Only  Anatomical Region Laterality Modality  Chest -- Other   Exam End: -- Last Resulted: 06/05/24 10:05  Received From: [facility]   Result Received: 07/05/24 08:21   Long Term Holter (8-15 Days) Interp Only  Impression  1.  Normal sinus rhythm predominates recording 2.  Rare PACs PVCs are noted 3.  Sinus tachycardia is noted during daytime activity, sinus bradycardia is noted during evening hours 4.  Benign recording  Visit Diagnosis:    ICD-10-CM   1. Other schizoaffective disorders (HCC)  F25.8     2. Obsessive-compulsive disorder, unspecified type  F42.9     3. GAD (generalized anxiety disorder)  F41.1     4. Steatohepatitis  K75.81     5. Medication management  Z79.899     6. Class 2 obesity without serious comorbidity with body mass index (BMI) of 35.0 to 35.9 in adult, unspecified obesity type  E66.812    Z68.35     7. Prediabetes  R73.03     8. PCOS (polycystic ovarian syndrome)  E28.2     9. Elevated liver enzymes  R74.8       Past Psychiatric History:  OUTPATIENT 03/20/2015 to Present  [facility] Patient and father noted that 1st episode began in November with a few days of irritability, anxiety, depression, and anger. This resolved.  She has had two more severe episodes since then between February and now where she is irritable, short tempered, appears to be responding to internal stimulation, reporting auditory hallucinations, cursing at family. Her grades have dropped from As and Bs to possibly needing Summer school.  Followed from 03/20/2015 to 02/24/2017 when she appeared in complete  remission Returned to [facility] OP aftercare for recurrence 05/2017 to present INPATIENT Date of Admission:  06/02/2017 Date of Discharge: 06/10/2017 Discharge Diagnoses:   Diagnosis Date Noted   Psychosis [F29] 06/03/2017   MDD (major depressive disorder), recurrent episode, severe (HCC) [email or web address]       Past Medical History:  [facility] reviewed See HPI  Past Medical History:  Diagnosis Date   Anxiety    Depression    Fatigue    Headache    Psychosis (HCC)  06/03/2017   No past surgical history on file.  Family Psychiatric History:  Mental illness Maternal Uncle    Family History:  NOV[place] Medical History Relation Name Comments  Stroke Maternal Grandfather      Asthma Maternal Grandmother      Mental illness Maternal Uncle      Diabetes Paternal Grandmother      Hypertension Paternal Grandmother        Social History:  Social History   Socioeconomic History   Marital status: Single      Spouse name: NA   Number of children: None   Years of education: 12   Highest education level: Some Quarry manager at [employer]  Tobacco Use   Smoking status: Never   Smokeless tobacco: Never  Vaping Use   Vaping status: Never Used  Substance and Sexual Activity   Alcohol use: No   Drug use: No   Sexual activity: Not Currently      Birth control/protection: Pill, Abstinence  Other Topics Concern   NA  Social History Narrative   Not on file    Social Determinants of Health           Financial Resource Strain: Low Risk  (05/27/2023)    Received from [facility]    Overall Financial Resource Strain (CARDIA)     Difficulty of Paying Living Expenses: Not hard at all  Food Insecurity: Unknown (05/27/2023)    Received from [facility]    Hunger Vital Sign     Worried About Running Out of Food in the Last Year: Never true     Ran Out of Food in the Last Year: Patient declined  Transportation Needs: Unknown (05/27/2023)    Received from [facility]    PRAPARE - Transportation     Lack of Transportation (Medical): Patient declined     Lack of Transportation (Non-Medical): No  Physical Activity: Unknown (05/27/2023)    Received from [facility]    Exercise Vital Sign     Days of Exercise per Week: Patient declined     Minutes of Exercise per Session: 60 min  Stress: Patient Declined (05/27/2023)    Received from [facility] - Occupational Stress Questionnaire      Feeling of Stress : Patient declined  Social Connections: Socially Integrated (05/27/2023)    Received from [facility]    Social Network     How would you rate your social network (family, work, friends)?: Good participation with social networks        Allergies:  Allergies  Allergen Reactions   Sulfa Antibiotics Hives and Itching    Hives on face    Metabolic Disorder Labs: Lab Results  Component Value Date   HGBA1C 5.3 06/03/2017   MPG 105.41 06/03/2017   Lab Results  Component Value Date   PROLACTIN 79.1 (H) 06/04/2017   Lab Results  Component Value Date  CHOL 172 (H) 06/04/2017   TRIG 144 06/04/2017   HDL 69 06/04/2017   CHOLHDL 2.5 06/04/2017   VLDL 29 06/04/2017   LDLCALC 74 06/04/2017   Lab Results  Component Value Date   TSH 2.863 06/03/2017    Therapeutic Level Labs:NA   Current Medications: Current Outpatient Medications  Medication Sig Dispense Refill   ARIPiprazole  (ABILIFY ) 10 MG tablet Take 1 tablet (10 mg total) by mouth daily. 90 tablet 1   ascorbic acid (VITAMIN C) 1000 MG tablet Take by mouth.     Calcium Citrate-Vitamin D 315-250 MG-UNIT TABS Take by mouth.     clomiPRAMINE  (ANAFRANIL ) 25 MG capsule Take 1 capsule (25 mg total) by mouth at bedtime. 90 capsule 1   drospirenone-ethinyl estradiol  (YAZ) 3-0.02 MG tablet Take 1 tablet by mouth daily.     ketoconazole (NIZORAL) 2 % cream      metroNIDAZOLE (METROGEL) 0.75 % vaginal gel Insert 1 applicator vaginally every night x 5 nights     norgestimate -ethinyl estradiol  (ORTHO-CYCLEN) 0.25-35 MG-MCG tablet Take 1 tablet by mouth daily.     Omega-3 Fatty Acids (FISH OIL) 1000 MG CAPS Take by mouth.     tiZANidine (ZANAFLEX) 2 MG tablet Take 1-2 tablets at night as needed by mouth before bed for back pain or muscle spasms     No current facility-administered medications for this visit.    Musculoskeletal: Strength & Muscle Tone: Telepsych visit-Grossly normal Musculoskeletal and cranial  nerve inspections Gait & Station: NA Patient leans: N/A  Psychiatric Specialty Exam: Review of Systems  Constitutional:  Negative for activity change, appetite change, chills, diaphoresis, fatigue, fever and unexpected weight change.  Cardiovascular:  Negative for chest pain, palpitations and leg swelling.  Endocrine: Negative for cold intolerance, heat intolerance, polydipsia, polyphagia and polyuria.  Genitourinary:  Negative for decreased urine volume, difficulty urinating, dyspareunia, dysuria, enuresis, flank pain, frequency, genital sores, hematuria, menstrual problem, pelvic pain, urgency, vaginal bleeding, vaginal discharge and vaginal pain.  Neurological:  Negative for dizziness, tremors, seizures, syncope, facial asymmetry, speech difficulty, weakness, light-headedness, numbness and headaches.  Psychiatric/Behavioral:  Negative for agitation, behavioral problems, confusion, decreased concentration, dysphoric mood, hallucinations, self-injury, sleep disturbance and suicidal ideas. The patient is not nervous/anxious and is not hyperactive.   All other systems reviewed and are negative.   There were no vitals taken for this visit.There is no height or weight on file to calculate BMI.MY CHART VISIT  General Appearance: Casual and Overweight  Eye Contact:  Good  Speech:  Clear and Coherent and Normal Rate  Volume:  Normal  Mood:  Euphoric  Affect:  Appropriate and Congruent  Thought Process:  Coherent  Orientation:  Full (Time, Place, and Person)  Thought Content: WDL, NO Delusions, and Hallucinations: {   Suicidal Thoughts:  No  Homicidal Thoughts:  No  Memory:  Negative  Judgement:  Intact  Insight:  Present  Psychomotor Activity:  Normal  Concentration:  Concentration: Good and Attention Span: Good  Recall:  Negative  Fund of Knowledge: WDL  Language: Bilingual/Fluent  Akathisia:  No  Handed:  Right  AIMS (if indicated): NA  Assets:  Desire for Improvement Financial  Resources/Insurance Housing Leisure Time Resilience Social Support Transportation Vocational/Educational  ADL's:  Intact  Cognition: WNL  Sleep:  Negative   Screenings: Geneticist, molecular Office Visit from 09/06/2019 in [facility] Office Visit from 07[facility]13/2018 in [facility] Office Visit  from 01/19/2018 in [facility] Office Visit from 10/27/2017 in [facility] Ad[facility] (Discharged) [facility]  AIMS Total Score 0 0 0 0 0   AUDIT    Flowsheet Row Admission (Discharged) from [facility]  Alcohol Use Disorder Identification Test Final Score (AUDIT) 0   Flowsheet Row ED from 12/03/2021 in [facility]  C-SSRS RISK CATEGORY No Risk     Assessment  Cardiac issues benign. Psychiatric meds do not appear to be involved Remains stable and functional    and Plan: FU 5 months  Collaboration of Care: Collaboration of Care: Primary Care Provider AEB   and Other provider involved in patient's care AEB    Patient/Guardian was advised Release of Information must be obtained prior to any record release in order to collaborate their care with an outside provider. Patient/Guardian was advised if they have not already done so to contact the registration department to sign all necessary forms in order for us  to release information regarding their care.   Consent: Patient/Guardian gives verbal consent for treatment and assignment of benefits for services provided during this visit. Patient/Guardian expressed understanding and agreed to proceed.    Carlin Emmer, PA-C 07/05/2024, 3:08 PM

## 2024-12-13 ENCOUNTER — Telehealth (HOSPITAL_COMMUNITY): Admitting: Medical
